# Patient Record
Sex: Female | Born: 1990 | Race: White | Hispanic: No | Marital: Married | Smoking: Never smoker
Health system: Southern US, Community
[De-identification: ages and names within clinical notes are randomized; demographics above are authoritative.]

## PROBLEM LIST (undated history)

## (undated) ENCOUNTER — Inpatient Hospital Stay (HOSPITAL_COMMUNITY): Payer: Self-pay

## (undated) DIAGNOSIS — F411 Generalized anxiety disorder: Secondary | ICD-10-CM

## (undated) DIAGNOSIS — O039 Complete or unspecified spontaneous abortion without complication: Secondary | ICD-10-CM

## (undated) DIAGNOSIS — N390 Urinary tract infection, site not specified: Secondary | ICD-10-CM

## (undated) DIAGNOSIS — F329 Major depressive disorder, single episode, unspecified: Secondary | ICD-10-CM

## (undated) DIAGNOSIS — F32A Depression, unspecified: Secondary | ICD-10-CM

## (undated) HISTORY — PX: LAPAROSCOPIC GASTRIC SLEEVE RESECTION: SHX5895

## (undated) HISTORY — PX: HERNIA REPAIR: SHX51

---
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
## 2011-08-07 ENCOUNTER — Emergency Department (HOSPITAL_COMMUNITY)
Admission: EM | Admit: 2011-08-07 | Discharge: 2011-08-08 | Disposition: A | Discharge status: Home / Self Care | Payer: Medicaid Other | Attending: Emergency Medicine | Admitting: Emergency Medicine

## 2011-08-07 ENCOUNTER — Encounter (HOSPITAL_COMMUNITY): Payer: Self-pay | Admitting: *Deleted

## 2011-08-07 DIAGNOSIS — R5383 Other fatigue: Secondary | ICD-10-CM | POA: Insufficient documentation

## 2011-08-07 DIAGNOSIS — B379 Candidiasis, unspecified: Secondary | ICD-10-CM | POA: Insufficient documentation

## 2011-08-07 DIAGNOSIS — N390 Urinary tract infection, site not specified: Secondary | ICD-10-CM

## 2011-08-07 DIAGNOSIS — R10819 Abdominal tenderness, unspecified site: Secondary | ICD-10-CM | POA: Insufficient documentation

## 2011-08-07 DIAGNOSIS — N739 Female pelvic inflammatory disease, unspecified: Secondary | ICD-10-CM

## 2011-08-07 DIAGNOSIS — Z79899 Other long term (current) drug therapy: Secondary | ICD-10-CM | POA: Insufficient documentation

## 2011-08-07 DIAGNOSIS — R197 Diarrhea, unspecified: Secondary | ICD-10-CM | POA: Insufficient documentation

## 2011-08-07 DIAGNOSIS — N898 Other specified noninflammatory disorders of vagina: Secondary | ICD-10-CM | POA: Insufficient documentation

## 2011-08-07 DIAGNOSIS — R300 Dysuria: Secondary | ICD-10-CM | POA: Insufficient documentation

## 2011-08-07 DIAGNOSIS — R109 Unspecified abdominal pain: Secondary | ICD-10-CM | POA: Insufficient documentation

## 2011-08-07 DIAGNOSIS — R112 Nausea with vomiting, unspecified: Secondary | ICD-10-CM | POA: Insufficient documentation

## 2011-08-07 DIAGNOSIS — R5381 Other malaise: Secondary | ICD-10-CM | POA: Insufficient documentation

## 2011-08-07 DIAGNOSIS — N949 Unspecified condition associated with female genital organs and menstrual cycle: Secondary | ICD-10-CM | POA: Insufficient documentation

## 2011-08-07 LAB — COMPREHENSIVE METABOLIC PANEL
AST: 29 U/L (ref 0–37)
Albumin: 4 g/dL (ref 3.5–5.2)
Alkaline Phosphatase: 73 U/L (ref 39–117)
Chloride: 101 mEq/L (ref 96–112)
Potassium: 3.2 mEq/L — ABNORMAL LOW (ref 3.5–5.1)
Total Bilirubin: 0.3 mg/dL (ref 0.3–1.2)
Total Protein: 7.7 g/dL (ref 6.0–8.3)

## 2011-08-07 LAB — URINALYSIS, ROUTINE W REFLEX MICROSCOPIC
Glucose, UA: NEGATIVE mg/dL
Hgb urine dipstick: NEGATIVE
Ketones, ur: 15 mg/dL — AB
Nitrite: NEGATIVE
Protein, ur: NEGATIVE mg/dL

## 2011-08-07 LAB — CBC
Hemoglobin: 13.5 g/dL (ref 12.0–15.0)
MCHC: 33.1 g/dL (ref 30.0–36.0)
Platelets: 312 10*3/uL (ref 150–400)
RBC: 5.21 MIL/uL — ABNORMAL HIGH (ref 3.87–5.11)
WBC: 14.9 10*3/uL — ABNORMAL HIGH (ref 4.0–10.5)

## 2011-08-07 LAB — DIFFERENTIAL
Basophils Absolute: 0.1 10*3/uL (ref 0.0–0.1)
Basophils Relative: 0 % (ref 0–1)
Neutro Abs: 11.5 10*3/uL — ABNORMAL HIGH (ref 1.7–7.7)
Neutrophils Relative %: 77 % (ref 43–77)

## 2011-08-07 LAB — URINE MICROSCOPIC-ADD ON

## 2011-08-07 LAB — WET PREP, GENITAL: Trich, Wet Prep: NONE SEEN

## 2011-08-07 MED ORDER — AZITHROMYCIN 250 MG PO TABS
1000.0000 mg | ORAL_TABLET | Freq: Once | ORAL | Status: AC
  Administered 2011-08-08: 1000 mg via ORAL
  Filled 2011-08-07: qty 4

## 2011-08-07 MED ORDER — ONDANSETRON HCL 4 MG/2ML IJ SOLN
4.0000 mg | Freq: Once | INTRAMUSCULAR | Status: AC
  Administered 2011-08-07: 4 mg via INTRAVENOUS
  Filled 2011-08-07: qty 2

## 2011-08-07 MED ORDER — METRONIDAZOLE 500 MG PO TABS
2000.0000 mg | ORAL_TABLET | Freq: Once | ORAL | Status: AC
  Administered 2011-08-08: 2000 mg via ORAL
  Filled 2011-08-07: qty 4

## 2011-08-07 MED ORDER — FLUCONAZOLE 150 MG PO TABS
150.0000 mg | ORAL_TABLET | ORAL | Status: AC
  Administered 2011-08-08: 150 mg via ORAL
  Filled 2011-08-07: qty 1

## 2011-08-07 MED ORDER — DEXTROSE 5 % IV SOLN
1.0000 g | Freq: Once | INTRAVENOUS | Status: AC
  Administered 2011-08-07: 1 g via INTRAVENOUS
  Filled 2011-08-07: qty 10

## 2011-08-07 MED ORDER — SODIUM CHLORIDE 0.9 % IV BOLUS (SEPSIS)
1000.0000 mL | Freq: Once | INTRAVENOUS | Status: AC
  Administered 2011-08-07: 1000 mL via INTRAVENOUS

## 2011-08-07 NOTE — ED Provider Notes (Signed)
Medical screening examination/treatment/procedure(s) were conducted as a shared visit with non-physician practitioner(s) and myself.  I personally evaluated the patient during the encounter   Jumaane Weatherford, MD 08/07/11 2356 

## 2011-08-07 NOTE — ED Provider Notes (Signed)
I performed pelvic exam for Dr. Manus Gunning. Pt has normal external genetalia. Vaginal canal irritated, white cottage cheese like discharge. White cervical discharge.  CMT. Cervix friable. Uternine tenderness, right adnexal tenderness.   Lottie Mussel, PA 08/07/11 2349

## 2011-08-07 NOTE — ED Provider Notes (Signed)
History     CSN: [id number]  Arrival date & time 08/07/11  2204   First MD Initiated Contact with Patient 08/07/11 2225      Chief Complaint  Patient presents with  . Abdominal Pain    (Consider location/radiation/quality/duration/timing/severity/associated sxs/prior treatment) HPI Comments: Patient presents with 5 days of lower abdominal pain, nausea, vomiting diarrhea. She states she's been looking for the past 5 days her last vomiting episode yesterday. She has seen some blood in her diarrhea. She denies any vaginal bleeding, discharge, dysuria or hematuria. She denies any fever. Her last period was last week. She has no appetite today.  The history is provided by the patient.    History reviewed. No pertinent past medical history.  Past Surgical History  Procedure Date  . Hernia repair     No family history on file.  History  Substance Use Topics  . Smoking status: Not on file  . Smokeless tobacco: Not on file  . Alcohol Use: No    OB History    Grav Para Term Preterm Abortions TAB SAB Ect Mult Living                  Review of Systems  Constitutional: Positive for activity change, appetite change and fatigue. Negative for fever.  HENT: Negative for congestion and rhinorrhea.   Respiratory: Negative for cough and shortness of breath.   Cardiovascular: Negative for chest pain.  Gastrointestinal: Positive for nausea, vomiting, abdominal pain and diarrhea.  Genitourinary: Positive for dysuria and vaginal discharge. Negative for vaginal bleeding.  Musculoskeletal: Negative for back pain.  Skin: Negative for rash.  Neurological: Negative for headaches.    Allergies  Abilify and Sulfa antibiotics  Home Medications   Current Outpatient Rx  Name Route Sig Dispense Refill  . DULOXETINE HCL 60 MG PO CPEP Oral Take 60 mg by mouth every morning.    Marland Kitchen CIPROFLOXACIN HCL 500 MG PO TABS Oral Take 1 tablet (500 mg total) by mouth every 12 (twelve) hours. 6 tablet 0  .  PHENAZOPYRIDINE HCL 200 MG PO TABS Oral Take 1 tablet (200 mg total) by mouth 3 (three) times daily. 6 tablet 0    BP 105/64  Pulse 87  Resp 12  SpO2 100%  LMP 07/17/2011  Physical Exam  Constitutional: She is oriented to person, place, and time. She appears well-developed and well-nourished. No distress.  HENT:  Head: Normocephalic and atraumatic.  Mouth/Throat: Oropharynx is clear and moist. No oropharyngeal exudate.  Eyes: Conjunctivae and EOM are normal. Pupils are equal, round, and reactive to light.  Neck: Normal range of motion.  Cardiovascular: Normal rate, regular rhythm and normal heart sounds.   Pulmonary/Chest: Effort normal and breath sounds normal. No respiratory distress.  Abdominal: Soft. There is tenderness. There is no rebound and no guarding.       TTP RLQ with guarding, TTP suprapubic  Musculoskeletal: Normal range of motion.  Neurological: She is alert and oriented to person, place, and time. No cranial nerve deficit.  Skin: Skin is warm.    ED Course  Procedures (including critical care time)  Labs Reviewed  URINALYSIS, ROUTINE W REFLEX MICROSCOPIC - Abnormal; Notable for the following:    APPearance CLOUDY (*)    Bilirubin Urine SMALL (*)    Ketones, ur 15 (*)    Leukocytes, UA MODERATE (*)    All other components within normal limits  WET PREP, GENITAL - Abnormal; Notable for the following:    WBC, Wet  Prep HPF POC TOO NUMEROUS TO COUNT (*)    All other components within normal limits  CBC - Abnormal; Notable for the following:    WBC 14.9 (*)    RBC 5.21 (*)    MCH 25.9 (*)    RDW 15.8 (*)    All other components within normal limits  DIFFERENTIAL - Abnormal; Notable for the following:    Neutro Abs 11.5 (*)    All other components within normal limits  COMPREHENSIVE METABOLIC PANEL - Abnormal; Notable for the following:    Potassium 3.2 (*)    All other components within normal limits  URINE MICROSCOPIC-ADD ON - Abnormal; Notable for the  following:    Squamous Epithelial / LPF FEW (*)    Bacteria, UA MANY (*)    Casts HYALINE CASTS (*)    All other components within normal limits  LIPASE, BLOOD  PREGNANCY, URINE  POCT PREGNANCY, URINE  GC/CHLAMYDIA PROBE AMP, GENITAL   US Transvaginal Non-ob  08/08/2011  *RADIOLOGY REPORT*  Clinical Data: Right-sided mid pelvis pain  TRANSABDOMINAL AND TRANSVAGINAL ULTRASOUND OF PELVIS Technique:  Both transabdominal and transvaginal ultrasound examinations of the pelvis were performed. Transabdominal technique was performed for global imaging of the pelvis including uterus, ovaries, adnexal regions, and pelvic cul-de-sac.  Comparison: None.   It was necessary to proceed with endovaginal exam following the transabdominal exam to visualize the endometrium and adnexa.  Findings:  Uterus: Normal sonographic appearance measuring 8.0 x 3.8 x 5.1 cm.  Endometrium: Measures 1.4 cm in thickness, within normal limits.  Right ovary:  Measures 3.1 x 2.0 x 2.7 cm.  Physiologic cyst.  Left ovary: Measures 2.8 x 1.2 x 1.6 cm.  Normal sonographic appearance.  Other findings: Small amount of free fluid within the left adnexa. This is most often a physiologic finding.  Color Doppler flow documented to both ovaries.  IMPRESSION: Normal study. No evidence of pelvic mass or other significant abnormality.  Original Report Authenticated By: Waneta Martins, M.D.   US Pelvis Complete  08/08/2011  *RADIOLOGY REPORT*  Clinical Data: Right-sided mid pelvis pain  TRANSABDOMINAL AND TRANSVAGINAL ULTRASOUND OF PELVIS Technique:  Both transabdominal and transvaginal ultrasound examinations of the pelvis were performed. Transabdominal technique was performed for global imaging of the pelvis including uterus, ovaries, adnexal regions, and pelvic cul-de-sac.  Comparison: None.   It was necessary to proceed with endovaginal exam following the transabdominal exam to visualize the endometrium and adnexa.  Findings:  Uterus: Normal  sonographic appearance measuring 8.0 x 3.8 x 5.1 cm.  Endometrium: Measures 1.4 cm in thickness, within normal limits.  Right ovary:  Measures 3.1 x 2.0 x 2.7 cm.  Physiologic cyst.  Left ovary: Measures 2.8 x 1.2 x 1.6 cm.  Normal sonographic appearance.  Other findings: Small amount of free fluid within the left adnexa. This is most often a physiologic finding.  Color Doppler flow documented to both ovaries.  IMPRESSION: Normal study. No evidence of pelvic mass or other significant abnormality.  Original Report Authenticated By: Waneta Martins, M.D.   Ct Abdomen Pelvis W Contrast  08/08/2011  *RADIOLOGY REPORT*  Clinical Data: Mid/lower abdominal pain.  CT ABDOMEN AND PELVIS WITH CONTRAST  Technique:  Multidetector CT imaging of the abdomen and pelvis was performed following the standard protocol during bolus administration of intravenous contrast.  Contrast: 40mL OMNIPAQUE IOHEXOL 300 MG/ML IV SOLN, OMNIPAQUE IOHEXOL 300 MG/ML IV SOLN  Comparison: 08/08/2011 ultrasound  Findings: Limited images through the lung bases  demonstrate no significant appreciable abnormality. The heart size is within normal limits. No pleural or pericardial effusion.  Unremarkable liver, biliary system, pancreas, adrenal glands. There is a 3.5 cm cyst within the lower pole of the spleen, measures water attenuation.  Numerous additional smaller hypodensities within the splenic dome are too small to further characterize.  Symmetric renal enhancement.  No hydronephrosis or hydroureter.  No urinary tract calculi identified.  No bowel obstruction. Sigmoid colon is mildly thickened however decompressed, limiting evaluation. While normally I would favor this to represent an incidental finding, there are a couple reactive size mesorectal lymph nodes nearby. Otherwise, no CT evidence for colitis. No free intraperitoneal air.  Normal caliber vasculature.  Corpus luteal cyst noted on the right.  Trace free fluid within the pelvis.  Minimal fat stranding abuts the left adnexa (series 2 image 74).  CT appearance to the uterus and adnexa otherwise within normal limits.  Partially decompressed bladder.  No acute osseous abnormality.  IMPRESSION: Mild sigmoid colonic wall thickening is nonspecific given incomplete distension. Correlate clinically if concern for a mild colitis.  Minimal fat stranding abutting the left adnexa. No hydrosalpinx or loculated fluid collection.  Otherwise, no acute CT abnormality.  Original Report Authenticated By: Waneta Martins, M.D.     1. Pelvic inflammatory disease   2. Urinary tract infection   3. Candidiasis       MDM  Lower abdominal pain with nausea, vomiting diarrhea. No history of fever or chills. Patient does endorse anorexia. Abdomen is soft but tender in the right lower quadrant suprapubic area  Pelvic exam performed by [facility].  See her separate note.  Empiric treatment for PID, UTI, candidiasis.  CT abdomen to evaluate appendix and TVUS to evaluate R ovary.  Signed out to Dr. Nino Parsley at shift change.        Glynn Octave, MD 08/08/11 1146

## 2011-08-07 NOTE — ED Notes (Signed)
Pt is here for evaluation of abdominal pain and n/v and diarrhea for 5 days.  Pt would like to be checked for a yeast infection also, she reports swelling and redness in vaginal area.  Pt denies any pain or burning with urination.  Pt denies any fever or chills.

## 2011-08-08 ENCOUNTER — Emergency Department (HOSPITAL_COMMUNITY): Payer: Medicaid Other

## 2011-08-08 MED ORDER — PHENAZOPYRIDINE HCL 200 MG PO TABS: 200.0000 mg | ORAL_TABLET | Freq: Three times a day (TID) | ORAL | Status: AC

## 2011-08-08 MED ORDER — IOHEXOL 300 MG/ML  SOLN
40.0000 mL | Freq: Once | INTRAMUSCULAR | Status: AC | PRN
  Administered 2011-08-08: 40 mL via ORAL

## 2011-08-08 MED ORDER — IOHEXOL 300 MG/ML  SOLN
20.0000 mL | INTRAMUSCULAR | Status: AC
Start: 2011-08-08 — End: 2011-08-08

## 2011-08-08 MED ORDER — IOHEXOL 300 MG/ML  SOLN
100.0000 mL | Freq: Once | INTRAMUSCULAR | Status: AC | PRN
  Administered 2011-08-08: 100 mL via INTRAVENOUS

## 2011-08-08 MED ORDER — CIPROFLOXACIN HCL 500 MG PO TABS: 500.0000 mg | ORAL_TABLET | Freq: Two times a day (BID) | ORAL | Status: AC

## 2011-08-08 NOTE — ED Notes (Signed)
Pt is in ultrasound at this time

## 2011-08-08 NOTE — ED Provider Notes (Addendum)
Assumed care from Dr. Manus Gunning.  Need to check ct and [place] and then reassess pt. She has been txd for pid and uti.   Nicholes Stairs, MD 08/08/11 0036  Reevaluated pt. Comfortable.  No pain.  Explained findings and plan. She understands and agrees.  Does not need note for work or school.  Nicholes Stairs, MD 08/08/11 [phone]

## 2011-10-21 ENCOUNTER — Emergency Department (HOSPITAL_COMMUNITY): Payer: Medicaid Other

## 2011-10-21 ENCOUNTER — Encounter (HOSPITAL_COMMUNITY): Payer: Self-pay | Admitting: *Deleted

## 2011-10-21 ENCOUNTER — Emergency Department (HOSPITAL_COMMUNITY)
Admission: EM | Admit: 2011-10-21 | Discharge: 2011-10-21 | Disposition: A | Discharge status: Home / Self Care | Payer: Medicaid Other | Attending: Emergency Medicine | Admitting: Emergency Medicine

## 2011-10-21 DIAGNOSIS — R112 Nausea with vomiting, unspecified: Secondary | ICD-10-CM | POA: Insufficient documentation

## 2011-10-21 DIAGNOSIS — R109 Unspecified abdominal pain: Secondary | ICD-10-CM | POA: Insufficient documentation

## 2011-10-21 DIAGNOSIS — Z331 Pregnant state, incidental: Secondary | ICD-10-CM

## 2011-10-21 LAB — CBC
HCT: 38 % (ref 36.0–46.0)
Hemoglobin: 12.8 g/dL (ref 12.0–15.0)
MCH: 27.1 pg (ref 26.0–34.0)
MCHC: 33.7 g/dL (ref 30.0–36.0)
MCV: 80.3 fL (ref 78.0–100.0)
RBC: 4.73 MIL/uL (ref 3.87–5.11)

## 2011-10-21 LAB — BASIC METABOLIC PANEL
BUN: 6 mg/dL (ref 6–23)
CO2: 24 mEq/L (ref 19–32)
Calcium: 9.4 mg/dL (ref 8.4–10.5)
Creatinine, Ser: 0.63 mg/dL (ref 0.50–1.10)
GFR calc non Af Amer: >90 mL/min (ref >90)
Glucose, Bld: 73 mg/dL (ref 70–99)
Sodium: 137 mEq/L (ref 135–145)

## 2011-10-21 LAB — RH IG WORKUP (INCLUDES ABO/RH)
ABO/RH(D): O NEG
Antibody Screen: NEGATIVE
Unit division: 0

## 2011-10-21 LAB — URINALYSIS, ROUTINE W REFLEX MICROSCOPIC
Bilirubin Urine: NEGATIVE
Glucose, UA: NEGATIVE mg/dL
Ketones, ur: NEGATIVE mg/dL
Leukocytes, UA: NEGATIVE
Protein, ur: NEGATIVE mg/dL

## 2011-10-21 MED ORDER — ONDANSETRON 8 MG PO TBDP
8.0000 mg | ORAL_TABLET | Freq: Three times a day (TID) | ORAL | Status: AC | PRN
Start: 2011-10-21 — End: 2011-10-28

## 2011-10-21 NOTE — ED Notes (Signed)
To ED for eval of lower abd pain and vomiting for the past cple of weeks. C/o vaginal discharge.

## 2011-10-21 NOTE — ED Provider Notes (Addendum)
History     CSN: [id number]  Arrival date & time 10/21/11  1711   First MD Initiated Contact with Patient 10/21/11 1741      Chief Complaint  Patient presents with  . Abdominal Pain    (Consider location/radiation/quality/duration/timing/severity/associated sxs/prior treatment) The history is provided by the patient.   the patient reports intermittent lower abdominal pain for several weeks.  She's had nausea and several episodes of vomiting.  She called her OB/GYN for followup today and her OB/GYN said she would not evaluate her for abdominal pain.  Chest the patient to be evaluated in the emergency department if she was having symptoms.  She's not checked a pregnancy test at home.  She denies diarrhea.  This time she has no abdominal pain.  She reports her last normal menstrual period was 09/15/2011.  Since then she's had no abnormal vaginal bleeding.  Her pain is mild to moderate when it comes on at this time her pain is 0.  Denies dysuria or urinary frequency.  She reports a subtle change in her typical vaginal discharge  History reviewed. No pertinent past medical history.  Past Surgical History  Procedure Date  . Hernia repair     History reviewed. No pertinent family history.  History  Substance Use Topics  . Smoking status: Not on file  . Smokeless tobacco: Not on file  . Alcohol Use: No    OB History    Grav Para Term Preterm Abortions TAB SAB Ect Mult Living                  Review of Systems  Gastrointestinal: Positive for abdominal pain.  All other systems reviewed and are negative.    Allergies  Abilify and Sulfa antibiotics  Home Medications   Current Outpatient Rx  Name Route Sig Dispense Refill  . SERTRALINE HCL 25 MG PO TABS Oral Take 25 mg by mouth 2 (two) times daily.      BP 112/60  Pulse 96  Temp(Src) 98 F (36.7 C) (Oral)  Resp 16  SpO2 99%  Physical Exam  Nursing note and vitals reviewed. Constitutional: She is oriented to person,  place, and time. She appears well-developed and well-nourished. No distress.  HENT:  Head: Normocephalic and atraumatic.  Eyes: EOM are normal.  Neck: Normal range of motion.  Cardiovascular: Normal rate, regular rhythm and normal heart sounds.   Pulmonary/Chest: Effort normal and breath sounds normal.  Abdominal: Soft. She exhibits no distension. There is no tenderness.  Musculoskeletal: Normal range of motion.  Neurological: She is alert and oriented to person, place, and time.  Skin: Skin is warm and dry.  Psychiatric: She has a normal mood and affect. Judgment normal.    ED Course  Procedures (including critical care time)  Labs Reviewed  URINALYSIS, ROUTINE W REFLEX MICROSCOPIC - Abnormal; Notable for the following:    Specific Gravity, Urine 1.031 (*)    All other components within normal limits  POCT PREGNANCY, URINE - Abnormal; Notable for the following:    Preg Test, Ur POSITIVE (*)    All other components within normal limits  CBC - Abnormal; Notable for the following:    RDW 15.7 (*)    All other components within normal limits  HCG, QUANTITATIVE, PREGNANCY - Abnormal; Notable for the following:    hCG, Beta Chain, Quant, S 562 (*)    All other components within normal limits  BASIC METABOLIC PANEL  ABO/RH  RH IG WORKUP  US Ob Comp Less 14 Wks  10/21/2011  *RADIOLOGY REPORT*  Clinical Data: Pelvic pain, positive pregnancy test  OBSTETRIC <14 WK [place] AND TRANSVAGINAL OB US  Technique:  Both transabdominal and transvaginal ultrasound examinations were performed for complete evaluation of the gestation as well as the maternal uterus, adnexal regions, and pelvic cul-de-sac.  Transvaginal technique was performed to assess early pregnancy.  Comparison:  Pelvic ultrasound 08/08/2011, CT abdomen/pelvis 08/08/2011  Intrauterine gestational sac:  Not visualized Yolk sac: Not visualized Embryo: Not visualized Cardiac Activity: Not visualized  Maternal uterus/adnexae: The ovaries are  normal.  No free fluid.  IMPRESSION: No intrauterine gestational sac, yolk sac, fetal pole, or cardiac activity visualized. Differential considerations include intrauterine gestation too early to be sonographically visualized, spontaneous abortion, or ectopic pregnancy.  Consider follow-up ultrasound in 10 days and serial quantitative beta HCG follow-up.  Original Report Authenticated By: Harrel Lemon, M.D.   US Ob Transvaginal  10/21/2011  *RADIOLOGY REPORT*  Clinical Data: Pelvic pain, positive pregnancy test  OBSTETRIC <14 WK [place] AND TRANSVAGINAL OB US  Technique:  Both transabdominal and transvaginal ultrasound examinations were performed for complete evaluation of the gestation as well as the maternal uterus, adnexal regions, and pelvic cul-de-sac.  Transvaginal technique was performed to assess early pregnancy.  Comparison:  Pelvic ultrasound 08/08/2011, CT abdomen/pelvis 08/08/2011  Intrauterine gestational sac:  Not visualized Yolk sac: Not visualized Embryo: Not visualized Cardiac Activity: Not visualized  Maternal uterus/adnexae: The ovaries are normal.  No free fluid.  IMPRESSION: No intrauterine gestational sac, yolk sac, fetal pole, or cardiac activity visualized. Differential considerations include intrauterine gestation too early to be sonographically visualized, spontaneous abortion, or ectopic pregnancy.  Consider follow-up ultrasound in 10 days and serial quantitative beta HCG follow-up.  Original Report Authenticated By: Harrel Lemon, M.D.     1. Nausea and vomiting   2. Pregnancy as incidental finding   3. Abdominal pain       MDM  Patient is a positive urine pregnancy test.  Blood work will be obtained.  IV placed.  She'll obtain an ultrasound to evaluate location of pregnancy.  She has no vaginal bleeding.  Her vital signs are normal.  My suspicion for an ectopic is low.  7:54 PM The pt feels better. No vaginal bleeding. Quant is 562.   [place] indeterminate. Close gyn  follow up. Strict early ectopic precautions given. She understands to return to [facility] hospital for development of worsening abdominal pain or development of vaginal bleeding or lightheadedness or syncope.      Lyanne Co, MD 10/21/11 1821  Lyanne Co, MD 10/21/11 Corky Crafts

## 2011-10-21 NOTE — ED Notes (Signed)
Patient verbalized complete understanding of all the d/c home instructions 

## 2011-10-21 NOTE — Discharge Instructions (Signed)
Abdominal Pain During Pregnancy °Belly (abdominal) pain is common during pregnancy. Most of the time, it is not a serious problem. Other times, it can be a sign that something is wrong with the pregnancy. Always tell your doctor if you have belly pain. °HOME CARE °For mild pain: °· Do not have sex (intercourse) or put anything in your vagina until you feel better.  °· Rest until your pain stops. If your pain lasts longer than 1 hour, call your doctor.  °· Drink clear fluids if you feel sick to your stomach (nauseous).  °· Do not eat solid food until you feel better.  °· Only take medicine as told by your doctor.  °· Keep all doctor visits as told.  °GET HELP RIGHT AWAY IF:  °· You are bleeding, leaking fluid, or pieces of tissue come out of your vagina.  °· You have more pain or cramping.  °· You keep throwing up (vomiting).  °· You have pain when you pee (urinate) or have blood in your pee.  °· You have a fever.  °· You do not feel your baby moving as much.  °· You feel very weak or feel like passing out.  °· You have trouble breathing, with or without belly pain.  °· You have a very bad headache and belly pain.  °· You have fluid leaking from your vagina and belly pain.  °· You keep having watery poop (diarrhea).  °· Your belly pain does not go away after resting, or the pain gets worse.  °MAKE SURE YOU:  °· Understand these instructions.  °· Will watch your condition.  °· Will get help right away if you are not doing well or get worse.  °Document Released: 06/05/2009 Document Revised: 06/06/2011 Document Reviewed: 01/11/2011 °[employer]® Patient Information ©2012 [employer]. °

## 2011-10-21 NOTE — ED Notes (Signed)
Patient back from US.

## 2011-11-21 ENCOUNTER — Encounter (HOSPITAL_COMMUNITY): Payer: Self-pay | Admitting: *Deleted

## 2011-11-21 ENCOUNTER — Emergency Department (HOSPITAL_COMMUNITY)
Admission: EM | Admit: 2011-11-21 | Discharge: 2011-11-21 | Disposition: A | Discharge status: Home / Self Care | Payer: Medicaid Other | Attending: Emergency Medicine | Admitting: Emergency Medicine

## 2011-11-21 DIAGNOSIS — L0291 Cutaneous abscess, unspecified: Secondary | ICD-10-CM

## 2011-11-21 DIAGNOSIS — Z711 Person with feared health complaint in whom no diagnosis is made: Secondary | ICD-10-CM | POA: Insufficient documentation

## 2011-11-21 DIAGNOSIS — N764 Abscess of vulva: Secondary | ICD-10-CM | POA: Insufficient documentation

## 2011-11-21 DIAGNOSIS — N898 Other specified noninflammatory disorders of vagina: Secondary | ICD-10-CM | POA: Insufficient documentation

## 2011-11-21 DIAGNOSIS — N390 Urinary tract infection, site not specified: Secondary | ICD-10-CM | POA: Insufficient documentation

## 2011-11-21 LAB — WET PREP, GENITAL
Trich, Wet Prep: NONE SEEN
Yeast Wet Prep HPF POC: NONE SEEN

## 2011-11-21 LAB — URINE MICROSCOPIC-ADD ON

## 2011-11-21 LAB — URINALYSIS, ROUTINE W REFLEX MICROSCOPIC
Bilirubin Urine: NEGATIVE
Glucose, UA: NEGATIVE mg/dL
Hgb urine dipstick: NEGATIVE
Ketones, ur: NEGATIVE mg/dL
pH: 7 (ref 5.0–8.0)

## 2011-11-21 MED ORDER — AZITHROMYCIN 250 MG PO TABS
1000.0000 mg | ORAL_TABLET | Freq: Once | ORAL | Status: AC
  Administered 2011-11-21: 1000 mg via ORAL
  Filled 2011-11-21: qty 4

## 2011-11-21 MED ORDER — LIDOCAINE HCL (PF) 1 % IJ SOLN
INTRAMUSCULAR | Status: AC
  Administered 2011-11-21: 20:00:00
  Filled 2011-11-21: qty 5

## 2011-11-21 MED ORDER — CEFTRIAXONE SODIUM 250 MG IJ SOLR
250.0000 mg | Freq: Once | INTRAMUSCULAR | Status: AC
  Administered 2011-11-21: 250 mg via INTRAMUSCULAR
  Filled 2011-11-21: qty 250

## 2011-11-21 MED ORDER — FLUCONAZOLE 200 MG PO TABS: 200.0000 mg | ORAL_TABLET | Freq: Every day | ORAL | Status: AC

## 2011-11-21 MED ORDER — SILVER SULFADIAZINE 1 % EX CREA
TOPICAL_CREAM | Freq: Once | CUTANEOUS | Status: AC
  Administered 2011-11-21: 1 via TOPICAL
  Filled 2011-11-21: qty 85

## 2011-11-21 MED ORDER — CIPROFLOXACIN HCL 500 MG PO TABS: 500.0000 mg | ORAL_TABLET | Freq: Two times a day (BID) | ORAL | Status: AC

## 2011-11-21 NOTE — ED Provider Notes (Signed)
Medical screening examination/treatment/procedure(s) were performed by non-physician practitioner and as supervising physician I was immediately available for consultation/collaboration.  Doug Sou, MD 11/21/11 2102

## 2011-11-21 NOTE — Discharge Instructions (Signed)
Abscess An abscess (boil or furuncle) is an infected area that contains a collection of pus.  SYMPTOMS Signs and symptoms of an abscess include pain, tenderness, redness, or hardness. You may feel a moveable soft area under your skin. An abscess can occur anywhere in the body.  TREATMENT  A surgical cut (incision) may be made over your abscess to drain the pus. Gauze may be packed into the space or a drain may be looped through the abscess cavity (pocket). This provides a drain that will allow the cavity to heal from the inside outwards. The abscess may be painful for a few days, but should feel much better if it was drained.  Your abscess, if seen early, may not have localized and may not have been drained. If not, another appointment may be required if it does not get better on its own or with medications. HOME CARE INSTRUCTIONS   Only take over-the-counter or prescription medicines for pain, discomfort, or fever as directed by your caregiver.   Take your antibiotics as directed if they were prescribed. Finish them even if you start to feel better.   Keep the skin and clothes clean around your abscess.   If the abscess was drained, you will need to use gauze dressing to collect any draining pus. Dressings will typically need to be changed 3 or more times a day.   The infection may spread by skin contact with others. Avoid skin contact as much as possible.   Practice good hygiene. This includes regular hand washing, cover any draining skin lesions, and do not share personal care items.   If you participate in sports, do not share athletic equipment, towels, whirlpools, or personal care items. Shower after every practice or tournament.   If a draining area cannot be adequately covered:   Do not participate in sports.   Children should not participate in day care until the wound has healed or drainage stops.   If your caregiver has given you a follow-up appointment, it is very important  to keep that appointment. Not keeping the appointment could result in a much worse infection, chronic or permanent injury, pain, and disability. If there is any problem keeping the appointment, you must call back to this facility for assistance.  SEEK MEDICAL CARE IF:   You develop increased pain, swelling, redness, drainage, or bleeding in the wound site.   You develop signs of generalized infection including muscle aches, chills, fever, or a general ill feeling.   You have an oral temperature above 102 F (38.9 C).  MAKE SURE YOU:   Understand these instructions.   Will watch your condition.   Will get help right away if you are not doing well or get worse.  Document Released: 03/27/2005 Document Revised: 06/06/2011 Document Reviewed: 01/19/2008 [facility] Patient Information 2012 [place].Cellulitis Cellulitis is an infection of the skin and the tissue beneath it. The area is typically red and tender. It is caused by germs (bacteria) (usually staph or strep) that enter the body through cuts or sores. Cellulitis most commonly occurs in the arms or lower legs.  HOME CARE INSTRUCTIONS   If you are given a prescription for medications which kill germs (antibiotics), take as directed until finished.   If the infection is on the arm or leg, keep the limb elevated as able.   Use a warm cloth several times per day to relieve pain and encourage healing.   See your caregiver for recheck of the infected site as  directed if problems arise.   Only take over-the-counter or prescription medicines for pain, discomfort, or fever as directed by your caregiver.  SEEK MEDICAL CARE IF:   The area of redness (inflammation) is spreading, there are red streaks coming from the infected site, or if a part of the infection begins to turn dark in color.   The joint or bone underneath the infected skin becomes painful after the skin has healed.   The infection returns in the same or another area after  it seems to have gone away.   A boil or bump swells up. This may be an abscess.   New, unexplained problems such as pain or fever develop.  SEEK IMMEDIATE MEDICAL CARE IF:   You have a fever.   You or your child feels drowsy or lethargic.   There is vomiting, diarrhea, or lasting discomfort or feeling ill (malaise) with muscle aches and pains.  MAKE SURE YOU:   Understand these instructions.   Will watch your condition.   Will get help right away if you are not doing well or get worse.  Document Released: 03/27/2005 Document Revised: 06/06/2011 Document Reviewed: 02/03/2008 [facility] Patient Information 2012 [place], [place].Urinary Tract Infection Infections of the urinary tract can start in several places. A bladder infection (cystitis), a kidney infection (pyelonephritis), and a prostate infection (prostatitis) are different types of urinary tract infections (UTIs). They usually get better if treated with medicines (antibiotics) that kill germs. Take all the medicine until it is gone. You or your child may feel better in a few days, but TAKE ALL MEDICINE or the infection may not respond and may become more difficult to treat. HOME CARE INSTRUCTIONS   Drink enough water and fluids to keep the urine clear or pale yellow. Cranberry juice is especially recommended, in addition to large amounts of water.   Avoid caffeine, tea, and carbonated beverages. They tend to irritate the bladder.   Alcohol may irritate the prostate.   Only take over-the-counter or prescription medicines for pain, discomfort, or fever as directed by your caregiver.  To prevent further infections:  Empty the bladder often. Avoid holding urine for long periods of time.   After a bowel movement, women should cleanse from front to back. Use each tissue only once.   Empty the bladder before and after sexual intercourse.  FINDING OUT THE RESULTS OF YOUR TEST Not all test results are available during your visit. If  your or your child's test results are not back during the visit, make an appointment with your caregiver to find out the results. Do not assume everything is normal if you have not heard from your caregiver or the medical facility. It is important for you to follow up on all test results. SEEK MEDICAL CARE IF:   There is back pain.   Your baby is older than 3 months with a rectal temperature of 100.5 F (38.1 C) or higher for more than 1 day.   Your or your child's problems (symptoms) are no better in 3 days. Return sooner if you or your child is getting worse.  SEEK IMMEDIATE MEDICAL CARE IF:   There is severe back pain or lower abdominal pain.   You or your child develops chills.   You have a fever.   Your baby is older than 3 months with a rectal temperature of 102 F (38.9 C) or higher.   Your baby is 9 months old or younger with a rectal temperature of 100.4 F (  38 C) or higher.   There is nausea or vomiting.   There is continued burning or discomfort with urination.  MAKE SURE YOU:   Understand these instructions.   Will watch your condition.   Will get help right away if you are not doing well or get worse.  Document Released: 03/27/2005 Document Revised: 06/06/2011 Document Reviewed: 10/30/2006 [facility] Patient Information 2012 [place], [place].Urinary Tract Infection Infections of the urinary tract can start in several places. A bladder infection (cystitis), a kidney infection (pyelonephritis), and a prostate infection (prostatitis) are different types of urinary tract infections (UTIs). They usually get better if treated with medicines (antibiotics) that kill germs. Take all the medicine until it is gone. You or your child may feel better in a few days, but TAKE ALL MEDICINE or the infection may not respond and may become more difficult to treat. HOME CARE INSTRUCTIONS   Drink enough water and fluids to keep the urine clear or pale yellow. Cranberry juice is especially  recommended, in addition to large amounts of water.   Avoid caffeine, tea, and carbonated beverages. They tend to irritate the bladder.   Alcohol may irritate the prostate.   Only take over-the-counter or prescription medicines for pain, discomfort, or fever as directed by your caregiver.  To prevent further infections:  Empty the bladder often. Avoid holding urine for long periods of time.   After a bowel movement, women should cleanse from front to back. Use each tissue only once.   Empty the bladder before and after sexual intercourse.  FINDING OUT THE RESULTS OF YOUR TEST Not all test results are available during your visit. If your or your child's test results are not back during the visit, make an appointment with your caregiver to find out the results. Do not assume everything is normal if you have not heard from your caregiver or the medical facility. It is important for you to follow up on all test results. SEEK MEDICAL CARE IF:   There is back pain.   Your baby is older than 3 months with a rectal temperature of 100.5 F (38.1 C) or higher for more than 1 day.   Your or your child's problems (symptoms) are no better in 3 days. Return sooner if you or your child is getting worse.  SEEK IMMEDIATE MEDICAL CARE IF:   There is severe back pain or lower abdominal pain.   You or your child develops chills.   You have a fever.   Your baby is older than 3 months with a rectal temperature of 102 F (38.9 C) or higher.   Your baby is 69 months old or younger with a rectal temperature of 100.4 F (38 C) or higher.   There is nausea or vomiting.   There is continued burning or discomfort with urination.  MAKE SURE YOU:   Understand these instructions.   Will watch your condition.   Will get help right away if you are not doing well or get worse.  Document Released: 03/27/2005 Document Revised: 06/06/2011 Document Reviewed: 10/30/2006 [facility] Patient Information 2012  [place], [place].

## 2011-11-21 NOTE — ED Provider Notes (Signed)
History     CSN: [id number]  Arrival date & time 11/21/11  1345   First MD Initiated Contact with Patient 11/21/11 1632      Chief Complaint  Patient presents with  . Vaginal Discharge    (Consider location/radiation/quality/duration/timing/severity/associated sxs/prior treatment) HPI  Patient presents to the ED with complaints of vaginal discharge for two weeks and scratches to her labia from scratching so much due to the vaginal discharge. She is now having puss draining from the scratches. The patient states that she has not had sex in 3 weeks after having had a miscarriage. She denies having any pain, abdominal pain, urinary symptoms, weakness, fever, nausea, vomiting for diarrhea. The patient denies having any chronic medical problems - such as diabetes.   History reviewed. No pertinent past medical history.  Past Surgical History  Procedure Date  . Hernia repair     No family history on file.  History  Substance Use Topics  . Smoking status: Never Smoker   . Smokeless tobacco: Not on file  . Alcohol Use: No    OB History    Grav Para Term Preterm Abortions TAB SAB Ect Mult Living                  Review of Systems   HEENT: denies blurry vision or change in hearing PULMONARY: Denies difficulty breathing and SOB CARDIAC: denies chest pain or heart palpitations MUSCULOSKELETAL:  denies being unable to ambulate ABDOMEN AL: denies abdominal pain GU: denies loss of bowel or urinary control NEURO: denies numbness and tingling in extremities   Allergies  Aripiprazole and Sulfa antibiotics  Home Medications   Current Outpatient Rx  Name Route Sig Dispense Refill  . SERTRALINE HCL 25 MG PO TABS Oral Take 25 mg by mouth 2 (two) times daily.    Marland Kitchen CIPROFLOXACIN HCL 500 MG PO TABS Oral Take 1 tablet (500 mg total) by mouth 2 (two) times daily. 28 tablet 0    BP 128/84  Pulse 105  Temp(Src) 98.4 F (36.9 C) (Oral)  Resp 18  SpO2 99%  LMP  11/07/2011  Physical Exam  Nursing note and vitals reviewed. Constitutional: She appears well-developed and well-nourished. No distress.  HENT:  Head: Normocephalic and atraumatic.  Eyes: Pupils are equal, round, and reactive to light.  Neck: Normal range of motion. Neck supple.  Cardiovascular: Normal rate and regular rhythm.   Pulmonary/Chest: Effort normal.  Abdominal: Soft. She exhibits no distension. There is no tenderness. There is no rebound and no guarding.  Genitourinary:    There is no tenderness or injury on the right labia. There is tenderness (to abscess) and injury (scratch) on the left labia. No erythema, tenderness or bleeding around the vagina. No foreign body around the vagina. No signs of injury around the vagina. Vaginal discharge found.  Neurological: She is alert.  Skin: Skin is warm and dry.    ED Course  Procedures (including critical care time)  Labs Reviewed  URINALYSIS, ROUTINE W REFLEX MICROSCOPIC - Abnormal; Notable for the following:    APPearance CLOUDY (*)    Leukocytes, UA MODERATE (*)    All other components within normal limits  URINE MICROSCOPIC-ADD ON - Abnormal; Notable for the following:    Squamous Epithelial / LPF FEW (*)    Bacteria, UA FEW (*)    All other components within normal limits  WET PREP, GENITAL - Abnormal; Notable for the following:    WBC, Wet Prep HPF POC TOO NUMEROUS  TO COUNT (*)    All other components within normal limits  POCT PREGNANCY, URINE  GC/CHLAMYDIA PROBE AMP, GENITAL   No results found.   1. UTI (lower urinary tract infection)   2. Abscess   3. Concern about STD in female without diagnosis       MDM  Patients wet prep showed large amount of WBC with out clue cells, yeast, or trich.  Her urinalysis shows Leukocytes.   Patient treated with Azithromycin and Rocephin in ED to cover to Gonorrhea and chlamydia. I have informed her that if her cultures are positive she will get a call in approx 3  days.  Patient also has draining abscess and UTI . Will prescribe Cipro to treat and Diflucan to prevent yeast infection since she is prone to them without antibiotics.  Pt has been advised of the symptoms that warrant their return to the ED. Patient has voiced understanding and has agreed to follow-up with the PCP or specialist.        Dorthula Matas, PA 11/21/11 1911

## 2011-11-21 NOTE — ED Notes (Signed)
Pt is here with yeast infection 2 weeks and now with sores from scratching and states they are green now

## 2011-12-20 ENCOUNTER — Emergency Department (HOSPITAL_COMMUNITY)
Admission: EM | Admit: 2011-12-20 | Discharge: 2011-12-20 | Disposition: A | Discharge status: Home / Self Care | Payer: Medicaid Other | Attending: Emergency Medicine | Admitting: Emergency Medicine

## 2011-12-20 ENCOUNTER — Encounter (HOSPITAL_COMMUNITY): Payer: Self-pay | Admitting: *Deleted

## 2011-12-20 DIAGNOSIS — H109 Unspecified conjunctivitis: Secondary | ICD-10-CM

## 2011-12-20 MED ORDER — ERYTHROMYCIN 5 MG/GM OP OINT
TOPICAL_OINTMENT | Freq: Once | OPHTHALMIC | Status: AC
  Administered 2011-12-20: 14:00:00 via OPHTHALMIC
  Filled 2011-12-20: qty 1

## 2011-12-20 NOTE — ED Provider Notes (Signed)
History    This chart was scribed for Cheri Guppy, MD by Sofie Rower. The patient was seen in room TR02C/TR02C and the patient's care was started at 1:50 PM    CSN: [id number]  Arrival date & time 12/20/11  1245   First MD Initiated Contact with Patient 12/20/11 1349      Chief Complaint  Patient presents with  . Eye Pain    (Consider location/radiation/quality/duration/timing/severity/associated sxs/prior treatment) Patient is a 21 y.o. female presenting with eye pain. The history is limited by the absence of a caregiver.  Eye Pain This is a new problem. The current episode started 3 to 5 hours ago. The problem occurs constantly. The problem has not changed since onset.Pertinent negatives include no headaches. Nothing aggravates the symptoms.   Pt denies wearing contacts or glasses.  She complains of left eye pain, and redness.  No foreign body sensation.  Vision is normal.  No history of trauma Past Surgical History  Procedure Date  . Hernia repair      History  Substance Use Topics  . Smoking status: Never Smoker   . Smokeless tobacco: Not on file  . Alcohol Use: No    OB History    Grav Para Term Preterm Abortions TAB SAB Ect Mult Living                  Review of Systems  Constitutional: Negative for fever.  Eyes: Positive for pain and redness. Negative for discharge and visual disturbance.  Gastrointestinal: Negative for nausea and vomiting.  Neurological: Negative for headaches.    Allergies  Aripiprazole and Sulfa antibiotics  Home Medications   Current Outpatient Rx  Name Route Sig Dispense Refill  . SERTRALINE HCL 25 MG PO TABS Oral Take 25 mg by mouth 2 (two) times daily.      BP 111/71  Pulse 85  Temp 97.4 F (36.3 C) (Oral)  Resp 12  SpO2 100%  LMP 11/07/2011  Physical Exam  Nursing note and vitals reviewed. Constitutional: She is oriented to person, place, and time. She appears well-developed and well-nourished.  HENT:  Head:  Normocephalic and atraumatic.  Eyes: EOM are normal. Pupils are equal, round, and reactive to light. Right eye exhibits no discharge. Left eye exhibits no discharge.       Conjunctival injection on the left side.   Neck: Normal range of motion.  Pulmonary/Chest: Effort normal.  Musculoskeletal: Normal range of motion.  Neurological: She is alert and oriented to person, place, and time.  Skin: Skin is warm and dry. No rash noted. No erythema.  Psychiatric: She has a normal mood and affect. Her behavior is normal.    ED Course  Procedures (including critical care time)  Labs Reviewed - No data to display No results found.   No diagnosis found.    MDM  Conjunctivitis No contact use.  No decreased visual acuity      I personally performed the services described in this documentation, which was scribed in my presence. The recorded information has been reviewed and considered.    Cheri Guppy, MD 12/20/11 [phone]

## 2011-12-20 NOTE — ED Notes (Signed)
Lt. Eye pain, itchy, sclera redness, swelling of upper eye lid. Vision blurry in lt. Eye.

## 2011-12-20 NOTE — Discharge Instructions (Signed)
Conjunctivitis Conjunctivitis is commonly called "pink eye." Conjunctivitis can be caused by bacterial or viral infection, allergies, or injuries. There is usually redness of the lining of the eye, itching, discomfort, and sometimes discharge. There may be deposits of matter along the eyelids. A viral infection usually causes a watery discharge, while a bacterial infection causes a yellowish, thick discharge. Pink eye is very contagious and spreads by direct contact. You may be given antibiotic eyedrops as part of your treatment. Before using your eye medicine, remove all drainage from the eye by washing gently with warm water and cotton balls. Continue to use the medication until you have awakened 2 mornings in a row without discharge from the eye. Do not rub your eye. This increases the irritation and helps spread infection. Use separate towels from other household members. Wash your hands with soap and water before and after touching your eyes. Use cold compresses to reduce pain and sunglasses to relieve irritation from light. Do not wear contact lenses or wear eye makeup until the infection is gone. SEEK MEDICAL CARE IF:   Your symptoms are not better after 3 days of treatment.   You have increased pain or trouble seeing.   The outer eyelids become very red or swollen.  Document Released: 07/25/2004 Document Revised: 06/06/2011 Document Reviewed: 06/17/2005 [facility] Patient Information 2012 [place], [place].  Use of erythromycin ointment in your left eye 3 times a day for the next 3 days.  Followup with an ophthalmologist if your symptoms persist after using the antibiotic.  Return for worse or uncontrolled symptoms.  He may take ibuprofen 600 mg every 6 hours for pain

## 2012-07-01 DIAGNOSIS — T1491XA Suicide attempt, initial encounter: Secondary | ICD-10-CM

## 2012-07-01 HISTORY — DX: Suicide attempt, initial encounter: T14.91XA

## 2012-10-20 ENCOUNTER — Encounter (HOSPITAL_COMMUNITY): Payer: Self-pay | Admitting: *Deleted

## 2012-10-20 ENCOUNTER — Emergency Department (HOSPITAL_COMMUNITY)
Admission: EM | Admit: 2012-10-20 | Discharge: 2012-10-21 | Disposition: A | Discharge status: Home / Self Care | Payer: Medicaid Other | Attending: Emergency Medicine | Admitting: Emergency Medicine

## 2012-10-20 DIAGNOSIS — F3289 Other specified depressive episodes: Secondary | ICD-10-CM | POA: Insufficient documentation

## 2012-10-20 DIAGNOSIS — N898 Other specified noninflammatory disorders of vagina: Secondary | ICD-10-CM | POA: Insufficient documentation

## 2012-10-20 DIAGNOSIS — R109 Unspecified abdominal pain: Secondary | ICD-10-CM | POA: Insufficient documentation

## 2012-10-20 DIAGNOSIS — F329 Major depressive disorder, single episode, unspecified: Secondary | ICD-10-CM | POA: Insufficient documentation

## 2012-10-20 DIAGNOSIS — R45851 Suicidal ideations: Secondary | ICD-10-CM | POA: Insufficient documentation

## 2012-10-20 DIAGNOSIS — F32A Depression, unspecified: Secondary | ICD-10-CM

## 2012-10-20 HISTORY — DX: Complete or unspecified spontaneous abortion without complication: O03.9

## 2012-10-20 HISTORY — DX: Depression, unspecified: F32.A

## 2012-10-20 HISTORY — DX: Major depressive disorder, single episode, unspecified: F32.9

## 2012-10-20 LAB — URINALYSIS, ROUTINE W REFLEX MICROSCOPIC
Bilirubin Urine: NEGATIVE
Glucose, UA: NEGATIVE mg/dL
Hgb urine dipstick: NEGATIVE
Specific Gravity, Urine: 1.014 (ref 1.005–1.030)
Urobilinogen, UA: 0.2 mg/dL (ref 0.0–1.0)
pH: 5.5 (ref 5.0–8.0)

## 2012-10-20 LAB — CBC WITH DIFFERENTIAL/PLATELET
Basophils Relative: 0 % (ref 0–1)
Eosinophils Absolute: 0.3 10*3/uL (ref 0.0–0.7)
HCT: 39.5 % (ref 36.0–46.0)
Hemoglobin: 13.7 g/dL (ref 12.0–15.0)
Lymphs Abs: 3.8 10*3/uL (ref 0.7–4.0)
MCH: 29.7 pg (ref 26.0–34.0)
MCHC: 34.7 g/dL (ref 30.0–36.0)
Monocytes Absolute: 0.9 10*3/uL (ref 0.1–1.0)
Monocytes Relative: 7 % (ref 3–12)
Neutro Abs: 6.6 10*3/uL (ref 1.7–7.7)
RBC: 4.61 MIL/uL (ref 3.87–5.11)

## 2012-10-20 LAB — RAPID URINE DRUG SCREEN, HOSP PERFORMED
Cocaine: NOT DETECTED
Opiates: NOT DETECTED
Tetrahydrocannabinol: NOT DETECTED

## 2012-10-20 MED ORDER — ALUM & MAG HYDROXIDE-SIMETH 200-200-20 MG/5ML PO SUSP
15.0000 mL | Freq: Once | ORAL | Status: AC
Start: 2012-10-20 — End: 2012-10-21
  Administered 2012-10-21: 15 mL via ORAL
  Filled 2012-10-20: qty 30

## 2012-10-20 NOTE — ED Provider Notes (Signed)
History     CSN: [id number]  Arrival date & time 10/20/12  2304   First MD Initiated Contact with Patient 10/20/12 2324      No chief complaint on file.   (Consider location/radiation/quality/duration/timing/severity/associated sxs/prior treatment) HPI Comments: This is a 22 year old female, who states she with her boyfriend, over her mother, who she does not live with and has an estranged relationship with states she took 30 over the counter ibuprofen in the spur of the moment, thinking.  She was go to sleep, and not wake up in the morning at this time.  She requests this decision.  She states she recently had a miscarriage, and [facility] for a [facility]&C in the morning.  Her onto she lives with is at the bedside and is not expressing any concern for the patient's safety.  If discharged home.  She has had some outpatient counseling in the past.  Never been hospitalized has never tried hurting herself or  The history is provided by the patient.    Past Medical History  Diagnosis Date  . Miscarriage   . Depression     Past Surgical History  Procedure Laterality Date  . Hernia repair      No family history on file.  History  Substance Use Topics  . Smoking status: Never Smoker   . Smokeless tobacco: Not on file  . Alcohol Use: No    OB History   Grav Para Term Preterm Abortions TAB SAB Ect Mult Living                  Review of Systems  Constitutional: Negative for fever and chills.  Gastrointestinal: Positive for abdominal pain. Negative for nausea, vomiting and diarrhea.  Genitourinary: Positive for vaginal bleeding and menstrual problem. Negative for dysuria.  Skin: Negative for wound.  Psychiatric/Behavioral: Positive for suicidal ideas.  All other systems reviewed and are negative.    Allergies  Aripiprazole and Sulfa antibiotics  Home Medications   Current Outpatient Rx  Name  Route  Sig  Dispense  Refill  . ibuprofen  (ADVIL,MOTRIN) 200 MG tablet   Oral   Take 200 mg by mouth every 6 (six) hours as needed for pain.           BP 118/77  Pulse 75  Temp(Src) 98.2 F (36.8 C) (Oral)  Resp 18  SpO2 100%  LMP 07/15/2012  Physical Exam  Constitutional: She is oriented to person, place, and time. She appears well-developed and well-nourished.  HENT:  Head: Normocephalic.  Eyes: Pupils are equal, round, and reactive to light.  Neck: Normal range of motion.  Pulmonary/Chest: Effort normal.  Abdominal: Soft. Bowel sounds are normal.  Musculoskeletal: Normal range of motion.  Neurological: She is alert and oriented to person, place, and time.  Skin: Skin is warm. No pallor.    ED Course  Procedures (including critical care time)  Labs Reviewed  CBC WITH DIFFERENTIAL - Abnormal; Notable for the following:    WBC 11.7 (*)    All other components within normal limits  COMPREHENSIVE METABOLIC PANEL - Abnormal; Notable for the following:    Potassium 3.3 (*)    Glucose, Bld 113 (*)    Total Bilirubin 0.2 (*)    All other components within normal limits  SALICYLATE LEVEL - Abnormal; Notable for the following:    Salicylate Lvl <2.0 (*)    All other components within normal limits  URINE RAPID DRUG SCREEN (  HOSP PERFORMED)  URINALYSIS, ROUTINE W REFLEX MICROSCOPIC  ACETAMINOPHEN LEVEL  ETHANOL   No results found.   1. Depression       MDM   Patient has been medically cleared to get him to psych to complete her evaluation Patient has telephonic consultation diagnosed with depression deemed safe for discharge home with community mental health followup       Arman Filter, NP 10/21/12 0033  Arman Filter, NP 10/21/12 4782  Arman Filter, NP 10/21/12 0306  Arman Filter, NP 10/21/12 9562

## 2012-10-20 NOTE — ED Notes (Signed)
Bed:WA24<BR> Expected date:<BR> Expected time:<BR> Means of arrival:<BR> Comments:<BR> EMS

## 2012-10-20 NOTE — ED Notes (Signed)
Per EMS report: Pt attempted suicide with 30 200mg  ibuprofen.  Pt got into a verbal altercation with boyfriend and took the medication.  EMS VS: BP 131/68, HR: 82, 98%RA

## 2012-10-21 LAB — COMPREHENSIVE METABOLIC PANEL
Albumin: 3.7 g/dL (ref 3.5–5.2)
BUN: 6 mg/dL (ref 6–23)
Chloride: 106 mEq/L (ref 96–112)
Creatinine, Ser: 0.6 mg/dL (ref 0.50–1.10)
GFR calc Af Amer: >90 mL/min (ref >90)
Glucose, Bld: 113 mg/dL — ABNORMAL HIGH (ref 70–99)
Total Bilirubin: 0.2 mg/dL — ABNORMAL LOW (ref 0.3–1.2)

## 2012-10-21 LAB — SALICYLATE LEVEL: Salicylate Lvl: <2 mg/dL — ABNORMAL LOW (ref 2.8–20.0)

## 2012-10-21 LAB — ACETAMINOPHEN LEVEL: Acetaminophen (Tylenol), Serum: <15 ug/mL (ref 10–30)

## 2012-10-21 LAB — ETHANOL: Alcohol, Ethyl (B): <11 mg/dL (ref 0–11)

## 2012-10-21 MED ORDER — NICOTINE 21 MG/24HR TD PT24: 21.0000 mg | MEDICATED_PATCH | Freq: Every day | TRANSDERMAL | Status: DC

## 2012-10-21 MED ORDER — ONDANSETRON HCL 4 MG PO TABS
4.0000 mg | ORAL_TABLET | Freq: Three times a day (TID) | ORAL | Status: DC | PRN
Start: 2012-10-21 — End: 2012-10-21

## 2012-10-21 MED ORDER — POTASSIUM CHLORIDE CRYS ER 20 MEQ PO TBCR
40.0000 meq | EXTENDED_RELEASE_TABLET | Freq: Once | ORAL | Status: AC
  Administered 2012-10-21: 40 meq via ORAL
  Filled 2012-10-21: qty 2

## 2012-10-21 NOTE — ED Provider Notes (Signed)
Medical screening examination/treatment/procedure(s) were performed by non-physician practitioner and as supervising physician I was immediately available for consultation/collaboration.  Narcisa Ganesh M Bianco Cange, MD 10/21/12 0407 

## 2012-10-21 NOTE — ED Notes (Signed)
Patient arrived to unit; no s/s of distress noted. Upon entering room, pt asked this RN "So when can I leave and when do I get my clothes?". RN explained to patient that she would be assessed by a psychiatrist and then he will determine whether or not she will stay or go. Pt irritable.

## 2015-03-12 ENCOUNTER — Emergency Department (HOSPITAL_COMMUNITY)
Admission: EM | Admit: 2015-03-12 | Discharge: 2015-03-13 | Disposition: A | Discharge status: Home / Self Care | Payer: Self-pay | Attending: Emergency Medicine | Admitting: Emergency Medicine

## 2015-03-12 ENCOUNTER — Encounter (HOSPITAL_COMMUNITY): Payer: Self-pay | Admitting: Nurse Practitioner

## 2015-03-12 DIAGNOSIS — E669 Obesity, unspecified: Secondary | ICD-10-CM | POA: Insufficient documentation

## 2015-03-12 DIAGNOSIS — Z8659 Personal history of other mental and behavioral disorders: Secondary | ICD-10-CM | POA: Insufficient documentation

## 2015-03-12 DIAGNOSIS — R1012 Left upper quadrant pain: Secondary | ICD-10-CM | POA: Insufficient documentation

## 2015-03-12 DIAGNOSIS — R110 Nausea: Secondary | ICD-10-CM

## 2015-03-12 DIAGNOSIS — R112 Nausea with vomiting, unspecified: Secondary | ICD-10-CM | POA: Insufficient documentation

## 2015-03-12 LAB — LIPASE, BLOOD: Lipase: 24 U/L (ref 22–51)

## 2015-03-12 LAB — CBC
HCT: 44.9 % (ref 36.0–46.0)
Hemoglobin: 15.5 g/dL — ABNORMAL HIGH (ref 12.0–15.0)
MCH: 30.6 pg (ref 26.0–34.0)
MCHC: 34.5 g/dL (ref 30.0–36.0)
MCV: 88.7 fL (ref 78.0–100.0)
PLATELETS: 317 10*3/uL (ref 150–400)
RBC: 5.06 MIL/uL (ref 3.87–5.11)
RDW: 12.8 % (ref 11.5–15.5)
WBC: 14.2 10*3/uL — ABNORMAL HIGH (ref 4.0–10.5)

## 2015-03-12 LAB — COMPREHENSIVE METABOLIC PANEL
ALK PHOS: 71 U/L (ref 38–126)
ALT: 19 U/L (ref 14–54)
AST: 23 U/L (ref 15–41)
Albumin: 3.9 g/dL (ref 3.5–5.0)
Anion gap: 8 (ref 5–15)
BUN: 7 mg/dL (ref 6–20)
CALCIUM: 9.3 mg/dL (ref 8.9–10.3)
CHLORIDE: 104 mmol/L (ref 101–111)
CO2: 25 mmol/L (ref 22–32)
CREATININE: 0.81 mg/dL (ref 0.44–1.00)
GFR calc non Af Amer: >60 mL/min (ref >60)
Glucose, Bld: 80 mg/dL (ref 65–99)
Potassium: 3.8 mmol/L (ref 3.5–5.1)
SODIUM: 137 mmol/L (ref 135–145)
Total Bilirubin: 0.6 mg/dL (ref 0.3–1.2)
Total Protein: 7.5 g/dL (ref 6.5–8.1)

## 2015-03-12 LAB — URINALYSIS, ROUTINE W REFLEX MICROSCOPIC
BILIRUBIN URINE: NEGATIVE
GLUCOSE, UA: NEGATIVE mg/dL
HGB URINE DIPSTICK: NEGATIVE
KETONES UR: NEGATIVE mg/dL
LEUKOCYTES UA: NEGATIVE
Nitrite: NEGATIVE
PH: 6 (ref 5.0–8.0)
PROTEIN: NEGATIVE mg/dL
Specific Gravity, Urine: 1.024 (ref 1.005–1.030)
Urobilinogen, UA: 1 mg/dL (ref 0.0–1.0)

## 2015-03-12 LAB — I-STAT BETA HCG BLOOD, ED (MC, WL, AP ONLY): I-stat hCG, quantitative: <5 m[IU]/mL (ref <5)

## 2015-03-12 MED ORDER — ONDANSETRON 4 MG PO TBDP
4.0000 mg | ORAL_TABLET | Freq: Once | ORAL | Status: AC
  Administered 2015-03-12: 4 mg via ORAL
  Filled 2015-03-12: qty 1

## 2015-03-12 MED ORDER — GI COCKTAIL ~~LOC~~
30.0000 mL | Freq: Once | ORAL | Status: AC
  Administered 2015-03-12: 30 mL via ORAL
  Filled 2015-03-12: qty 30

## 2015-03-12 NOTE — ED Notes (Signed)
She c/o LLQ abd pain, increased with food intake, increasingly worse over past 2 weeks. Today she began to have vomiting. Denies bowel/bladder changes, denies vaginal bleeding or discharge.

## 2015-03-12 NOTE — ED Provider Notes (Signed)
CSN: [id number]     Arrival date & time 03/12/15  1726 History   First MD Initiated Contact with Patient 03/12/15 2055     Chief Complaint  Patient presents with  . Abdominal Pain    Kristen Padilla is a 24 y.o. female with a history of depression and obesity who presents to the ED complaining of left upper quadrant abdominal pain that is worse with eating ongoing for the past 3 weeks. The patient reports that she has pain instantly after eating recently. She reports her pain as a 9 out of 10 and describes it as sharp in her left upper quadrant currently. She reports having nausea earlier but none now. She reports that earlier today she vomited 3 times and this is the first time she's vomited with her pain. She denies any acid reflux symptoms. She reports having pain that fluctuates and is much worse after eating. She reports having a normal bowel movement today. She denies hematemesis. Her only surgical history includes a hernia repair as an infant. The patient denies fevers, chills, hematemesis, hematochezia, diarrhea, lower abdominal pain, vaginal bleeding, vaginal discharge, urinary symptoms, chest pain, shortness of breath, palpitations, or rashes.   (Consider location/radiation/quality/duration/timing/severity/associated sxs/prior Treatment) HPI  Past Medical History  Diagnosis Date  . Miscarriage   . Depression    Past Surgical History  Procedure Laterality Date  . Hernia repair     History reviewed. No pertinent family history. Social History  Substance Use Topics  . Smoking status: Never Smoker   . Smokeless tobacco: None  . Alcohol Use: No   OB History    No data available     Review of Systems  Constitutional: Negative for fever and chills.  HENT: Negative for congestion and sore throat.   Eyes: Negative for visual disturbance.  Respiratory: Negative for cough and shortness of breath.   Cardiovascular: Negative for chest pain and palpitations.   Gastrointestinal: Positive for nausea, vomiting and abdominal pain. Negative for diarrhea and blood in stool.  Genitourinary: Negative for dysuria, urgency, frequency, hematuria, vaginal bleeding and vaginal discharge.  Musculoskeletal: Negative for back pain and neck pain.  Skin: Negative for rash.  Neurological: Negative for light-headedness and headaches.      Allergies  Aripiprazole and Sulfa antibiotics  Home Medications   Prior to Admission medications   Medication Sig Start Date End Date Taking? Authorizing Provider  acetaminophen (TYLENOL) 500 MG tablet Take 1,000 mg by mouth every 6 (six) hours as needed for moderate pain.   Yes Historical Provider, MD  ibuprofen (ADVIL,MOTRIN) 200 MG tablet Take 200 mg by mouth every 6 (six) hours as needed for pain.    Historical Provider, MD  omeprazole (PRILOSEC) 20 MG capsule Take 1 capsule (20 mg total) by mouth daily. 03/13/15   Everlene Farrier, PA-C  ondansetron (ZOFRAN ODT) 4 MG disintegrating tablet Take 1 tablet (4 mg total) by mouth every 8 (eight) hours as needed for nausea or vomiting. 03/13/15   Everlene Farrier, PA-C   BP 118/68 mmHg  Pulse 78  Temp(Src) 98.1 F (36.7 C) (Oral)  Resp 16  Ht  (1.575 m)  Wt 242 lb 1.6 oz (109.816 kg)  BMI 44.27 kg/m2  SpO2 96%  LMP 03/04/2015 Physical Exam  Constitutional: She is oriented to person, place, and time. She appears well-developed and well-nourished. No distress.  Nontoxic appearing. Obese female.  HENT:  Head: Normocephalic and atraumatic.  Mouth/Throat: Oropharynx is clear and moist.  Eyes: Conjunctivae are normal.  Pupils are equal, round, and reactive to light. Right eye exhibits no discharge. Left eye exhibits no discharge.  Neck: Neck supple. No JVD present.  Cardiovascular: Normal rate, regular rhythm, normal heart sounds and intact distal pulses.  Exam reveals no gallop and no friction rub.   No murmur heard. Pulmonary/Chest: Effort normal and breath sounds normal.  No respiratory distress. She has no wheezes. She has no rales.  Abdominal: Soft. Bowel sounds are normal. She exhibits no distension and no mass. There is tenderness. There is no rebound and no guarding.  Abdomen is soft. Bowel sounds are present. Patient has mild left upper quadrant abdominal tenderness to palpation. No McBurney's point tenderness. No rebound tenderness. Negative psoas and obturator sign. No CVA or flank tenderness.  Musculoskeletal: She exhibits no edema or tenderness.  Lymphadenopathy:    She has no cervical adenopathy.  Neurological: She is alert and oriented to person, place, and time. Coordination normal.  Skin: Skin is warm and dry. No rash noted. She is not diaphoretic. No erythema. No pallor.  Psychiatric: She has a normal mood and affect. Her behavior is normal.  Nursing note and vitals reviewed.   ED Course  Procedures (including critical care time) Labs Review Labs Reviewed  CBC - Abnormal; Notable for the following:    WBC 14.2 (*)    Hemoglobin 15.5 (*)    All other components within normal limits  LIPASE, BLOOD  COMPREHENSIVE METABOLIC PANEL  URINALYSIS, ROUTINE W REFLEX MICROSCOPIC (NOT AT [facility])  I-STAT BETA HCG BLOOD, ED ([facility], [facility], [facility] ONLY)    Imaging Review Ct Abdomen Pelvis W Contrast  03/13/2015   CLINICAL DATA:  Left lower quadrant abdominal pain. Leukocytosis. Nausea and vomiting. Progressive over 2 weeks.  EXAM: CT ABDOMEN AND PELVIS WITH CONTRAST  TECHNIQUE: Multidetector CT imaging of the abdomen and pelvis was performed using the standard protocol following bolus administration of intravenous contrast.  CONTRAST:  OMNIPAQUE IOHEXOL 300 MG/ML  SOLN  COMPARISON:  Report from CT 03/16/2014, images not available  FINDINGS: Lower chest:  The included lung bases are clear.  Liver: Normal in size without focal lesion.  Hepatobiliary: Gallbladder physiologically distended, no biliary dilatation.  Pancreas: Normal.  Spleen: Normal in size. Small  cysts, largest in the lower pole measuring 2.6 cm of peripheral calcification, unchanged from prior report.  Adrenal glands: No nodule.  Kidneys: Symmetric renal enhancement.  No hydronephrosis.  Stomach/Bowel: Stomach physiologically distended. There are no dilated or thickened small bowel loops. Moderate volume of stool throughout the colon without colonic wall thickening. The appendix is normal.  Vascular/Lymphatic: No retroperitoneal adenopathy. Abdominal aorta is normal in caliber.  Reproductive: Uterus is normal in size. The ovaries are symmetric in size. No adnexal mass.  Bladder: Physiologically distended.  Other: No free air, free fluid, or intra-abdominal fluid collection. Small fat containing umbilical hernia.  Musculoskeletal: There are no acute or suspicious osseous abnormalities.  IMPRESSION: 1. No acute abnormality in the abdomen/pelvis. 2. Splenic cysts, unchanged.   Electronically Signed   By: Rubye Oaks M.D.   On: 03/13/2015 01:45   I have personally reviewed and evaluated these images and lab results as part of my medical decision-making.   EKG Interpretation None     Filed Vitals:   03/13/15 0115 03/13/15 0125 03/13/15 0130 03/13/15 0145  BP: 111/72  106/57 118/68  Pulse:  78 76 78  Temp:      TempSrc:      Resp:  Height:      Weight:      SpO2:  96% 96% 96%    MDM   Meds given in ED:  Medications  gi cocktail (Maalox,Lidocaine,Donnatal) (30 mLs Oral Given 03/12/15 2206)  ondansetron (ZOFRAN-ODT) disintegrating tablet 4 mg (4 mg Oral Given 03/12/15 2209)  iohexol (OMNIPAQUE) 300 MG/ML solution 100 mL (100 mLs Intravenous Contrast Given 03/13/15 0010)    New Prescriptions   OMEPRAZOLE (PRILOSEC) 20 MG CAPSULE    Take 1 capsule (20 mg total) by mouth daily.   ONDANSETRON (ZOFRAN ODT) 4 MG DISINTEGRATING TABLET    Take 1 tablet (4 mg total) by mouth every 8 (eight) hours as needed for nausea or vomiting.    Final diagnoses:  Left upper quadrant pain  Nausea    This is a 24 y.o. female with a history of depression and obesity who presents to the ED complaining of left upper quadrant abdominal pain that is worse with eating ongoing for the past 3 weeks. The patient reports that she has pain instantly after eating recently. She reports her pain as a 9 out of 10 and describes it as sharp in her left upper quadrant currently. She reports having nausea earlier but none now. She reports that earlier today she vomited 3 times and this is the first time she's vomited with her pain.    On exam patient is afebrile nontoxic appearing. Patient's abdomen is soft and she has mild left upper quadrant abdominal tenderness to palpation. No peritoneal signs. She is a negative i-STAT hCG. Urinalysis is unremarkable. Lipase is within normal limits. CMP is within normal limits. CBC is remarkable for a leukocytosis with a white count of 14.2. Due to patient's leukocytosis and abdominal pain will obtain CT abdomen and pelvis. The patient's pain greatly improved after Zofran and GI cocktail. She is sorry. Liquids in the emergency department without vomiting. CT abdomen and pelvis indicates no acute abnormality in the abdomen and pelvis. At third reevaluation the patient reports her pain is much improved. She reports feeling ready to be discharged. I suspect peptic ulcer. Will discharge with prescriptions for Prilosec and Zofran. I provided education on diet with a peptic ulcer. I advised the patient to follow-up with their primary care provider this week. I advised the patient to return to the emergency department with new or worsening symptoms or new concerns. The patient verbalized understanding and agreement with plan.    This patient was discussed with Dr. Madilyn Hook who agrees with assessment and plan.    Everlene Farrier, PA-C 03/13/15 1610  Tilden Fossa, MD 03/13/15 1315

## 2015-03-13 ENCOUNTER — Emergency Department (HOSPITAL_COMMUNITY): Payer: Medicaid Other

## 2015-03-13 ENCOUNTER — Encounter (HOSPITAL_COMMUNITY): Payer: Self-pay | Admitting: Radiology

## 2015-03-13 MED ORDER — OMEPRAZOLE 20 MG PO CPDR: 20.0000 mg | DELAYED_RELEASE_CAPSULE | Freq: Every day | ORAL | Status: DC

## 2015-03-13 MED ORDER — IOHEXOL 300 MG/ML  SOLN
100.0000 mL | Freq: Once | INTRAMUSCULAR | Status: AC | PRN
  Administered 2015-03-13: 100 mL via INTRAVENOUS

## 2015-03-13 MED ORDER — ONDANSETRON 4 MG PO TBDP: 4.0000 mg | ORAL_TABLET | Freq: Three times a day (TID) | ORAL | Status: DC | PRN

## 2015-03-13 NOTE — Discharge Instructions (Signed)
Peptic Ulcer A peptic ulcer is a sore in the lining of your esophagus (esophageal ulcer), stomach (gastric ulcer), or in the first part of your small intestine (duodenal ulcer). The ulcer causes erosion into the deeper tissue. CAUSES  Normally, the lining of the stomach and the small intestine protects itself from the acid that digests food. The protective lining can be damaged by:  An infection caused by a bacterium called Helicobacter pylori (H. pylori).  Regular use of nonsteroidal anti-inflammatory drugs (NSAIDs), such as ibuprofen or aspirin.  Smoking tobacco. Other risk factors include being older than 50, drinking alcohol excessively, and having a family history of ulcer disease.  SYMPTOMS   Burning pain or gnawing in the area between the chest and the belly button.  Heartburn.  Nausea and vomiting.  Bloating. The pain can be worse on an empty stomach and at night. If the ulcer results in bleeding, it can cause:  Black, tarry stools.  Vomiting of bright red blood.  Vomiting of coffee-ground-looking materials. DIAGNOSIS  A diagnosis is usually made based upon your history and an exam. Other tests and procedures may be performed to find the cause of the ulcer. Finding a cause will help determine the best treatment. Tests and procedures may include:  Blood tests, stool tests, or breath tests to check for the bacterium H. pylori.  An upper gastrointestinal (GI) series of the esophagus, stomach, and small intestine.  An endoscopy to examine the esophagus, stomach, and small intestine.  A biopsy. TREATMENT  Treatment may include:  Eliminating the cause of the ulcer, such as smoking, NSAIDs, or alcohol.  Medicines to reduce the amount of acid in your digestive tract.  Antibiotic medicines if the ulcer is caused by the H. pylori bacterium.  An upper endoscopy to treat a bleeding ulcer.  Surgery if the bleeding is severe or if the ulcer created a hole somewhere in the  digestive system. HOME CARE INSTRUCTIONS   Avoid tobacco, alcohol, and caffeine. Smoking can increase the acid in the stomach, and continued smoking will impair the healing of ulcers.  Avoid foods and drinks that seem to cause discomfort or aggravate your ulcer.  Only take medicines as directed by your caregiver. Do not substitute over-the-counter medicines for prescription medicines without talking to your caregiver.  Keep any follow-up appointments and tests as directed. SEEK MEDICAL CARE IF:   Your do not improve within 7 days of starting treatment.  You have ongoing indigestion or heartburn. SEEK IMMEDIATE MEDICAL CARE IF:   You have sudden, sharp, or persistent abdominal pain.  You have bloody or dark black, tarry stools.  You vomit blood or vomit that looks like coffee grounds.  You become light-headed, weak, or feel faint.  You become sweaty or clammy. MAKE SURE YOU:   Understand these instructions.  Will watch your condition.  Will get help right away if you are not doing well or get worse. Document Released: 06/14/2000 Document Revised: 11/01/2013 Document Reviewed: 01/15/2012 [facility] Patient Information 2015 [place], [place]. This information is not intended to replace advice given to you by your health care provider. Make sure you discuss any questions you have with your health care provider. Food Choices for Peptic Ulcer Disease When you have peptic ulcer disease, the foods you eat and your eating habits are very important. Choosing the right foods can help ease the discomfort of peptic ulcer disease. WHAT GENERAL GUIDELINES DO I NEED TO FOLLOW?  Choose fruits, vegetables, whole grains, and low-fat meat, fish,  and poultry.   Keep a food diary to identify foods that cause symptoms.  Avoid foods that cause irritation or pain. These may be different for different people.  Eat frequent small meals instead of three large meals each day. The pain may be worse when  your stomach is empty.  Avoid eating close to bedtime. WHAT FOODS ARE NOT RECOMMENDED? The following are some foods and drinks that may worsen your symptoms:  Black, white, and red pepper.  Hot sauce.  Chili peppers.  Chili powder.  Chocolate and cocoa.   Alcohol.  Tea, coffee, and cola (regular and decaffeinated). The items listed above may not be a complete list of foods and beverages to avoid. Contact your dietitian for more information. Document Released: 09/09/2011 Document Revised: 06/22/2013 Document Reviewed: 04/21/2013 [facility] Patient Information 2015 [place], [place]. This information is not intended to replace advice given to you by your health care provider. Make sure you discuss any questions you have with your health care provider.  Abdominal Pain Many things can cause abdominal pain. Usually, abdominal pain is not caused by a disease and will improve without treatment. It can often be observed and treated at home. Your health care provider will do a physical exam and possibly order blood tests and X-rays to help determine the seriousness of your pain. However, in many cases, more time must pass before a clear cause of the pain can be found. Before that point, your health care provider may not know if you need more testing or further treatment. HOME CARE INSTRUCTIONS  Monitor your abdominal pain for any changes. The following actions may help to alleviate any discomfort you are experiencing:  Only take over-the-counter or prescription medicines as directed by your health care provider.  Do not take laxatives unless directed to do so by your health care provider.  Try a clear liquid diet (broth, tea, or water) as directed by your health care provider. Slowly move to a bland diet as tolerated. SEEK MEDICAL CARE IF:  You have unexplained abdominal pain.  You have abdominal pain associated with nausea or diarrhea.  You have pain when you urinate or have a bowel  movement.  You experience abdominal pain that wakes you in the night.  You have abdominal pain that is worsened or improved by eating food.  You have abdominal pain that is worsened with eating fatty foods.  You have a fever. SEEK IMMEDIATE MEDICAL CARE IF:   Your pain does not go away within 2 hours.  You keep throwing up (vomiting).  Your pain is felt only in portions of the abdomen, such as the right side or the left lower portion of the abdomen.  You pass bloody or black tarry stools. MAKE SURE YOU:  Understand these instructions.   Will watch your condition.   Will get help right away if you are not doing well or get worse.  Document Released: 03/27/2005 Document Revised: 06/22/2013 Document Reviewed: 02/24/2013 [facility] Patient Information 2015 [place], [place]. This information is not intended to replace advice given to you by your health care provider. Make sure you discuss any questions you have with your health care provider.

## 2015-03-13 NOTE — ED Notes (Signed)
Pt stable, ambulatory, states understanding of discharge instructions 

## 2016-01-03 ENCOUNTER — Emergency Department (HOSPITAL_COMMUNITY)
Admission: EM | Admit: 2016-01-03 | Discharge: 2016-01-03 | Disposition: A | Discharge status: Home / Self Care | Payer: Self-pay | Attending: Emergency Medicine | Admitting: Emergency Medicine

## 2016-01-03 ENCOUNTER — Emergency Department (HOSPITAL_COMMUNITY): Payer: Self-pay

## 2016-01-03 ENCOUNTER — Encounter (HOSPITAL_COMMUNITY): Payer: Self-pay | Admitting: Emergency Medicine

## 2016-01-03 DIAGNOSIS — B379 Candidiasis, unspecified: Secondary | ICD-10-CM | POA: Insufficient documentation

## 2016-01-03 DIAGNOSIS — R109 Unspecified abdominal pain: Secondary | ICD-10-CM

## 2016-01-03 DIAGNOSIS — R197 Diarrhea, unspecified: Secondary | ICD-10-CM | POA: Insufficient documentation

## 2016-01-03 DIAGNOSIS — B372 Candidiasis of skin and nail: Secondary | ICD-10-CM

## 2016-01-03 LAB — COMPREHENSIVE METABOLIC PANEL
ALK PHOS: 64 U/L (ref 38–126)
ALT: 22 U/L (ref 14–54)
ANION GAP: 8 (ref 5–15)
AST: 20 U/L (ref 15–41)
Albumin: 3.7 g/dL (ref 3.5–5.0)
BUN: 11 mg/dL (ref 6–20)
CALCIUM: 9.3 mg/dL (ref 8.9–10.3)
CO2: 23 mmol/L (ref 22–32)
Chloride: 108 mmol/L (ref 101–111)
Creatinine, Ser: 0.92 mg/dL (ref 0.44–1.00)
GFR calc non Af Amer: >60 mL/min (ref >60)
Glucose, Bld: 107 mg/dL — ABNORMAL HIGH (ref 65–99)
Potassium: 3.8 mmol/L (ref 3.5–5.1)
SODIUM: 139 mmol/L (ref 135–145)
TOTAL PROTEIN: 7.1 g/dL (ref 6.5–8.1)
Total Bilirubin: 0.5 mg/dL (ref 0.3–1.2)

## 2016-01-03 LAB — CBC
HCT: 42.6 % (ref 36.0–46.0)
HEMOGLOBIN: 14.3 g/dL (ref 12.0–15.0)
MCH: 30 pg (ref 26.0–34.0)
MCHC: 33.6 g/dL (ref 30.0–36.0)
MCV: 89.5 fL (ref 78.0–100.0)
Platelets: 349 10*3/uL (ref 150–400)
RBC: 4.76 MIL/uL (ref 3.87–5.11)
RDW: 12.7 % (ref 11.5–15.5)
WBC: 13.1 10*3/uL — ABNORMAL HIGH (ref 4.0–10.5)

## 2016-01-03 LAB — URINALYSIS, ROUTINE W REFLEX MICROSCOPIC
Bilirubin Urine: NEGATIVE
Glucose, UA: NEGATIVE mg/dL
Ketones, ur: NEGATIVE mg/dL
Leukocytes, UA: NEGATIVE
NITRITE: NEGATIVE
Protein, ur: NEGATIVE mg/dL
SPECIFIC GRAVITY, URINE: 1.034 — AB (ref 1.005–1.030)
pH: 5 (ref 5.0–8.0)

## 2016-01-03 LAB — LIPASE, BLOOD: Lipase: 22 U/L (ref 11–51)

## 2016-01-03 LAB — URINE MICROSCOPIC-ADD ON

## 2016-01-03 LAB — DIFFERENTIAL
Basophils Absolute: 0 10*3/uL (ref 0.0–0.1)
Basophils Relative: 0 %
EOS ABS: 0.2 10*3/uL (ref 0.0–0.7)
EOS PCT: 2 %
Lymphocytes Relative: 29 %
Lymphs Abs: 4 10*3/uL (ref 0.7–4.0)
Monocytes Absolute: 0.8 10*3/uL (ref 0.1–1.0)
Monocytes Relative: 6 %
NEUTROS PCT: 63 %
Neutro Abs: 8.6 10*3/uL — ABNORMAL HIGH (ref 1.7–7.7)

## 2016-01-03 LAB — WET PREP, GENITAL
Clue Cells Wet Prep HPF POC: NONE SEEN
SPERM: NONE SEEN
Trich, Wet Prep: NONE SEEN
Yeast Wet Prep HPF POC: NONE SEEN

## 2016-01-03 LAB — GC/CHLAMYDIA PROBE AMP (~~LOC~~) NOT AT ARMC
CHLAMYDIA, DNA PROBE: NEGATIVE
NEISSERIA GONORRHEA: NEGATIVE

## 2016-01-03 LAB — POC URINE PREG, ED: PREG TEST UR: NEGATIVE

## 2016-01-03 MED ORDER — LOPERAMIDE HCL 2 MG PO CAPS: 2.0000 mg | ORAL_CAPSULE | Freq: Four times a day (QID) | ORAL | Status: DC | PRN

## 2016-01-03 MED ORDER — SODIUM CHLORIDE 0.9 % IV BOLUS (SEPSIS)
1000.0000 mL | Freq: Once | INTRAVENOUS | Status: AC
  Administered 2016-01-03: 1000 mL via INTRAVENOUS

## 2016-01-03 MED ORDER — ONDANSETRON HCL 4 MG/2ML IJ SOLN
4.0000 mg | Freq: Once | INTRAMUSCULAR | Status: AC
Start: 2016-01-03 — End: 2016-01-03
  Administered 2016-01-03: 4 mg via INTRAVENOUS
  Filled 2016-01-03: qty 2

## 2016-01-03 MED ORDER — IOPAMIDOL (ISOVUE-300) INJECTION 61%
INTRAVENOUS | Status: AC
  Administered 2016-01-03: 04:00:00
  Filled 2016-01-03: qty 100

## 2016-01-03 MED ORDER — DICYCLOMINE HCL 20 MG PO TABS: 20.0000 mg | ORAL_TABLET | Freq: Three times a day (TID) | ORAL | Status: DC

## 2016-01-03 MED ORDER — NYSTATIN 100000 UNIT/GM EX CREA: TOPICAL_CREAM | CUTANEOUS | Status: DC

## 2016-01-03 MED ORDER — ONDANSETRON 4 MG PO TBDP: 4.0000 mg | ORAL_TABLET | Freq: Three times a day (TID) | ORAL | Status: DC | PRN

## 2016-01-03 MED ORDER — IOPAMIDOL (ISOVUE-300) INJECTION 61%
100.0000 mL | Freq: Once | INTRAVENOUS | Status: AC | PRN
  Administered 2016-01-03: 100 mL via INTRAVENOUS

## 2016-01-03 NOTE — Discharge Instructions (Signed)
Abdominal Pain, Adult Many things can cause abdominal pain. Usually, abdominal pain is not caused by a disease and will improve without treatment. It can often be observed and treated at home. Your health care provider will do a physical exam and possibly order blood tests and X-rays to help determine the seriousness of your pain. However, in many cases, more time must pass before a clear cause of the pain can be found. Before that point, your health care provider may not know if you need more testing or further treatment. HOME CARE INSTRUCTIONS Monitor your abdominal pain for any changes. The following actions may help to alleviate any discomfort you are experiencing:  Only take over-the-counter or prescription medicines as directed by your health care provider.  Do not take laxatives unless directed to do so by your health care provider.  Try a clear liquid diet (broth, tea, or water) as directed by your health care provider. Slowly move to a bland diet as tolerated. SEEK MEDICAL CARE IF:  You have unexplained abdominal pain.  You have abdominal pain associated with nausea or diarrhea.  You have pain when you urinate or have a bowel movement.  You experience abdominal pain that wakes you in the night.  You have abdominal pain that is worsened or improved by eating food.  You have abdominal pain that is worsened with eating fatty foods.  You have a fever. SEEK IMMEDIATE MEDICAL CARE IF:  Your pain does not go away within 2 hours.  You keep throwing up (vomiting).  Your pain is felt only in portions of the abdomen, such as the right side or the left lower portion of the abdomen.  You pass bloody or black tarry stools. MAKE SURE YOU:  Understand these instructions.  Will watch your condition.  Will get help right away if you are not doing well or get worse.   This information is not intended to replace advice given to you by your health care provider. Make sure you discuss  any questions you have with your health care provider.   Document Released: 03/27/2005 Document Revised: 03/08/2015 Document Reviewed: 02/24/2013 Elsevier Interactive Patient Education 2016 [place].  Diarrhea Diarrhea is frequent loose and watery bowel movements. It can cause you to feel weak and dehydrated. Dehydration can cause you to become tired and thirsty, have a dry mouth, and have decreased urination that often is dark yellow. Diarrhea is a sign of another problem, most often an infection that will not last long. In most cases, diarrhea typically lasts 2-3 days. However, it can last longer if it is a sign of something more serious. It is important to treat your diarrhea as directed by your caregiver to lessen or prevent future episodes of diarrhea. CAUSES  Some common causes include:  Gastrointestinal infections caused by viruses, bacteria, or parasites.  Food poisoning or food allergies.  Certain medicines, such as antibiotics, chemotherapy, and laxatives.  Artificial sweeteners and fructose.  Digestive disorders. HOME CARE INSTRUCTIONS  Ensure adequate fluid intake (hydration): Have 1 cup (8 oz) of fluid for each diarrhea episode. Avoid fluids that contain simple sugars or sports drinks, fruit juices, whole milk products, and sodas. Your urine should be clear or pale yellow if you are drinking enough fluids. Hydrate with an oral rehydration solution that you can purchase at pharmacies, retail stores, and online. You can prepare an oral rehydration solution at home by mixing the following ingredients together:   - tsp table salt.   tsp baking soda.  tsp salt substitute containing potassium chloride.  1  tablespoons sugar.  1 L (34 oz) of water.  Certain foods and beverages may increase the speed at which food moves through the gastrointestinal (GI) tract. These foods and beverages should be avoided and include:  Caffeinated and alcoholic beverages.  High-fiber  foods, such as raw fruits and vegetables, nuts, seeds, and whole grain breads and cereals.  Foods and beverages sweetened with sugar alcohols, such as xylitol, sorbitol, and mannitol.  Some foods may be well tolerated and may help thicken stool including:  Starchy foods, such as rice, toast, pasta, low-sugar cereal, oatmeal, grits, baked potatoes, crackers, and bagels.  Bananas.  Applesauce.  Add probiotic-rich foods to help increase healthy bacteria in the GI tract, such as yogurt and fermented milk products.  Wash your hands well after each diarrhea episode.  Only take over-the-counter or prescription medicines as directed by your caregiver.  Take a warm bath to relieve any burning or pain from frequent diarrhea episodes. SEEK IMMEDIATE MEDICAL CARE IF:   You are unable to keep fluids down.  You have persistent vomiting.  You have blood in your stool, or your stools are black and tarry.  You do not urinate in 6-8 hours, or there is only a small amount of very dark urine.  You have abdominal pain that increases or localizes.  You have weakness, dizziness, confusion, or light-headedness.  You have a severe headache.  Your diarrhea gets worse or does not get better.  You have a fever or persistent symptoms for more than 2-3 days.  You have a fever and your symptoms suddenly get worse. MAKE SURE YOU:   Understand these instructions.  Will watch your condition.  Will get help right away if you are not doing well or get worse.   This information is not intended to replace advice given to you by your health care provider. Make sure you discuss any questions you have with your health care provider.   Document Released: 06/07/2002 Document Revised: 07/08/2014 Document Reviewed: 02/23/2012 Elsevier Interactive Patient Education 2016 [employer]  Food Choices to Help Relieve Diarrhea, Adult When you have diarrhea, the foods you eat and your eating habits are very  important. Choosing the right foods and drinks can help relieve diarrhea. Also, because diarrhea can last up to 7 days, you need to replace lost fluids and electrolytes (such as sodium, potassium, and chloride) in order to help prevent dehydration.  WHAT GENERAL GUIDELINES DO I NEED TO FOLLOW?  Slowly drink 1 cup (8 oz) of fluid for each episode of diarrhea. If you are getting enough fluid, your urine will be clear or pale yellow.  Eat starchy foods. Some good choices include white rice, white toast, pasta, low-fiber cereal, baked potatoes (without the skin), saltine crackers, and bagels.  Avoid large servings of any cooked vegetables.  Limit fruit to two servings per day. A serving is  cup or 1 small piece.  Choose foods with less than 2 g of fiber per serving.  Limit fats to less than 8 tsp (38 g) per day.  Avoid fried foods.  Eat foods that have probiotics in them. Probiotics can be found in certain dairy products.  Avoid foods and beverages that may increase the speed at which food moves through the stomach and intestines (gastrointestinal tract). Things to avoid include:  High-fiber foods, such as dried fruit, raw fruits and vegetables, nuts, seeds, and whole grain foods.  Spicy foods and high-fat foods.  Foods and beverages sweetened with high-fructose corn syrup, honey, or sugar alcohols such as xylitol, sorbitol, and mannitol. WHAT FOODS ARE RECOMMENDED? Grains White rice. White, [place], or pita breads (fresh or toasted), including plain rolls, buns, or bagels. White pasta. Saltine, soda, or graham crackers. Pretzels. Low-fiber cereal. Cooked cereals made with water (such as cornmeal, farina, or cream cereals). Plain muffins. Matzo. Melba toast. Zwieback.  Vegetables Potatoes (without the skin). Strained tomato and vegetable juices. Most well-cooked and canned vegetables without seeds. Tender lettuce. Fruits Cooked or canned applesauce, apricots, cherries, fruit cocktail,  grapefruit, peaches, pears, or plums. Fresh bananas, apples without skin, cherries, grapes, cantaloupe, grapefruit, peaches, oranges, or plums.  Meat and Other Protein Products Baked or boiled chicken. Eggs. Tofu. Fish. Seafood. Smooth peanut butter. Ground or well-cooked tender beef, ham, veal, lamb, pork, or poultry.  Dairy Plain yogurt, kefir, and unsweetened liquid yogurt. Lactose-free milk, buttermilk, or soy milk. Plain hard cheese. Beverages Sport drinks. Clear broths. Diluted fruit juices (except prune). Regular, caffeine-free sodas such as ginger ale. Water. Decaffeinated teas. Oral rehydration solutions. Sugar-free beverages not sweetened with sugar alcohols. Other Bouillon, broth, or soups made from recommended foods.  The items listed above may not be a complete list of recommended foods or beverages. Contact your dietitian for more options. WHAT FOODS ARE NOT RECOMMENDED? Grains Whole grain, whole wheat, bran, or rye breads, rolls, pastas, crackers, and cereals. Wild or brown rice. Cereals that contain more than 2 g of fiber per serving. Corn tortillas or taco shells. Cooked or dry oatmeal. Granola. Popcorn. Vegetables Raw vegetables. Cabbage, broccoli, Brussels sprouts, artichokes, baked beans, beet greens, corn, kale, legumes, peas, sweet potatoes, and yams. Potato skins. Cooked spinach and cabbage. Fruits Dried fruit, including raisins and dates. Raw fruits. Stewed or dried prunes. Fresh apples with skin, apricots, mangoes, pears, raspberries, and strawberries.  Meat and Other Protein Products Chunky peanut butter. Nuts and seeds. Beans and lentils. Berniece Salines.  Dairy High-fat cheeses. Milk, chocolate milk, and beverages made with milk, such as milk shakes. Cream. Ice cream. Sweets and Desserts Sweet rolls, doughnuts, and sweet breads. Pancakes and waffles. Fats and Oils Butter. Cream sauces. Margarine. Salad oils. Plain salad dressings. Olives. Avocados.  Beverages Caffeinated  beverages (such as coffee, tea, soda, or energy drinks). Alcoholic beverages. Fruit juices with pulp. Prune juice. Soft drinks sweetened with high-fructose corn syrup or sugar alcohols. Other Coconut. Hot sauce. Chili powder. Mayonnaise. Gravy. Cream-based or milk-based soups.  The items listed above may not be a complete list of foods and beverages to avoid. Contact your dietitian for more information. WHAT SHOULD I DO IF I BECOME DEHYDRATED? Diarrhea can sometimes lead to dehydration. Signs of dehydration include dark urine and dry mouth and skin. If you think you are dehydrated, you should rehydrate with an oral rehydration solution. These solutions can be purchased at pharmacies, retail stores, or online.  Drink -1 cup (120-240 mL) of oral rehydration solution each time you have an episode of diarrhea. If drinking this amount makes your diarrhea worse, try drinking smaller amounts more often. For example, drink 1-3 tsp (5-15 mL) every 5-10 minutes.  A general rule for staying hydrated is to drink 1-2 L of fluid per day. Talk to your health care provider about the specific amount you should be drinking each day. Drink enough fluids to keep your urine clear or pale yellow.   This information is not intended to replace advice given to you by your health care provider. Make sure you discuss  any questions you have with your health care provider.   Document Released: 09/07/2003 Document Revised: 07/08/2014 Document Reviewed: 05/10/2013 Elsevier Interactive Patient Education 2016 Elsevier Inc.  Cutaneous Candidiasis Cutaneous candidiasis is a condition in which there is an overgrowth of yeast (candida) on the skin. Yeast normally live on the skin, but in small enough numbers not to cause any symptoms. In certain cases, increased growth of the yeast may cause an actual yeast infection. This kind of infection usually occurs in areas of the skin that are constantly warm and moist, such as the armpits or  the groin. Yeast is the most common cause of diaper rash in babies and in people who cannot control their bowel movements (incontinence). CAUSES  The fungus that most often causes cutaneous candidiasis is Candida albicans. Conditions that can increase the risk of getting a yeast infection of the skin include:  Obesity.  Pregnancy.  Diabetes.  Taking antibiotic medicine.  Taking birth control pills.  Taking steroid medicines.  Thyroid disease.  An iron or zinc deficiency.  Problems with the immune system. SYMPTOMS   Red, swollen area of the skin.  Bumps on the skin.  Itchiness. DIAGNOSIS  The diagnosis of cutaneous candidiasis is usually based on its appearance. Light scrapings of the skin may also be taken and viewed under a microscope to identify the presence of yeast. TREATMENT  Antifungal creams may be applied to the infected skin. In severe cases, oral medicines may be needed.  HOME CARE INSTRUCTIONS   Keep your skin clean and dry.  Maintain a healthy weight.  If you have diabetes, keep your blood sugar under control. SEEK IMMEDIATE MEDICAL CARE IF:  Your rash continues to spread despite treatment.  You have a fever, chills, or abdominal pain.   This information is not intended to replace advice given to you by your health care provider. Make sure you discuss any questions you have with your health care provider.   Document Released: 03/05/2011 Document Revised: 09/09/2011 Document Reviewed: 12/19/2014 Elsevier Interactive Patient Education [employer] Elsevier Inc.   To find a primary care or specialty doctor please call [phone] or [phone] to access "[place] Find a Doctor Service."  You may also go on the [facility] website at [email or web address]  There are also multiple [facility] practices throughout the [place] that are frequently accepting new patients. You may find a clinic that is close to your home and  contact them.  [facility] -  [address] [place] [phone]  [facility] in [place] (also locations in [place] and [place]) -  [address] [place] [id number] [phone]  [facility] -  [address] [place] [address] [phone]

## 2016-01-03 NOTE — ED Provider Notes (Signed)
By signing my name below, I, Evon Slackerrance Branch, attest that this documentation has been prepared under the direction and in the presence of Enbridge EnergyKristen N Travian Kerner, DO. Electronically Signed: Evon Slackerrance Branch, ED Scribe. 01/03/2016. 2:24 AM.  TIME SEEN: 2:23 AM   CHIEF COMPLAINT: abdominal pain  HPI: HPI Comments: Kristen Padilla is a 25 y.o. female who presents to the Emergency Department complaining of worsening sharp lower left sided abdominal pain onset today. Pt reports that she has associated nausea and diarrhea. Pt reports that she is having vaginal bleeding due to her menstrual cycle. Pt report that her menstrual cycle has lasted for about 10 days now but states she has a Hx of irregular menstrual cycles.  She she states that her pain is worse when laying down. No other aggravating or alleviating factors. She has never had similar symptoms. Pt states that she has tried tylenol PTA with no relief. Denies fever, chills, vomiting, dysuria or hematuria, vaginal discharge.  [place] in [place].  No other recent travel. No sick contacts. She has had Chlamydia in the past when she was pregnant and it was treated. 6 active with one partner. She is a [id number].  Denies previous history of abdominal surgery.   ROS: See HPI Constitutional: no fever  Eyes: no drainage  ENT: no runny nose   Cardiovascular:  no chest pain  Resp: no SOB  GI: no vomiting GU: no dysuria Integumentary: no rash  Allergy: no hives  Musculoskeletal: no leg swelling  Neurological: no slurred speech ROS otherwise negative  PAST MEDICAL HISTORY/PAST SURGICAL HISTORY:  Past Medical History  Diagnosis Date  . Miscarriage   . Depression     MEDICATIONS:  Prior to Admission medications   Medication Sig Start Date End Date Taking? Authorizing Provider  acetaminophen (TYLENOL) 500 MG tablet Take 1,000 mg by mouth every 6 (six) hours as needed for moderate pain.    Historical Provider, MD  ibuprofen  (ADVIL,MOTRIN) 200 MG tablet Take 200 mg by mouth every 6 (six) hours as needed for pain.    Historical Provider, MD  omeprazole (PRILOSEC) 20 MG capsule Take 1 capsule (20 mg total) by mouth daily. 03/13/15   Everlene FarrierWilliam Dansie, PA-C  ondansetron (ZOFRAN ODT) 4 MG disintegrating tablet Take 1 tablet (4 mg total) by mouth every 8 (eight) hours as needed for nausea or vomiting. 03/13/15   Everlene FarrierWilliam Dansie, PA-C    ALLERGIES:  Allergies  Allergen Reactions  . Aripiprazole Rash  . Sulfa Antibiotics Rash    SOCIAL HISTORY:  Social History  Substance Use Topics  . Smoking status: Never Smoker   . Smokeless tobacco: Not on file  . Alcohol Use: No    FAMILY HISTORY: No family history on file.  EXAM: BP 114/87 mmHg  Pulse 85  Temp(Src) 98 F (36.7 C) (Oral)  Resp 16  Ht 5\' 2"  (1.575 m)  Wt 243 lb (110.224 kg)  BMI 44.43 kg/m2  SpO2 98%  LMP 12/24/2015 (Approximate) CONSTITUTIONAL: Alert and oriented and responds appropriately to questions. Well-appearing; well-nourished, Obese, afebrile, nontoxic HEAD: Normocephalic EYES: Conjunctivae clear, PERRL ENT: normal nose; no rhinorrhea; moist mucous membranes NECK: Supple, no meningismus, no LAD  CARD: RRR; S1 and S2 appreciated; no murmurs, no clicks, no rubs, no gallops RESP: Normal chest excursion without splinting or tachypnea; breath sounds clear and equal bilaterally; no wheezes, no rhonchi, no rales, no hypoxia or respiratory distress, speaking full sentences ABD/GI: Normal bowel sounds; non-distended; soft, TTP in LLQ, no  rebound, no guarding, no peritoneal signs GU:  Normal external genitalia. No lesions, rashes noted. Patient has Small amount of dark red vaginal bleeding on exam coming from the cervical os. No vaginal discharge.  No adnexal tenderness, mass or fullness, no cervical motion tenderness. Cervix is not appear friable.  Cervix is closed.  Chaperone present for exam.  Patient does have a beefy red rash noted to the inguinal  folds with satellite lesions consistent with yeast infection BACK:  The back appears normal and is non-tender to palpation, there is no CVA tenderness EXT: Normal ROM in all joints; non-tender to palpation; no edema; normal capillary refill; no cyanosis, no calf tenderness or swelling    SKIN: Normal color for age and race; warm; no rash NEURO: Moves all extremities equally, sensation to light touch intact diffusely, cranial nerves II through XII intact PSYCH: The patient's mood and manner are appropriate. Grooming and personal hygiene are appropriate.  MEDICAL DECISION MAKING: Patient here with left lower abdominal pain. Differential diagnosis includes colitis, diverticulitis, TOA, torsion. Harriett Sineancy test is negative. She declines pain medications at this time. We'll give IV fluids, Zofran. Labs pending. We'll perform pelvic exam with cultures and obtain a CT of her abdomen and pelvis.  ED PROGRESS: 2:40 AM  Pelvic exam is unremarkable. No adnexal tenderness or fullness. No cervical motion tenderness. No significant discharge. I doubt that her symptoms are pelvic in origin.  We'll proceed with CT scan.   5:00 AM  Pt reports that she is still feeling well. Labs unremarkable other than mild leukocytosis which does appear she has had before. Urine shows trace blood but she is on her menstrual cycle and no other sign of infection. Wet prep shows many white blood cells but she has had this many times in the past and has nothing to suggest cervicitis, TOA, PID on exam. Gonorrhea and chlamydia cultures are pending. CT of her abdomen and pelvis shows no acute intra-abdominal pathology. Discussed with her that this may be a viral illness. She has not had any vomiting or diarrhea in the emergency department. Discharged with friend, Imodium, Bentyl to take as needed. Discussed return precautions. We'll also give her nystatin for her yeast rash. She verbalized understanding and is comfortable with this plan.   At  this time, I do not feel there is any life-threatening condition present. I have reviewed and discussed all results (EKG, imaging, lab, urine as appropriate), exam findings with patient. I have reviewed nursing notes and appropriate previous records.  I feel the patient is safe to be discharged home without further emergent workup. Discussed usual and customary return precautions. Patient and family (if present) verbalize understanding and are comfortable with this plan.  Patient will follow-up with their primary care provider. If they do not have a primary care provider, information for follow-up has been provided to them. All questions have been answered.     I personally performed the services described in this documentation, which was scribed in my presence. The recorded information has been reviewed and is accurate.    Layla MawKristen N Fradel Baldonado, DO 01/03/16 0500

## 2016-01-03 NOTE — ED Notes (Signed)
Pt. reports left abdominal pain with nausea and diarrhea onset yesterday , denies fever or chills . No urinary discomfort or vaginal discharge .

## 2016-05-26 ENCOUNTER — Emergency Department (HOSPITAL_COMMUNITY)
Admission: EM | Admit: 2016-05-26 | Discharge: 2016-05-26 | Disposition: A | Discharge status: Home / Self Care | Payer: Medicaid Other | Attending: Emergency Medicine | Admitting: Emergency Medicine

## 2016-05-26 ENCOUNTER — Encounter (HOSPITAL_COMMUNITY): Payer: Self-pay | Admitting: Emergency Medicine

## 2016-05-26 DIAGNOSIS — Z79899 Other long term (current) drug therapy: Secondary | ICD-10-CM | POA: Insufficient documentation

## 2016-05-26 DIAGNOSIS — R21 Rash and other nonspecific skin eruption: Secondary | ICD-10-CM | POA: Insufficient documentation

## 2016-05-26 MED ORDER — NYSTATIN-TRIAMCINOLONE 100000-0.1 UNIT/GM-% EX CREA: TOPICAL_CREAM | CUTANEOUS | 0 refills | Status: DC

## 2016-05-26 NOTE — ED Triage Notes (Signed)
C/o rash to bilateral inner thighs since June.

## 2016-05-26 NOTE — ED Provider Notes (Signed)
[facility]-EMERG[facility]Y DEPT Provider Note   CSN: [id number] Arrival date & time: 05/26/16  2211   By signing my name below, I, Clovis PuAvnee Patel, attest that this documentation has been prepared under the direction and in the presence of  Kerrie BuffaloHope Neese, NP. Electronically Signed: Clovis PuAvnee Patel, ED Scribe. 05/26/16. 11:26 PM.   History   Chief Complaint Chief Complaint  Patient presents with  . Rash   The history is provided by the patient. No language interpreter was used.  Rash   This is a recurrent problem. The current episode started more than 1 week ago. The problem has not changed since onset.The problem is associated with an unknown factor. There has been no fever. The rash is present on the right upper leg and left upper leg. The pain is moderate. The pain has been constant since onset. Associated symptoms include blisters (intermittent), itching and weeping (intermittent ). Treatments tried: nystatin cream  The treatment provided no relief.   HPI Comments:  Kristen Padilla CurrentChristina LOUANN LEOS-TORRES is a 25 y.o. female who presents to the Emergency Department complaining of an constant, burning, itching rash to her bilateral thighs (right worse than left)  x several months (since 06/17). She notes associated intermittent blistering. Pt states the rash looks like it will resolve but then reappears. She has tried nystatin and OTC medications with no relief. Pt denies a hx of DM to her knowledge, fevers, chills, urinary symptoms any other associated symptoms and modifying factors at this time. Pt is not followed by a PCP or gynecologist.   Past Medical History:  Diagnosis Date  . Depression   . Miscarriage     There are no active problems to display for this patient.   Past Surgical History:  Procedure Laterality Date  . HERNIA REPAIR      OB History    No data available       Home Medications    Prior to Admission medications   Medication Sig Start Date End Date Taking? Authorizing Provider    dicyclomine (BENTYL) 20 MG tablet Take 1 tablet (20 mg total) by mouth 3 (three) times daily before meals. As needed for abdominal cramping 01/03/16   Layla MawKristen N Ward, DO  loperamide (IMODIUM) 2 MG capsule Take 1 capsule (2 mg total) by mouth 4 (four) times daily as needed for diarrhea or loose stools. 01/03/16   Kristen N Ward, DO  nystatin cream (MYCOSTATIN) Apply to affected area 2 times daily 01/03/16   Layla MawKristen N Ward, DO  nystatin-triamcinolone [facility](MYCOLOG II) cream Apply to affected area BID 05/26/16   Hope Orlene OchM Neese, NP  ondansetron (ZOFRAN ODT) 4 MG disintegrating tablet Take 1 tablet (4 mg total) by mouth every 8 (eight) hours as needed for nausea or vomiting. 01/03/16   Layla MawKristen N Ward, DO    Family History No family history on file.  Social History Social History  Substance Use Topics  . Smoking status: Never Smoker  . Smokeless tobacco: Never Used  . Alcohol use No     Allergies   Aripiprazole and Sulfa antibiotics   Review of Systems Review of Systems  Constitutional: Negative for chills and fever.  Genitourinary: Negative for difficulty urinating, dysuria, frequency and hematuria.  Skin: Positive for itching and rash.   Physical Exam Updated Vital Signs BP 112/61 (BP Location: Right Arm)   Pulse 76   Temp 97.4 F (36.3 C) (Oral)   Resp 18   LMP 05/18/2016   SpO2 100%   Physical Exam  Constitutional:  She is oriented to person, place, and time. She appears well-developed and well-nourished. No distress.  HENT:  Head: Normocephalic and atraumatic.  Eyes: Conjunctivae are normal.  Cardiovascular: Normal rate.   Pulmonary/Chest: Effort normal.  Abdominal: She exhibits no distension.  Neurological: She is alert and oriented to person, place, and time.  Skin: Skin is warm and dry. Rash noted.  Dry, pruritic rash to the inner aspect of the right thigh and a few areas to the inner aspect of the left thigh.   Psychiatric: She has a normal mood and affect.  Nursing note and  vitals reviewed.   ED Treatments / Results  DIAGNOSTIC STUDIES:  Oxygen Saturation is 100% on RA, normal by my interpretation.    COORDINATION OF CARE:  11:20 PM Discussed treatment plan with pt at bedside and pt agreed to plan.  Labs (all labs ordered are listed, but only abnormal results are displayed) Labs Reviewed - No data to display  Radiology No results found.  Procedures Procedures (including critical care time)  Medications Ordered in ED Medications - No data to display   Initial Impression / Assessment and Plan / ED Course  I have reviewed the triage vital signs and the nursing notes.  Clinical Course    Patient with nonspecific eruption. No signs of infection. Discharge with symptomatic treatment. Follow up with dermatology in 2-3 days if symptoms persist    Final Clinical Impressions(s) / ED Diagnoses   Final diagnoses:  Rash    New Prescriptions Discharge Medication List as of 05/26/2016 11:30 PM    START taking these medications   Details  nystatin-triamcinolone (MYCOLOG II) cream Apply to affected area BID, Print      *I personally performed the services described in this documentation, which was scribed in my presence. The recorded information has been reviewed and is accurate.     BuckhornHope M Neese, NP 05/27/16 0331    Laurence Spatesachel Morgan Little, MD 05/28/16 2035

## 2016-07-20 ENCOUNTER — Encounter (HOSPITAL_COMMUNITY): Payer: Self-pay | Admitting: Emergency Medicine

## 2016-07-20 ENCOUNTER — Emergency Department (HOSPITAL_COMMUNITY)
Admission: EM | Admit: 2016-07-20 | Discharge: 2016-07-21 | Disposition: A | Discharge status: Home / Self Care | Payer: Medicaid Other | Attending: Emergency Medicine | Admitting: Emergency Medicine

## 2016-07-20 DIAGNOSIS — J029 Acute pharyngitis, unspecified: Secondary | ICD-10-CM | POA: Insufficient documentation

## 2016-07-20 DIAGNOSIS — Z79899 Other long term (current) drug therapy: Secondary | ICD-10-CM | POA: Insufficient documentation

## 2016-07-20 DIAGNOSIS — R6889 Other general symptoms and signs: Secondary | ICD-10-CM

## 2016-07-20 LAB — CBC WITH DIFFERENTIAL/PLATELET
BASOS PCT: 0 %
Basophils Absolute: 0 10*3/uL (ref 0.0–0.1)
EOS ABS: 0 10*3/uL (ref 0.0–0.7)
EOS PCT: 0 %
HCT: 41.5 % (ref 36.0–46.0)
HEMOGLOBIN: 14.4 g/dL (ref 12.0–15.0)
LYMPHS ABS: 1.2 10*3/uL (ref 0.7–4.0)
Lymphocytes Relative: 12 %
MCH: 30.4 pg (ref 26.0–34.0)
MCHC: 34.7 g/dL (ref 30.0–36.0)
MCV: 87.7 fL (ref 78.0–100.0)
MONOS PCT: 7 %
Monocytes Absolute: 0.7 10*3/uL (ref 0.1–1.0)
Neutro Abs: 8.4 10*3/uL — ABNORMAL HIGH (ref 1.7–7.7)
Neutrophils Relative %: 81 %
PLATELETS: 300 10*3/uL (ref 150–400)
RBC: 4.73 MIL/uL (ref 3.87–5.11)
RDW: 12.9 % (ref 11.5–15.5)
WBC: 10.3 10*3/uL (ref 4.0–10.5)

## 2016-07-20 LAB — BASIC METABOLIC PANEL
Anion gap: 11 (ref 5–15)
BUN: 7 mg/dL (ref 6–20)
CALCIUM: 8.5 mg/dL — AB (ref 8.9–10.3)
CO2: 22 mmol/L (ref 22–32)
CREATININE: 0.81 mg/dL (ref 0.44–1.00)
Chloride: 105 mmol/L (ref 101–111)
Glucose, Bld: 133 mg/dL — ABNORMAL HIGH (ref 65–99)
Potassium: 3.4 mmol/L — ABNORMAL LOW (ref 3.5–5.1)
SODIUM: 138 mmol/L (ref 135–145)

## 2016-07-20 LAB — URINALYSIS, ROUTINE W REFLEX MICROSCOPIC
Bilirubin Urine: NEGATIVE
Glucose, UA: NEGATIVE mg/dL
KETONES UR: NEGATIVE mg/dL
Nitrite: NEGATIVE
Protein, ur: NEGATIVE mg/dL
SPECIFIC GRAVITY, URINE: 1.023 (ref 1.005–1.030)
pH: 5 (ref 5.0–8.0)

## 2016-07-20 LAB — I-STAT BETA HCG BLOOD, ED (MC, WL, AP ONLY): I-stat hCG, quantitative: <5 m[IU]/mL (ref <5)

## 2016-07-20 LAB — RAPID STREP SCREEN (MED CTR MEBANE ONLY): Streptococcus, Group A Screen (Direct): NEGATIVE

## 2016-07-20 MED ORDER — ACETAMINOPHEN 325 MG PO TABS
650.0000 mg | ORAL_TABLET | Freq: Once | ORAL | Status: AC
  Administered 2016-07-20: 650 mg via ORAL

## 2016-07-20 MED ORDER — ACETAMINOPHEN 325 MG PO TABS
ORAL_TABLET | ORAL | Status: DC
Start: 2016-07-20 — End: 2016-07-21
  Filled 2016-07-20: qty 2

## 2016-07-20 NOTE — ED Triage Notes (Signed)
Patient reports fever , dry cough , sore throat , nasal congestion and headache onset this week unrelieved by OTC medications .

## 2016-07-21 MED ORDER — NAPROXEN 250 MG PO TABS
500.0000 mg | ORAL_TABLET | Freq: Once | ORAL | Status: AC
  Administered 2016-07-21: 500 mg via ORAL
  Filled 2016-07-21: qty 2

## 2016-07-21 MED ORDER — HYDROCODONE-ACETAMINOPHEN 7.5-325 MG/15ML PO SOLN: 15.0000 mL | Freq: Four times a day (QID) | ORAL | 0 refills | Status: DC | PRN

## 2016-07-21 MED ORDER — FLUTICASONE PROPIONATE 50 MCG/ACT NA SUSP: 2.0000 | Freq: Every day | NASAL | 0 refills | Status: DC

## 2016-07-21 MED ORDER — IBUPROFEN 600 MG PO TABS: 600.0000 mg | ORAL_TABLET | Freq: Four times a day (QID) | ORAL | 0 refills | Status: DC | PRN

## 2016-07-21 MED ORDER — BENZONATATE 100 MG PO CAPS
200.0000 mg | ORAL_CAPSULE | Freq: Once | ORAL | Status: AC
  Administered 2016-07-21: 200 mg via ORAL
  Filled 2016-07-21: qty 2

## 2016-07-21 MED ORDER — BENZONATATE 100 MG PO CAPS: 100.0000 mg | ORAL_CAPSULE | Freq: Three times a day (TID) | ORAL | 0 refills | Status: DC | PRN

## 2016-07-21 MED ORDER — HYDROCODONE-ACETAMINOPHEN 7.5-325 MG/15ML PO SOLN
10.0000 mL | Freq: Once | ORAL | Status: AC
  Administered 2016-07-21: 10 mL via ORAL
  Filled 2016-07-21: qty 15

## 2016-07-21 NOTE — ED Provider Notes (Signed)
[facility] Provider Note   CSN: [id number] Arrival date & time: 07/20/16  2201     History   Chief Complaint Chief Complaint  Patient presents with  . Fever  . Sore Throat  . Cough  . Nasal Congestion    HPI Kristen Padilla is a 26 y.o. female.  26 year old female with a history of depression presents to the emergency department for evaluation of flulike symptoms. Patient states that symptoms began yesterday. She is complaining primarily of a sore throat as well as a dry cough. She has experienced nasal congestion as well as headache, body aches, and fever. Patient febrile to 102.11F on arrival. She has been taking DayQuil and NyQuil for symptoms without relief. Patient denies any sick contacts. No inability to swallow or drooling. No vomiting or diarrhea.   The history is provided by the patient. No language interpreter was used.  Fever   Associated symptoms include cough.  Sore Throat   Cough     Past Medical History:  Diagnosis Date  . Depression   . Miscarriage     There are no active problems to display for this patient.   Past Surgical History:  Procedure Laterality Date  . HERNIA REPAIR      OB History    No data available       Home Medications    Prior to Admission medications   Medication Sig Start Date End Date Taking? Authorizing Provider  benzonatate (TESSALON) 100 MG capsule Take 1-2 capsules (100-200 mg total) by mouth 3 (three) times daily as needed for cough. 07/21/16   Antony Madura, PA-C  dicyclomine (BENTYL) 20 MG tablet Take 1 tablet (20 mg total) by mouth 3 (three) times daily before meals. As needed for abdominal cramping 01/03/16   Kristen N Ward, DO  fluticasone (FLONASE) 50 MCG/ACT nasal spray Place 2 sprays into both nostrils daily. For nasal congestion 07/21/16   Antony Madura, PA-C  HYDROcodone-acetaminophen (HYCET) 7.5-325 mg/15 ml solution Take 15 mLs by mouth every 6 (six) hours as needed. For sore throat  07/21/16   Antony Madura, PA-C  ibuprofen (ADVIL,MOTRIN) 600 MG tablet Take 1 tablet (600 mg total) by mouth every 6 (six) hours as needed for headache, mild pain or moderate pain. 07/21/16   Antony Madura, PA-C  loperamide (IMODIUM) 2 MG capsule Take 1 capsule (2 mg total) by mouth 4 (four) times daily as needed for diarrhea or loose stools. 01/03/16   Kristen N Ward, DO  nystatin cream (MYCOSTATIN) Apply to affected area 2 times daily 01/03/16   Layla Maw Ward, DO  nystatin-triamcinolone [facility] II) cream Apply to affected area BID 05/26/16   Hope Orlene Och, NP  ondansetron (ZOFRAN ODT) 4 MG disintegrating tablet Take 1 tablet (4 mg total) by mouth every 8 (eight) hours as needed for nausea or vomiting. 01/03/16   Layla Maw Ward, DO    Family History No family history on file.  Social History Social History  Substance Use Topics  . Smoking status: Never Smoker  . Smokeless tobacco: Never Used  . Alcohol use No     Allergies   Aripiprazole and Sulfa antibiotics   Review of Systems Review of Systems  Constitutional: Positive for fever.  Respiratory: Positive for cough.   Ten systems reviewed and are negative for acute change, except as noted in the HPI.    Physical Exam Updated Vital Signs BP 120/71   Pulse 120   Temp 98.2 F (36.8 C) (Oral)  Resp 16   Ht 5\' 2"  (1.575 m)   Wt 112.9 kg   LMP 07/06/2016 (Approximate)   SpO2 99%   BMI 45.54 kg/m   Physical Exam  Constitutional: She is oriented to person, place, and time. She appears well-developed and well-nourished. No distress.  Nontoxic appearing and in no acute distress  HENT:  Head: Normocephalic and atraumatic.  Bilateral TMs clear. Mild posterior oropharyngeal erythema. No edema or exudates. Uvula midline. Patient tolerating secretions without difficulty.  Eyes: Conjunctivae and EOM are normal. No scleral icterus.  Neck: Normal range of motion.  No nuchal rigidity or meningismus  Cardiovascular: Regular rhythm and intact  distal pulses.   Mild tachycardia  Pulmonary/Chest: Effort normal. No respiratory distress. She has no wheezes. She has no rales.  Lungs CTAB. Respirations even and unlabored.  Musculoskeletal: Normal range of motion.  Neurological: She is alert and oriented to person, place, and time. She exhibits normal muscle tone. Coordination normal.  GCS 15. Patient moving all extremities. She ambulates with steady gait.  Skin: Skin is warm and dry. No rash noted. She is not diaphoretic. No erythema. No pallor.  Psychiatric: She has a normal mood and affect. Her behavior is normal.  Nursing note and vitals reviewed.    ED Treatments / Results  Labs (all labs ordered are listed, but only abnormal results are displayed) Labs Reviewed  CBC WITH DIFFERENTIAL/PLATELET - Abnormal; Notable for the following:       Result Value   Neutro Abs 8.4 (*)    All other components within normal limits  BASIC METABOLIC PANEL - Abnormal; Notable for the following:    Potassium 3.4 (*)    Glucose, Bld 133 (*)    Calcium 8.5 (*)    All other components within normal limits  URINALYSIS, ROUTINE W REFLEX MICROSCOPIC - Abnormal; Notable for the following:    APPearance HAZY (*)    Hgb urine dipstick SMALL (*)    Leukocytes, UA LARGE (*)    Bacteria, UA RARE (*)    Squamous Epithelial / LPF 6-30 (*)    All other components within normal limits  RAPID STREP SCREEN (NOT [facility])  CULTURE, GROUP A STREP ([facility])  I-STAT BETA HCG BLOOD, ED ([facility], [facility], [facility] ONLY)    EKG  EKG Interpretation None       Radiology No results found.  Procedures Procedures (including critical care time)  Medications Ordered in ED Medications  acetaminophen (TYLENOL) tablet 650 mg (650 mg Oral Given 07/20/16 2225)  HYDROcodone-acetaminophen (HYCET) 7.5-325 mg/15 ml solution 10 mL (10 mLs Oral Given 07/21/16 0051)  benzonatate (TESSALON) capsule 200 mg (200 mg Oral Given 07/21/16 0052)  naproxen (NAPROSYN) tablet 500 mg (500 mg Oral  Given 07/21/16 0052)     Initial Impression / Assessment and Plan / ED Course  I have reviewed the triage vital signs and the nursing notes.  Pertinent labs & imaging results that were available during my care of the patient were reviewed by me and considered in my medical decision making (see chart for details).     Patient with symptoms consistent with influenza. Patient nontoxic appearing. Fever responding to antipyretics. No leukocytosis. No hx of vomiting. Patient tolerating POs. Negative strep screen. Lungs are clear. No hypoxia. Discussed the cost versus benefit of Tamiflu treatment with the patient. Patient opts for supportive care at this time. Will d/c with Rx for Hycet, Flonase, Tessalon, and ibuprofen. Primary care follow-up advised and return precautions given. Patient discharged in  stable condition with no unaddressed concerns.   Final Clinical Impressions(s) / ED Diagnoses   Final diagnoses:  Flu-like symptoms    New Prescriptions New Prescriptions   BENZONATATE (TESSALON) 100 MG CAPSULE    Take 1-2 capsules (100-200 mg total) by mouth 3 (three) times daily as needed for cough.   FLUTICASONE (FLONASE) 50 MCG/ACT NASAL SPRAY    Place 2 sprays into both nostrils daily. For nasal congestion   HYDROCODONE-ACETAMINOPHEN (HYCET) 7.5-325 MG/15 ML SOLUTION    Take 15 mLs by mouth every 6 (six) hours as needed. For sore throat   IBUPROFEN (ADVIL,MOTRIN) 600 MG TABLET    Take 1 tablet (600 mg total) by mouth every 6 (six) hours as needed for headache, mild pain or moderate pain.     Antony Madura, PA-C 07/21/16 0105    Tomasita Crumble, MD 07/21/16 [phone]

## 2016-07-23 LAB — CULTURE, GROUP A STREP (THRC)

## 2016-11-03 ENCOUNTER — Emergency Department (HOSPITAL_COMMUNITY)
Admission: EM | Admit: 2016-11-03 | Discharge: 2016-11-03 | Disposition: A | Discharge status: Home / Self Care | Payer: Medicaid Other | Attending: Emergency Medicine | Admitting: Emergency Medicine

## 2016-11-03 ENCOUNTER — Encounter (HOSPITAL_COMMUNITY): Payer: Self-pay

## 2016-11-03 DIAGNOSIS — Z79899 Other long term (current) drug therapy: Secondary | ICD-10-CM | POA: Insufficient documentation

## 2016-11-03 DIAGNOSIS — N939 Abnormal uterine and vaginal bleeding, unspecified: Secondary | ICD-10-CM | POA: Insufficient documentation

## 2016-11-03 LAB — URINALYSIS, ROUTINE W REFLEX MICROSCOPIC
BACTERIA UA: NONE SEEN
Bilirubin Urine: NEGATIVE
Glucose, UA: NEGATIVE mg/dL
Ketones, ur: NEGATIVE mg/dL
LEUKOCYTES UA: NEGATIVE
Nitrite: NEGATIVE
Protein, ur: NEGATIVE mg/dL
Specific Gravity, Urine: 1.026 (ref 1.005–1.030)
pH: 5 (ref 5.0–8.0)

## 2016-11-03 LAB — CBC WITH DIFFERENTIAL/PLATELET
BASOS ABS: 0 10*3/uL (ref 0.0–0.1)
Basophils Relative: 0 %
EOS PCT: 2 %
Eosinophils Absolute: 0.3 10*3/uL (ref 0.0–0.7)
HEMATOCRIT: 40.6 % (ref 36.0–46.0)
Hemoglobin: 13.8 g/dL (ref 12.0–15.0)
LYMPHS PCT: 29 %
Lymphs Abs: 3.8 10*3/uL (ref 0.7–4.0)
MCH: 29.9 pg (ref 26.0–34.0)
MCHC: 34 g/dL (ref 30.0–36.0)
MCV: 88.1 fL (ref 78.0–100.0)
MONO ABS: 0.6 10*3/uL (ref 0.1–1.0)
MONOS PCT: 5 %
NEUTROS ABS: 8.5 10*3/uL — AB (ref 1.7–7.7)
Neutrophils Relative %: 64 %
PLATELETS: 337 10*3/uL (ref 150–400)
RBC: 4.61 MIL/uL (ref 3.87–5.11)
RDW: 12.9 % (ref 11.5–15.5)
WBC: 13.2 10*3/uL — ABNORMAL HIGH (ref 4.0–10.5)

## 2016-11-03 LAB — BASIC METABOLIC PANEL
Anion gap: 8 (ref 5–15)
BUN: 9 mg/dL (ref 6–20)
CALCIUM: 8.9 mg/dL (ref 8.9–10.3)
CO2: 23 mmol/L (ref 22–32)
CREATININE: 0.83 mg/dL (ref 0.44–1.00)
Chloride: 105 mmol/L (ref 101–111)
GFR calc Af Amer: >60 mL/min (ref >60)
GLUCOSE: 113 mg/dL — AB (ref 65–99)
Potassium: 3.6 mmol/L (ref 3.5–5.1)
Sodium: 136 mmol/L (ref 135–145)

## 2016-11-03 LAB — PREGNANCY, URINE: PREG TEST UR: NEGATIVE

## 2016-11-03 NOTE — Discharge Instructions (Signed)
If you were given medicines take as directed.  If you are on coumadin or contraceptives realize their levels and effectiveness is altered by many different medicines.  If you have any reaction (rash, tongues swelling, other) to the medicines stop taking and see a physician.    If your blood pressure was elevated in the ER make sure you follow up for management with a primary doctor or return for chest pain, shortness of breath or stroke symptoms.  Please follow up as directed and return to the ER or see a physician for new or worsening symptoms.  Thank you. Vitals:   11/03/16 2034 11/03/16 2035  BP: 134/85   Pulse: 95   Resp: 16   Temp: 98 F (36.7 C)   TempSrc: Oral   SpO2: 98%   Weight:  240 lb (108.9 kg)  Height:  5\' 2"  (1.575 m)

## 2016-11-03 NOTE — ED Triage Notes (Signed)
Pt complaining of vaginal bleeding x 1 month. Pt complaining of new onset lower abdominal pain. Pt states 1 pad/hr. Pt states dime-sized blood clots.

## 2016-11-03 NOTE — ED Notes (Signed)
OB/GNY : Patient reports persistent vaginal bleeding for > 1 month with intermittent low abdominal cramping , pt. stated occasional blood clots , denies injury , no fever or chills .

## 2016-11-03 NOTE — ED Provider Notes (Signed)
[facility] Provider Note   CSN: [id number] Arrival date & time: 11/03/16  2030     History   Chief Complaint Chief Complaint  Patient presents with  . Vaginal Bleeding    HPI Kristen Padilla is a 26 y.o. female.  Patient presents with intermittent vaginal bleeding for the past month. Heavier than her normal periods. No syncope or lightheadedness. No blood thinners. No abnormal vaginal discharge or new sexual partners. No abdominal pain. Patient has no current doctor. Patient is not currently pregnant.      Past Medical History:  Diagnosis Date  . Depression   . Miscarriage     There are no active problems to display for this patient.   Past Surgical History:  Procedure Laterality Date  . HERNIA REPAIR      OB History    No data available       Home Medications    Prior to Admission medications   Medication Sig Start Date End Date Taking? Authorizing Provider  benzonatate (TESSALON) 100 MG capsule Take 1-2 capsules (100-200 mg total) by mouth 3 (three) times daily as needed for cough. 07/21/16   Antony MaduraHumes, Kelly, PA-C  dicyclomine (BENTYL) 20 MG tablet Take 1 tablet (20 mg total) by mouth 3 (three) times daily before meals. As needed for abdominal cramping 01/03/16   Ward, Baxter HireKristen N, DO  fluticasone (FLONASE) 50 MCG/ACT nasal spray Place 2 sprays into both nostrils daily. For nasal congestion 07/21/16   Antony MaduraHumes, Kelly, PA-C  HYDROcodone-acetaminophen (HYCET) 7.5-325 mg/15 ml solution Take 15 mLs by mouth every 6 (six) hours as needed. For sore throat 07/21/16   Antony MaduraHumes, Kelly, PA-C  ibuprofen (ADVIL,MOTRIN) 600 MG tablet Take 1 tablet (600 mg total) by mouth every 6 (six) hours as needed for headache, mild pain or moderate pain. 07/21/16   Antony MaduraHumes, Kelly, PA-C  loperamide (IMODIUM) 2 MG capsule Take 1 capsule (2 mg total) by mouth 4 (four) times daily as needed for diarrhea or loose stools. 01/03/16   Ward, Layla MawKristen N, DO  nystatin cream (MYCOSTATIN) Apply to  affected area 2 times daily 01/03/16   Ward, Layla MawKristen N, DO  nystatin-triamcinolone (MYCOLOG II) cream Apply to affected area BID 05/26/16   Janne NapoleonNeese, Hope M, NP  ondansetron (ZOFRAN ODT) 4 MG disintegrating tablet Take 1 tablet (4 mg total) by mouth every 8 (eight) hours as needed for nausea or vomiting. 01/03/16   Ward, Layla MawKristen N, DO    Family History History reviewed. No pertinent family history.  Social History Social History  Substance Use Topics  . Smoking status: Never Smoker  . Smokeless tobacco: Never Used  . Alcohol use No     Allergies   Aripiprazole and Sulfa antibiotics   Review of Systems Review of Systems  Constitutional: Negative for chills and fever.  Respiratory: Negative for shortness of breath.   Gastrointestinal: Negative for abdominal pain and vomiting.  Genitourinary: Positive for vaginal bleeding. Negative for dysuria and flank pain.  Musculoskeletal: Negative for back pain, neck pain and neck stiffness.  Skin: Negative for rash.  Neurological: Negative for light-headedness and headaches.     Physical Exam Updated Vital Signs BP 122/74 (BP Location: Right Arm)   Pulse 82   Temp 98 F (36.7 C) (Oral)   Resp 16   Ht 5\' 2"  (1.575 m)   Wt 240 lb (108.9 kg)   LMP 11/03/2016 (Exact Date)   SpO2 99%   BMI 43.90 kg/m   Physical Exam  Constitutional: She is  oriented to person, place, and time. She appears well-developed and well-nourished.  HENT:  Head: Normocephalic and atraumatic.  Eyes: Conjunctivae are normal. Right eye exhibits no discharge. Left eye exhibits no discharge.  Neck: Normal range of motion. Neck supple. No tracheal deviation present.  Cardiovascular: Normal rate and regular rhythm.   Pulmonary/Chest: Effort normal and breath sounds normal.  Abdominal: Soft. She exhibits no distension. There is no tenderness. There is no guarding.  Musculoskeletal: She exhibits no edema.  Neurological: She is alert and oriented to person, place, and  time.  Skin: Skin is warm. No rash noted.  Psychiatric: She has a normal mood and affect.  Nursing note and vitals reviewed.    ED Treatments / Results  Labs (all labs ordered are listed, but only abnormal results are displayed) Labs Reviewed  CBC WITH DIFFERENTIAL/PLATELET - Abnormal; Notable for the following:       Result Value   WBC 13.2 (*)    Neutro Abs 8.5 (*)    All other components within normal limits  BASIC METABOLIC PANEL - Abnormal; Notable for the following:    Glucose, Bld 113 (*)    All other components within normal limits  URINALYSIS, ROUTINE W REFLEX MICROSCOPIC - Abnormal; Notable for the following:    APPearance HAZY (*)    Hgb urine dipstick LARGE (*)    Squamous Epithelial / LPF 0-5 (*)    All other components within normal limits  PREGNANCY, URINE    EKG  EKG Interpretation None       Radiology No results found.  Procedures Procedures (including critical care time)  Medications Ordered in ED Medications - No data to display   Initial Impression / Assessment and Plan / ED Course  I have reviewed the triage vital signs and the nursing notes.  Pertinent labs & imaging results that were available during my care of the patient were reviewed by me and considered in my medical decision making (see chart for details).     Patient presents with vaginal bleeding. Hemoglobin normal, no abdominal pain, no concern for STDs. Patient comfortable following up with gynecology for formal pelvic exam and treatment options. Results and differential diagnosis were discussed with the patient/parent/guardian. Xrays were independently reviewed by myself.  Close follow up outpatient was discussed, comfortable with the plan.   Medications - No data to display  Vitals:   11/03/16 2034 11/03/16 2035 11/03/16 2152  BP: 134/85  122/74  Pulse: 95  82  Resp: 16  16  Temp: 98 F (36.7 C)    TempSrc: Oral    SpO2: 98%  99%  Weight:  240 lb (108.9 kg)     Height:  5\' 2"  (1.575 m)     Final diagnoses:  Vaginal bleeding     Final Clinical Impressions(s) / ED Diagnoses   Final diagnoses:  Vaginal bleeding    New Prescriptions New Prescriptions   No medications on file     Blane Ohara, MD 11/03/16 2204

## 2016-11-03 NOTE — ED Notes (Signed)
Pelvic cart set up at bedside  

## 2016-11-20 ENCOUNTER — Ambulatory Visit: Payer: Medicaid Other | Admitting: Obstetrics

## 2017-05-12 ENCOUNTER — Inpatient Hospital Stay (HOSPITAL_COMMUNITY)
Admission: AD | Admit: 2017-05-12 | Discharge: 2017-05-12 | Disposition: A | Discharge status: Home / Self Care | Payer: Self-pay | Source: Ambulatory Visit | Attending: Family Medicine | Admitting: Family Medicine

## 2017-05-12 ENCOUNTER — Encounter (HOSPITAL_COMMUNITY): Payer: Self-pay

## 2017-05-12 DIAGNOSIS — N939 Abnormal uterine and vaginal bleeding, unspecified: Secondary | ICD-10-CM | POA: Insufficient documentation

## 2017-05-12 DIAGNOSIS — N926 Irregular menstruation, unspecified: Secondary | ICD-10-CM

## 2017-05-12 DIAGNOSIS — Z3202 Encounter for pregnancy test, result negative: Secondary | ICD-10-CM | POA: Insufficient documentation

## 2017-05-12 LAB — URINALYSIS, ROUTINE W REFLEX MICROSCOPIC
Bilirubin Urine: NEGATIVE
GLUCOSE, UA: NEGATIVE mg/dL
Ketones, ur: NEGATIVE mg/dL
Leukocytes, UA: NEGATIVE
NITRITE: NEGATIVE
PROTEIN: NEGATIVE mg/dL
SPECIFIC GRAVITY, URINE: 1.016 (ref 1.005–1.030)
pH: 5 (ref 5.0–8.0)

## 2017-05-12 LAB — POCT PREGNANCY, URINE: Preg Test, Ur: NEGATIVE

## 2017-05-12 NOTE — MAU Note (Signed)
Pt states she had 4 +upt at home. Pt c/o lower abdominal pain and on left side and vaginal bleeding like a period that started today. LMP: unsure but sometime in middle of September.

## 2017-05-12 NOTE — MAU Provider Note (Signed)
History     CSN: [id number]  Arrival date and time: 05/12/17 1927   None     Chief Complaint  Patient presents with  . Possible Pregnancy  . Pelvic Pain   HPI 26 yo G1P1 here for evaluation of vaginal bleeding. Patient reports LMP in September. She reports several positive home pregnancy tests. She describes onset of vaginal bleeding today and some mild cramping pain. She has a history of irregular periods for the past 2 years. She reports that when she does have a period, it typically last 2 weeks. She denies any other complaints. She has not been evaluated for oligomenorrhea  OB History    No data available      Past Medical History:  Diagnosis Date  . Depression   . Miscarriage     Past Surgical History:  Procedure Laterality Date  . HERNIA REPAIR      No family history on file.  Social History   Tobacco Use  . Smoking status: Never Smoker  . Smokeless tobacco: Never Used  Substance Use Topics  . Alcohol use: No  . Drug use: No    Allergies:  Allergies  Allergen Reactions  . Aripiprazole Rash  . Sulfa Antibiotics Rash    Medications Prior to Admission  Medication Sig Dispense Refill Last Dose  . benzonatate (TESSALON) 100 MG capsule Take 1-2 capsules (100-200 mg total) by mouth 3 (three) times daily as needed for cough. 21 capsule 0   . dicyclomine (BENTYL) 20 MG tablet Take 1 tablet (20 mg total) by mouth 3 (three) times daily before meals. As needed for abdominal cramping 20 tablet 0   . fluticasone (FLONASE) 50 MCG/ACT nasal spray Place 2 sprays into both nostrils daily. For nasal congestion 16 g 0   . HYDROcodone-acetaminophen (HYCET) 7.5-325 mg/15 ml solution Take 15 mLs by mouth every 6 (six) hours as needed. For sore throat 120 mL 0   . ibuprofen (ADVIL,MOTRIN) 600 MG tablet Take 1 tablet (600 mg total) by mouth every 6 (six) hours as needed for headache, mild pain or moderate pain. 30 tablet 0   . loperamide (IMODIUM) 2 MG capsule Take 1 capsule  (2 mg total) by mouth 4 (four) times daily as needed for diarrhea or loose stools. 12 capsule 0   . nystatin cream (MYCOSTATIN) Apply to affected area 2 times daily 30 g 0   . nystatin-triamcinolone (MYCOLOG II) cream Apply to affected area BID 30 g 0   . ondansetron (ZOFRAN ODT) 4 MG disintegrating tablet Take 1 tablet (4 mg total) by mouth every 8 (eight) hours as needed for nausea or vomiting. 10 tablet 0     Review of Systems  See pertinent in HPI Physical Exam   Blood pressure 136/66, pulse 79, temperature 97.6 F (36.4 C), temperature source Oral, resp. rate 19, height 5\' 2"  (1.575 m), weight 253 lb (114.8 kg), SpO2 100 %.  Physical Exam GENERAL: Well-developed, well-nourished female in no acute distress.  HEENT: Normocephalic, atraumatic. Sclerae anicteric.  NECK: Supple. Normal thyroid.  LUNGS: Clear to auscultation bilaterally.  HEART: Regular rate and rhythm. ABDOMEN: Soft, nontender, nondistended.  PELVIC: Normal external female genitalia. Vagina is pink and rugated.  Normal discharge. Normal appearing cervix. Uterus is normal in size.  No adnexal mass or tenderness. EXTREMITIES: No cyanosis, clubbing, or edema, 2+ distal pulses.  MAU Course  Procedures  MDM Results for orders placed or performed during the hospital encounter of 05/12/17 (from the past 24 hour(s))  Pregnancy, urine POC     Status: None   Collection Time: 05/12/17  7:56 PM  Result Value Ref Range   Preg Test, Ur NEGATIVE NEGATIVE    Assessment and Plan  26 yo G1P1 with onset of menses - Lab results reviewed with patient - Discussed the need for medical evaluation of oligomenorrhea - Advised to start taking prenatal vitamins as her plan is to conceive - Precautions reviewed - RTC prn  Antoine Vandermeulen 05/12/2017, 8:01 PM

## 2018-01-14 ENCOUNTER — Encounter (HOSPITAL_COMMUNITY): Payer: Self-pay | Admitting: Emergency Medicine

## 2018-01-14 ENCOUNTER — Ambulatory Visit (HOSPITAL_COMMUNITY)
Admission: EM | Admit: 2018-01-14 | Discharge: 2018-01-14 | Disposition: A | Discharge status: Home / Self Care | Payer: Self-pay | Attending: Internal Medicine | Admitting: Internal Medicine

## 2018-01-14 DIAGNOSIS — H60501 Unspecified acute noninfective otitis externa, right ear: Secondary | ICD-10-CM

## 2018-01-14 DIAGNOSIS — H9201 Otalgia, right ear: Secondary | ICD-10-CM

## 2018-01-14 DIAGNOSIS — H66001 Acute suppurative otitis media without spontaneous rupture of ear drum, right ear: Secondary | ICD-10-CM

## 2018-01-14 MED ORDER — CIPROFLOXACIN-HYDROCORTISONE 0.2-1 % OT SUSP: 3.0000 [drp] | Freq: Two times a day (BID) | OTIC | 0 refills | Status: AC

## 2018-01-14 MED ORDER — AMOXICILLIN-POT CLAVULANATE 875-125 MG PO TABS: 1.0000 | ORAL_TABLET | Freq: Two times a day (BID) | ORAL | 0 refills | Status: AC

## 2018-01-14 MED ORDER — NAPROXEN 375 MG PO TABS: 375.0000 mg | ORAL_TABLET | Freq: Two times a day (BID) | ORAL | 0 refills | Status: DC

## 2018-01-14 NOTE — Discharge Instructions (Addendum)
Rest and drink plenty of fluids Prescribed augmentin 875 for 7-10 days Prescribed ciprofloxacin ear drops Prescribed naproxen as needed for pain Take medications as directed and to completion PCP assistance initiated Follow up with PCP if symptoms persists Return here or go to the ER if you have any new or worsening symptoms

## 2018-01-14 NOTE — ED Triage Notes (Signed)
Pt c/o R ear pain x1 week.  

## 2018-01-14 NOTE — ED Provider Notes (Signed)
[facility]   [id number] 01/14/18 Arrival Time: 1839  SUBJECTIVE: History from: patient.  Kristen Padilla is a 27 y.o. female who presents with of right ear pain for the past week.  Denies a precipitating event, such as swimming or wearing ear plugs.  Patient states the pain is constant and sharp in character.  Patient has tried OTC ear drops without relief.  Symptoms are made worse with lying down.  Denies similar symptoms in the past.    Denies fever, chills, fatigue, sinus pain, rhinorrhea, ear discharge, sore throat, SOB, wheezing, chest pain, nausea, changes in bowel or bladder habits.    ROS: As per HPI.  Past Medical History:  Diagnosis Date  . Depression   . Miscarriage    Past Surgical History:  Procedure Laterality Date  . HERNIA REPAIR     Allergies  Allergen Reactions  . Aripiprazole Rash  . Sulfa Antibiotics Rash   No current facility-administered medications on file prior to encounter.    Current Outpatient Medications on File Prior to Encounter  Medication Sig Dispense Refill  . loperamide (IMODIUM) 2 MG capsule Take 1 capsule (2 mg total) by mouth 4 (four) times daily as needed for diarrhea or loose stools. (Patient not taking: Reported on 01/14/2018) 12 capsule 0  . nystatin cream (MYCOSTATIN) Apply to affected area 2 times daily (Patient not taking: Reported on 01/14/2018) 30 g 0  . nystatin-triamcinolone (MYCOLOG II) cream Apply to affected area BID (Patient not taking: Reported on 01/14/2018) 30 g 0  . [DISCONTINUED] omeprazole (PRILOSEC) 20 MG capsule Take 1 capsule (20 mg total) by mouth daily. (Patient not taking: Reported on 01/03/2016) 30 capsule 0   Social History   Socioeconomic History  . Marital status: Married    Spouse name: Not on file  . Number of children: Not on file  . Years of education: Not on file  . Highest education level: Not on file  Occupational History  . Not on file  Social Needs  . Financial resource  strain: Not on file  . Food insecurity:    Worry: Not on file    Inability: Not on file  . Transportation needs:    Medical: Not on file    Non-medical: Not on file  Tobacco Use  . Smoking status: Never Smoker  . Smokeless tobacco: Never Used  Substance and Sexual Activity  . Alcohol use: No  . Drug use: No  . Sexual activity: Yes    Birth control/protection: None  Lifestyle  . Physical activity:    Days per week: Not on file    Minutes per session: Not on file  . Stress: Not on file  Relationships  . Social connections:    Talks on phone: Not on file    Gets together: Not on file    Attends religious service: Not on file    Active member of club or organization: Not on file    Attends meetings of clubs or organizations: Not on file    Relationship status: Not on file  . Intimate partner violence:    Fear of current or ex partner: Not on file    Emotionally abused: Not on file    Physically abused: Not on file    Forced sexual activity: Not on file  Other Topics Concern  . Not on file  Social History Narrative  . Not on file   No family history on file.  OBJECTIVE:  Vitals:   01/14/18 1901  BP: 124/80  Pulse: 100  Resp: 18  Temp: 98.3 F (36.8 C)  SpO2: 100%     General appearance: alert; nontoxic appearance HEENT: Ears: LT EAC clear, TM pearly gray with visible cone of light, without erythema; RT EAC erythematous, RT TM injected, tender with auricle and tragal manipulation, Eyes: PERRL, EOMI grossly; Sinuses nontender to palpation; Nose: clear rhinorrhea; Throat: oropharynx clear, tonsils non-erythematous uvula midline Neck: RT sided pre and post-auricular LAD Lungs: CTAB without adventitious breath sounds Heart: regular rate and rhythm.  Radial pulses 2+ symmetrical bilaterally Skin: warm and dry Psychological: alert and cooperative; normal mood and affect   ASSESSMENT & PLAN:  1. Acute otitis externa of right ear, unspecified type   2. Non-recurrent  acute suppurative otitis media of right ear without spontaneous rupture of tympanic membrane   3. Right ear pain     Meds ordered this encounter  Medications  . amoxicillin-clavulanate (AUGMENTIN) 875-125 MG tablet    Sig: Take 1 tablet by mouth every 12 (twelve) hours for 10 days.    Dispense:  20 tablet    Refill:  0    Order Specific Question:   Supervising Provider    Answer:   Isa Rankin [phone]  . ciprofloxacin-hydrocortisone (CIPRO HC) OTIC suspension    Sig: Place 3 drops into the right ear 2 (two) times daily for 7 days.    Dispense:  10 mL    Refill:  0    Order Specific Question:   Supervising Provider    Answer:   Isa Rankin [[id number]]  . naproxen (NAPROSYN) 375 MG tablet    Sig: Take 1 tablet (375 mg total) by mouth 2 (two) times daily with a meal.    Dispense:  20 tablet    Refill:  0    Order Specific Question:   Supervising Provider    Answer:   Isa Rankin [[id number]]   Rest and drink plenty of fluids Prescribed augmentin 875 for 7-10 days Prescribed ciprofloxacin ear drops Prescribed naproxen as needed for pain Take medications as directed and to completion PCP assistance initiated Follow up with PCP if symptoms persists Return here or go to the ER if you have any new or worsening symptoms   Reviewed expectations re: course of current medical issues. Questions answered. Outlined signs and symptoms indicating need for more acute intervention. Patient verbalized understanding. After Visit Summary given.         Rennis Harding, PA-C 01/14/18 2012

## 2018-01-23 ENCOUNTER — Ambulatory Visit (HOSPITAL_COMMUNITY)
Admission: EM | Admit: 2018-01-23 | Discharge: 2018-01-23 | Disposition: A | Discharge status: Home / Self Care | Payer: Self-pay | Attending: Internal Medicine | Admitting: Internal Medicine

## 2018-01-23 ENCOUNTER — Encounter (HOSPITAL_COMMUNITY): Payer: Self-pay | Admitting: Emergency Medicine

## 2018-01-23 ENCOUNTER — Other Ambulatory Visit: Payer: Self-pay

## 2018-01-23 DIAGNOSIS — H60501 Unspecified acute noninfective otitis externa, right ear: Secondary | ICD-10-CM

## 2018-01-23 MED ORDER — NEOMYCIN-POLYMYXIN-HC 3.5-10000-1 OT SUSP: 3.0000 [drp] | Freq: Four times a day (QID) | OTIC | 0 refills | Status: AC

## 2018-01-23 NOTE — ED Provider Notes (Signed)
[facility]    CSN: [id number] Arrival date & time: 01/23/18  1504     History   Chief Complaint Chief Complaint  Patient presents with  . Otalgia    HPI ARIZONA SORN is a 27 y.o. female no contributing past medical history presenting today for evaluation of right ear pain.  Patient states that she was seen here approximately 10 days ago and treated for otitis externa and otitis externa.  She was unable to pick up the eardrops as they were too expensive, but she has been taking oral Augmentin.  She states that her symptoms were improving, but beginning Monday and Tuesday this week her symptoms began to worsen again.  She is noted to feel a lot of pressure and pain on her outer ear.  She has 1 day of antibiotics left.  Denies fever, denies associated URI symptoms of congestion, cough and sore throat.  She has been taking Naprosyn without relief.  HPI  Past Medical History:  Diagnosis Date  . Depression   . Miscarriage     There are no active problems to display for this patient.   Past Surgical History:  Procedure Laterality Date  . HERNIA REPAIR      OB History   None      Home Medications    Prior to Admission medications   Medication Sig Start Date End Date Taking? Authorizing Provider  amoxicillin-clavulanate (AUGMENTIN) 875-125 MG tablet Take 1 tablet by mouth every 12 (twelve) hours for 10 days. 01/14/18 01/24/18 Yes Wurst, [place], PA-C  naproxen (NAPROSYN) 375 MG tablet Take 1 tablet (375 mg total) by mouth 2 (two) times daily with a meal. 01/14/18  Yes Wurst, [place], PA-C  loperamide (IMODIUM) 2 MG capsule Take 1 capsule (2 mg total) by mouth 4 (four) times daily as needed for diarrhea or loose stools. Patient not taking: Reported on 01/14/2018 01/03/16   Ward, Layla Maw, DO  neomycin-polymyxin-hydrocortisone (CORTISPORIN) 3.5-10000-1 OTIC suspension Place 3 drops into the right ear 4 (four) times daily for 7 days. 01/23/18 01/30/18   Wieters, Hallie C, PA-C  nystatin cream (MYCOSTATIN) Apply to affected area 2 times daily Patient not taking: Reported on 01/14/2018 01/03/16   Ward, Layla Maw, DO  nystatin-triamcinolone (MYCOLOG II) cream Apply to affected area BID Patient not taking: Reported on 01/14/2018 05/26/16   Janne Napoleon, NP    Family History Family History  Problem Relation Age of Onset  . Diabetes Mother   . Hypertension Mother   . Hypertension Father     Social History Social History   Tobacco Use  . Smoking status: Never Smoker  . Smokeless tobacco: Never Used  Substance Use Topics  . Alcohol use: No  . Drug use: No     Allergies   Aripiprazole and Sulfa antibiotics   Review of Systems Review of Systems  Constitutional: Negative for activity change, appetite change, chills, fatigue and fever.  HENT: Positive for ear pain. Negative for congestion, rhinorrhea, sinus pressure, sore throat and trouble swallowing.   Eyes: Negative for discharge and redness.  Respiratory: Negative for cough, chest tightness and shortness of breath.   Cardiovascular: Negative for chest pain.  Gastrointestinal: Negative for abdominal pain, diarrhea, nausea and vomiting.  Musculoskeletal: Negative for myalgias.  Skin: Negative for rash.  Neurological: Negative for dizziness, light-headedness and headaches.     Physical Exam Triage Vital Signs ED Triage Vitals  Enc Vitals Group     BP 01/23/18 1514 124/86  Pulse Rate 01/23/18 1514 91     Resp 01/23/18 1514 18     Temp 01/23/18 1514 98.2 F (36.8 C)     Temp Source 01/23/18 1514 Oral     SpO2 01/23/18 1514 100 %     Weight 01/23/18 1516 240 lb (108.9 kg)     Height 01/23/18 1516 5\' 2"  (1.575 m)     Head Circumference --      Peak Flow --      Pain Score 01/23/18 1516 10     Pain Loc --      Pain Edu? --      Excl. in GC? --    No data found.  Updated Vital Signs BP 124/86 (BP Location: Left Arm)   Pulse 91   Temp 98.2 F (36.8 C) (Oral)    Resp 18   Ht 5\' 2"  (1.575 m)   Wt 240 lb (108.9 kg)   LMP 12/31/2017 (Exact Date)   SpO2 100%   BMI 43.90 kg/m   Visual Acuity Right Eye Distance:   Left Eye Distance:   Bilateral Distance:    Right Eye Near:   Left Eye Near:    Bilateral Near:     Physical Exam  Constitutional: She appears well-developed and well-nourished. No distress.  HENT:  Head: Normocephalic and atraumatic.  Right ear: Tenderness to palpation of external auricle, tragus on right side, mild erythema and swelling to EAC, TM nonerythematous Left ear: Nontender to palpation of external auricle, tragus, mastoid, EAC clear, without erythema or swelling, TM nonerythematous with good cone light  Oral mucosa pink and moist, no tonsillar enlargement or exudate. Posterior pharynx patent and nonerythematous, no uvula deviation or swelling. Normal phonation.  Eyes: Conjunctivae are normal.  Neck: Neck supple.  Cardiovascular: Normal rate and regular rhythm.  No murmur heard. Pulmonary/Chest: Effort normal and breath sounds normal. No respiratory distress.  Breathing comfortably at rest, CTABL, no wheezing, rales or other adventitious sounds auscultated  Abdominal: Soft. There is no tenderness.  Musculoskeletal: She exhibits no edema.  Neurological: She is alert.  Skin: Skin is warm and dry.  Psychiatric: She has a normal mood and affect.  Nursing note and vitals reviewed.    UC Treatments / Results  Labs (all labs ordered are listed, but only abnormal results are displayed) Labs Reviewed - No data to display  EKG None  Radiology No results found.  Procedures Procedures (including critical care time)  Medications Ordered in UC Medications - No data to display  Initial Impression / Assessment and Plan / UC Course  I have reviewed the triage vital signs and the nursing notes.  Pertinent labs & imaging results that were available during my care of the patient were reviewed by me and considered in my  medical decision making (see chart for details).     Patient with otitis externa, unable to previously fill Ciprodex eardrops due to cost, will provide Cortisporin as well as provide good Rx card.  Tylenol and ibuprofen for pain.  Return if symptoms continue to worsen or do not improve.Discussed strict return precautions. Patient verbalized understanding and is agreeable with plan.  Final Clinical Impressions(s) / UC Diagnoses   Final diagnoses:  Acute otitis externa of right ear, unspecified type     Discharge Instructions     Please finish course of Augmentin Please use cortisporin ear drops for outer ear infection Use anti-inflammatories for pain/swelling. You may take up to 800 mg Ibuprofen every 8 hours with  food. You may supplement Ibuprofen with Tylenol [phone] mg every 8 hours.   Please follow-up if pain persisting, not improving, developing swelling, decreased hearing    ED Prescriptions    Medication Sig Dispense Auth. Provider   neomycin-polymyxin-hydrocortisone (CORTISPORIN) 3.5-10000-1 OTIC suspension Place 3 drops into the right ear 4 (four) times daily for 7 days. 10 mL Wieters, Hallie C, PA-C     Controlled Substance Prescriptions [place] Controlled Substance Registry consulted? Not Applicable   Lew Dawes, [place] 01/23/18 1552

## 2018-01-23 NOTE — ED Triage Notes (Signed)
Patient states she was here for an ear ache, is almost finished with the antibiotic and her right ear is worse than it was before.

## 2018-01-23 NOTE — Discharge Instructions (Signed)
Please finish course of Augmentin Please use cortisporin ear drops for outer ear infection Use anti-inflammatories for pain/swelling. You may take up to 800 mg Ibuprofen every 8 hours with food. You may supplement Ibuprofen with Tylenol [phone] mg every 8 hours.   Please follow-up if pain persisting, not improving, developing swelling, decreased hearing

## 2018-06-02 ENCOUNTER — Ambulatory Visit (INDEPENDENT_AMBULATORY_CARE_PROVIDER_SITE_OTHER): Payer: Self-pay

## 2018-06-02 ENCOUNTER — Encounter (HOSPITAL_COMMUNITY): Payer: Self-pay | Admitting: Emergency Medicine

## 2018-06-02 ENCOUNTER — Ambulatory Visit (HOSPITAL_COMMUNITY)
Admission: EM | Admit: 2018-06-02 | Discharge: 2018-06-02 | Disposition: A | Discharge status: Home / Self Care | Payer: Self-pay | Attending: Family Medicine | Admitting: Family Medicine

## 2018-06-02 DIAGNOSIS — R059 Cough, unspecified: Secondary | ICD-10-CM

## 2018-06-02 DIAGNOSIS — R0602 Shortness of breath: Secondary | ICD-10-CM

## 2018-06-02 DIAGNOSIS — R69 Illness, unspecified: Secondary | ICD-10-CM

## 2018-06-02 DIAGNOSIS — R05 Cough: Secondary | ICD-10-CM

## 2018-06-02 DIAGNOSIS — Z833 Family history of diabetes mellitus: Secondary | ICD-10-CM | POA: Insufficient documentation

## 2018-06-02 DIAGNOSIS — Z8249 Family history of ischemic heart disease and other diseases of the circulatory system: Secondary | ICD-10-CM | POA: Insufficient documentation

## 2018-06-02 DIAGNOSIS — Z882 Allergy status to sulfonamides status: Secondary | ICD-10-CM | POA: Insufficient documentation

## 2018-06-02 DIAGNOSIS — Z888 Allergy status to other drugs, medicaments and biological substances status: Secondary | ICD-10-CM | POA: Insufficient documentation

## 2018-06-02 DIAGNOSIS — J111 Influenza due to unidentified influenza virus with other respiratory manifestations: Secondary | ICD-10-CM | POA: Insufficient documentation

## 2018-06-02 DIAGNOSIS — R0981 Nasal congestion: Secondary | ICD-10-CM

## 2018-06-02 LAB — POCT RAPID STREP A: STREPTOCOCCUS, GROUP A SCREEN (DIRECT): NEGATIVE

## 2018-06-02 MED ORDER — HYDROCODONE-HOMATROPINE 5-1.5 MG/5ML PO SYRP
5.0000 mL | ORAL_SOLUTION | Freq: Four times a day (QID) | ORAL | 0 refills | Status: DC | PRN
Start: 2018-06-02 — End: 2020-05-21

## 2018-06-02 NOTE — ED Triage Notes (Signed)
Pt c/o fever, congestion, headache x6 days, pt c/o fever of 102.6, taking tylenol around the clock.

## 2018-06-02 NOTE — ED Provider Notes (Signed)
[facility]   [id number] 06/02/18 Arrival Time: 1408  ASSESSMENT & PLAN:  1. Influenza-like illness   2. SOB (shortness of breath)   3. Cough    I have personally viewed the imaging studies ordered this visit. No evidence of pneumonia. Discussed and reassured.  Meds ordered this encounter  Medications  . HYDROcodone-homatropine (HYCODAN) 5-1.5 MG/5ML syrup    Sig: Take 5 mLs by mouth every 6 (six) hours as needed for cough.    Dispense:  60 mL    Refill:  0   See AVS for D/C instructions. Work note given. Cough medication sedation precautions. Discussed typical duration of symptoms. OTC symptom care as needed. Ensure adequate fluid intake and rest. May f/u with PCP or here as needed.  Reviewed expectations re: course of current medical issues. Questions answered. Outlined signs and symptoms indicating need for more acute intervention. Patient verbalized understanding. After Visit Summary given.   SUBJECTIVE: History from: patient.  Kristen Padilla is a 27 y.o. female who presents with complaint of nasal congestion, post-nasal drainage, and a persistent, mostly non-productive, cough. Onset abrupt, approx 5 days ago. Overall with fatigue and with body aches. SOB: "sometimes at night and with coughing". Wheezing: none. Fever: yes, up to 103 degrees F last evening; Tylenol and ibuprofen help. Overall decreased PO intake. Mild to moderate nausea without emesis. No abdominal or back pain. No urinary symptoms.Sick contacts: none known. No specific or significant aggravating or alleviating factors reported.  Received flu shot this year: no.  Social History   Tobacco Use  Smoking Status Never Smoker  Smokeless Tobacco Never Used    ROS: As per HPI.   OBJECTIVE:  Vitals:   06/02/18 1451  BP: 125/74  Pulse: (!) 118  Resp: 18  Temp: 99.7 F (37.6 C)  SpO2: 99%     General appearance: alert; appears fatigued HEENT: nasal congestion; clear  runny nose; throat irritation secondary to post-nasal drainage Neck: supple without LAD CV: tachycardia; regular Lungs: unlabored respirations, symmetrical air entry without wheezing; cough: moderate Skin: warm and dry Psychological: alert and cooperative; normal mood and affect  Imaging: Dg Chest 2 View  Result Date: 06/02/2018 CLINICAL DATA:  Nonproductive cough, shortness of breath, and fever for the past 5 days. Symptoms are worse when supine. Nonsmoker. EXAM: CHEST - 2 VIEW COMPARISON:  Report of a chest x-ray dated May 9th 2010 FINDINGS: The lungs are adequately inflated and clear. The heart and pulmonary vascularity are normal. The mediastinum is normal in width. There is no pleural effusion. The bony thorax exhibits no acute abnormality. IMPRESSION: There is no active cardiopulmonary disease. Electronically Signed   By: David  [place] M.D.   On: 06/02/2018 16:01     Allergies  Allergen Reactions  . Aripiprazole Rash  . Sulfa Antibiotics Rash    Past Medical History:  Diagnosis Date  . Depression   . Miscarriage    Family History  Problem Relation Age of Onset  . Diabetes Mother   . Hypertension Mother   . Hypertension Father    Social History   Socioeconomic History  . Marital status: Married    Spouse name: Not on file  . Number of children: Not on file  . Years of education: Not on file  . Highest education level: Not on file  Occupational History  . Not on file  Social Needs  . Financial resource strain: Not on file  . Food insecurity:    Worry: Not on file  Inability: Not on file  . Transportation needs:    Medical: Not on file    Non-medical: Not on file  Tobacco Use  . Smoking status: Never Smoker  . Smokeless tobacco: Never Used  Substance and Sexual Activity  . Alcohol use: No  . Drug use: No  . Sexual activity: Yes    Birth control/protection: None  Lifestyle  . Physical activity:    Days per week: Not on file    Minutes per session: Not  on file  . Stress: Not on file  Relationships  . Social connections:    Talks on phone: Not on file    Gets together: Not on file    Attends religious service: Not on file    Active member of club or organization: Not on file    Attends meetings of clubs or organizations: Not on file    Relationship status: Not on file  . Intimate partner violence:    Fear of current or ex partner: Not on file    Emotionally abused: Not on file    Physically abused: Not on file    Forced sexual activity: Not on file  Other Topics Concern  . Not on file  Social History Narrative  . Not on file           Mardella LaymanHagler, Safal Halderman, MD 06/02/18 [phone]

## 2018-06-02 NOTE — Discharge Instructions (Signed)
Be aware, your cough medication may cause drowsiness. Please do not drive, operate heavy machinery or make important decisions while on this medication, it can cloud your judgement. ° °Follow up with your primary care doctor or here if you are not seeing improvement of your symptoms over the next several days, sooner if you feel you are worsening. ° °Caring for yourself: °Get plenty of rest. °Drink plenty of fluids, enough so that your urine is light yellow or clear like water. If you have kidney, heart, or liver disease and have to limit fluids, talk with your doctor before you increase the amount of fluids you drink. °Take an over-the-counter pain medicine if needed, such as acetaminophen (Tylenol), ibuprofen (Advil, Motrin), or naproxen (Aleve), to relieve fever, headache, and muscle aches. Read and follow all instructions on the label. No one younger than 20 should take aspirin. It has been linked to Reye syndrome, a serious illness. °Before you use over the counter cough and cold medicines, check the label. These medicines may not be safe for children younger than age 6 or for people with certain health problems. °If the skin around your nose and lips becomes sore, put some petroleum jelly on the area. ° °Avoid spreading a viral respiratory illness: °Wash your hands regularly, and keep your hands away from your face.  °Stay home from school, work, and other public places until you are feeling better and your fever has been gone for at least 24 hours. The fever needs to have gone away on its own without the help of medicine. °

## 2018-06-05 LAB — CULTURE, GROUP A STREP (THRC)

## 2019-01-03 ENCOUNTER — Ambulatory Visit (INDEPENDENT_AMBULATORY_CARE_PROVIDER_SITE_OTHER): Payer: Self-pay

## 2019-01-03 ENCOUNTER — Ambulatory Visit (HOSPITAL_COMMUNITY)
Admission: EM | Admit: 2019-01-03 | Discharge: 2019-01-03 | Disposition: A | Discharge status: Home / Self Care | Payer: Self-pay | Attending: Family Medicine | Admitting: Family Medicine

## 2019-01-03 ENCOUNTER — Encounter (HOSPITAL_COMMUNITY): Payer: Self-pay

## 2019-01-03 ENCOUNTER — Other Ambulatory Visit: Payer: Self-pay

## 2019-01-03 DIAGNOSIS — N926 Irregular menstruation, unspecified: Secondary | ICD-10-CM

## 2019-01-03 DIAGNOSIS — Z3202 Encounter for pregnancy test, result negative: Secondary | ICD-10-CM

## 2019-01-03 DIAGNOSIS — S9032XA Contusion of left foot, initial encounter: Secondary | ICD-10-CM

## 2019-01-03 LAB — POCT PREGNANCY, URINE: Preg Test, Ur: NEGATIVE

## 2019-01-03 NOTE — ED Triage Notes (Signed)
Pt C/O right foot injury, pt states she fall in a parking lot last night.

## 2019-01-03 NOTE — Discharge Instructions (Addendum)
Your urine pregnancy test was negative today.  Your foot x-ray was normal.   You can take ibuprofen as needed for discomfort.  Rest and elevate your foot.  You can apply ice packs 2-3 times per day.  Wear the Ace wrap for comfort.  Return here if you develop worsening pain, numbness, tingling, or weakness.

## 2019-01-03 NOTE — ED Provider Notes (Signed)
[place]    CSN: [id number] Arrival date & time: 01/03/19  1031     History   Chief Complaint Chief Complaint  Patient presents with  . Foot Injury    HPI Kristen Padilla is a 28 y.o. female.   She presents with left foot pain and swelling after falling last evening in a parking lot.  She states she tripped and fell; she denies head injury or loss of consciousness.  LMP: April 2020, patient states she has irregular periods and has had negative at home pregnancy test; test ordered here prior to x-ray.    The history is provided by the patient.    Past Medical History:  Diagnosis Date  . Depression   . Miscarriage     There are no active problems to display for this patient.   Past Surgical History:  Procedure Laterality Date  . HERNIA REPAIR      OB History   No obstetric history on file.      Home Medications    Prior to Admission medications   Medication Sig Start Date End Date Taking? Authorizing Provider  HYDROcodone-homatropine (HYCODAN) 5-1.5 MG/5ML syrup Take 5 mLs by mouth every 6 (six) hours as needed for cough. 06/02/18   Vanessa Kick, MD  loperamide (IMODIUM) 2 MG capsule Take 1 capsule (2 mg total) by mouth 4 (four) times daily as needed for diarrhea or loose stools. Patient not taking: Reported on 01/14/2018 01/03/16   Ward, Delice Bison, DO  naproxen (NAPROSYN) 375 MG tablet Take 1 tablet (375 mg total) by mouth 2 (two) times daily with a meal. Patient not taking: Reported on 06/02/2018 01/14/18   Stacey Drain, [place], PA-C  nystatin cream (MYCOSTATIN) Apply to affected area 2 times daily Patient not taking: Reported on 01/14/2018 01/03/16   Ward, Delice Bison, DO  nystatin-triamcinolone (MYCOLOG II) cream Apply to affected area BID Patient not taking: Reported on 01/14/2018 05/26/16   Ashley Murrain, NP    Family History Family History  Problem Relation Age of Onset  . Diabetes Mother   . Hypertension Mother   . Hypertension Father     Social History Social History   Tobacco Use  . Smoking status: Never Smoker  . Smokeless tobacco: Never Used  Substance Use Topics  . Alcohol use: No  . Drug use: No     Allergies   Aripiprazole and Sulfa antibiotics   Review of Systems Review of Systems  Constitutional: Negative for chills and fever.  HENT: Negative for ear pain and sore throat.   Eyes: Negative for pain and visual disturbance.  Respiratory: Negative for cough and shortness of breath.   Cardiovascular: Negative for chest pain and palpitations.  Gastrointestinal: Negative for abdominal pain and vomiting.  Genitourinary: Negative for dysuria and hematuria.  Musculoskeletal: Positive for arthralgias. Negative for back pain.  Skin: Negative for color change and rash.  Neurological: Negative for dizziness, seizures, syncope, speech difficulty, weakness, numbness and headaches.  All other systems reviewed and are negative.    Physical Exam Triage Vital Signs ED Triage Vitals [01/03/19 1051]  Enc Vitals Group     BP 113/75     Pulse Rate 88     Resp 18     Temp 98.6 F (37 C)     Temp Source Oral     SpO2 97 %     Weight      Height      Head Circumference  Peak Flow      Pain Score 8     Pain Loc      Pain Edu?      Excl. in GC?    No data found.  Updated Vital Signs BP 113/75 (BP Location: Left Arm)   Pulse 88   Temp 98.6 F (37 C) (Oral)   Resp 18   LMP 09/30/2018 (Approximate) Comment: pregnancy test 01/03/2019 negative  SpO2 97%   Visual Acuity Right Eye Distance:   Left Eye Distance:   Bilateral Distance:    Right Eye Near:   Left Eye Near:    Bilateral Near:     Physical Exam Vitals signs and nursing note reviewed.  Constitutional:      General: She is not in acute distress.    Appearance: She is well-developed.  HENT:     Head: Normocephalic and atraumatic.  Eyes:     Conjunctiva/sclera: Conjunctivae normal.  Neck:     Musculoskeletal: Neck supple.   Cardiovascular:     Rate and Rhythm: Normal rate and regular rhythm.     Heart sounds: No murmur.  Pulmonary:     Effort: Pulmonary effort is normal. No respiratory distress.     Breath sounds: Normal breath sounds.  Abdominal:     Palpations: Abdomen is soft.     Tenderness: There is no abdominal tenderness.  Musculoskeletal:     Comments: Left foot and lateral ankle: Tender to palpation, minimal edema, FROM, sensation intact, 2+ pulses.  Skin:    General: Skin is warm and dry.  Neurological:     General: No focal deficit present.     Mental Status: She is alert and oriented to person, place, and time.     Sensory: No sensory deficit.     Motor: No weakness.     Coordination: Coordination normal.     Gait: Gait normal.     Deep Tendon Reflexes: Reflexes normal.      UC Treatments / Results  Labs (all labs ordered are listed, but only abnormal results are displayed) Labs Reviewed  POC URINE PREG, ED  POCT PREGNANCY, URINE    EKG   Radiology Dg Foot Complete Left  Result Date: 01/03/2019 CLINICAL DATA:  Larey SeatFell yesterday.  Twisting injury.  Left foot pain. EXAM: LEFT FOOT - COMPLETE 3+ VIEW COMPARISON:  None. FINDINGS: The joint spaces are maintained.  No acute fracture is identified. IMPRESSION: No acute bony findings. Electronically Signed   By: Rudie MeyerP.  Gallerani M.D.   On: 01/03/2019 11:28    Procedures Procedures (including critical care time)  Medications Ordered in UC Medications - No data to display  Initial Impression / Assessment and Plan / UC Course  I have reviewed the triage vital signs and the nursing notes.  Pertinent labs & imaging results that were available during my care of the patient were reviewed by me and considered in my medical decision making (see chart for details).   Contusion of left foot.  Urine pregnancy negative today.  X-ray negative. Treating today with Ace wrap, RICE, ibuprofen as needed.  Discussed with patient that she would need to  return here if she develops worsening pain, swelling, numbness, or weakness.   Final Clinical Impressions(s) / UC Diagnoses   Final diagnoses:  Contusion of left foot, initial encounter     Discharge Instructions     Your urine pregnancy test was negative today.  Your foot x-ray was normal.   You can take ibuprofen as needed  for discomfort.  Rest and elevate your foot.  You can apply ice packs 2-3 times per day.  Wear the Ace wrap for comfort.  Return here if you develop worsening pain, numbness, tingling, or weakness.    ED Prescriptions    None     Controlled Substance Prescriptions [place] Controlled Substance Registry consulted? Not Applicable   Mickie Bailate, Orlena Garmon H, NP 01/03/19 1144

## 2019-04-14 ENCOUNTER — Telehealth: Payer: Self-pay | Admitting: Family

## 2019-04-14 DIAGNOSIS — Z20828 Contact with and (suspected) exposure to other viral communicable diseases: Secondary | ICD-10-CM

## 2019-04-14 DIAGNOSIS — Z20822 Contact with and (suspected) exposure to covid-19: Secondary | ICD-10-CM

## 2019-04-14 MED ORDER — BENZONATATE 100 MG PO CAPS: 100.0000 mg | ORAL_CAPSULE | Freq: Three times a day (TID) | ORAL | 0 refills | Status: DC | PRN

## 2019-04-14 NOTE — Progress Notes (Signed)
E-Visit for Corona Virus Screening   Your current symptoms could be consistent with the coronavirus.  Many health care providers can now test patients at their office but not all are.  [place] has multiple testing sites. For information on our COVID testing locations and hours go to [email or web address]  Please quarantine yourself while awaiting your test results.  We are enrolling you in our MyChart Home Montioring for COVID19 . Daily you will receive a questionnaire within the MyChart website. Our COVID 19 response team willl be monitoriing your responses daily.  You can go to one of the  testing sites listed below, while they are opened (see hours). You do not need an order and will stay in your car during the test. You do need to self isolate until your results return and if positive 14 days from when your symptoms started and until you are 3 days symptom free.   Testing Locations (Monday - Friday, 8 a.m. - 3:30 p.m.) . [place]: [facility], [address], [place], [place]  . [place]: [facility], [address], [place], [place] (entrance off [address])  . [place]: (Closed each Monday): Testing site relocated to the short stay covered drive at [facility]. (Use the [address] entrance to [facility] next to [facility].)   COVID-19 is a respiratory illness with symptoms that are similar to the flu. Symptoms are typically mild to moderate, but there have been cases of severe illness and death due to the virus. The following symptoms may appear 2-14 days after exposure: . Fever . Cough . Shortness of breath or difficulty breathing . Chills . Repeated shaking with chills . Muscle pain . Headache . Sore throat . New loss of taste or smell . Fatigue . Congestion or runny nose . Nausea or vomiting . Diarrhea  It is vitally important that if you feel that you  have an infection such as this virus or any other virus that you stay home and away from places where you may spread it to others.  You should self-quarantine for 14 days if you have symptoms that could potentially be coronavirus or have been in close contact a with a person diagnosed with COVID-19 within the last 2 weeks. You should avoid contact with people age 65 and older.   You should wear a mask or cloth face covering over your nose and mouth if you must be around other people or animals, including pets (even at home). Try to stay at least 6 feet away from other people. This will protect the people around you.  You can use medication such as A prescription cough medication called Tessalon Perles 100 mg. You may take 1-2 capsules every 8 hours as needed for cough.  You may also take acetaminophen (Tylenol) as needed for fever.   Reduce your risk of any infection by using the same precautions used for avoiding the common cold or flu:  . Wash your hands often with soap and warm water for at least 20 seconds.  If soap and water are not readily available, use an alcohol-based hand sanitizer with at least 60% alcohol.  . If coughing or sneezing, cover your mouth and nose by coughing or sneezing into the elbow areas of your shirt or coat, into a tissue or into your sleeve (not your hands). . Avoid shaking hands with others and consider head nods or verbal greetings only. . Avoid touching   your eyes, nose, or mouth with unwashed hands.  . Avoid close contact with people who are sick. . Avoid places or events with large numbers of people in one location, like concerts or sporting events. . Carefully consider travel plans you have or are making. . If you are planning any travel outside or inside the [place], visit the CDC's Travelers' Health webpage for the latest health notices. . If you have some symptoms but not all symptoms, continue to monitor at home and seek medical attention if your symptoms  worsen. . If you are having a medical emergency, call 911.  HOME CARE . Only take medications as instructed by your medical team. . Drink plenty of fluids and get plenty of rest. . A steam or ultrasonic humidifier can help if you have congestion.   GET HELP RIGHT AWAY IF YOU HAVE EMERGENCY WARNING SIGNS** FOR COVID-19. If you or someone is showing any of these signs seek emergency medical care immediately. Call 911 or proceed to your closest emergency facility if: . You develop worsening high fever. . Trouble breathing . Bluish lips or face . Persistent pain or pressure in the chest . New confusion . Inability to wake or stay awake . You cough up blood. . Your symptoms become more severe  **This list is not all possible symptoms. Contact your medical provider for any symptoms that are sever or concerning to you.   MAKE SURE YOU   Understand these instructions.  Will watch your condition.  Will get help right away if you are not doing well or get worse.  Your e-visit answers were reviewed by a board certified advanced clinical practitioner to complete your personal care plan.  Depending on the condition, your plan could have included both over the counter or prescription medications.  If there is a problem please reply once you have received a response from your provider.  Your safety is important to us.  If you have drug allergies check your prescription carefully.    You can use MyChart to ask questions about today's visit, request a non-urgent call back, or ask for a work or school excuse for 24 hours related to this e-Visit. If it has been greater than 24 hours you will need to follow up with your provider, or enter a new e-Visit to address those concerns. You will get an e-mail in the next two days asking about your experience.  I hope that your e-visit has been valuable and will speed your recovery. Thank you for using e-visits.   Approximately 5 minutes was spent  documenting and reviewing patient's chart.   

## 2020-03-01 HISTORY — PX: OTHER SURGICAL HISTORY: SHX169

## 2020-03-28 ENCOUNTER — Emergency Department (HOSPITAL_COMMUNITY): Admission: EM | Admit: 2020-03-28 | Discharge: 2020-03-28 | Discharge status: Against Medical Advice | Payer: Self-pay

## 2020-03-28 ENCOUNTER — Other Ambulatory Visit: Payer: Self-pay

## 2020-03-28 NOTE — ED Notes (Signed)
Pt is leaving. 

## 2020-03-29 DIAGNOSIS — S52502A Unspecified fracture of the lower end of left radius, initial encounter for closed fracture: Secondary | ICD-10-CM | POA: Insufficient documentation

## 2020-03-29 HISTORY — DX: Unspecified fracture of the lower end of left radius, initial encounter for closed fracture: S52.502A

## 2020-05-21 ENCOUNTER — Other Ambulatory Visit: Payer: Self-pay

## 2020-05-21 ENCOUNTER — Inpatient Hospital Stay (HOSPITAL_COMMUNITY)
Admission: EM | Admit: 2020-05-21 | Discharge: 2020-05-28 | DRG: 918 | Disposition: A | Discharge status: Home / Self Care | Payer: Self-pay | Attending: Internal Medicine | Admitting: Internal Medicine

## 2020-05-21 ENCOUNTER — Encounter (HOSPITAL_COMMUNITY): Payer: Self-pay

## 2020-05-21 DIAGNOSIS — F4310 Post-traumatic stress disorder, unspecified: Secondary | ICD-10-CM | POA: Diagnosis present

## 2020-05-21 DIAGNOSIS — Z20822 Contact with and (suspected) exposure to covid-19: Secondary | ICD-10-CM | POA: Diagnosis present

## 2020-05-21 DIAGNOSIS — F329 Major depressive disorder, single episode, unspecified: Secondary | ICD-10-CM | POA: Diagnosis present

## 2020-05-21 DIAGNOSIS — F339 Major depressive disorder, recurrent, unspecified: Secondary | ICD-10-CM

## 2020-05-21 DIAGNOSIS — T50901A Poisoning by unspecified drugs, medicaments and biological substances, accidental (unintentional), initial encounter: Secondary | ICD-10-CM | POA: Diagnosis present

## 2020-05-21 DIAGNOSIS — F32A Depression, unspecified: Secondary | ICD-10-CM | POA: Diagnosis present

## 2020-05-21 DIAGNOSIS — T485X1A Poisoning by other anti-common-cold drugs, accidental (unintentional), initial encounter: Secondary | ICD-10-CM | POA: Diagnosis present

## 2020-05-21 DIAGNOSIS — T391X2A Poisoning by 4-Aminophenol derivatives, intentional self-harm, initial encounter: Principal | ICD-10-CM | POA: Diagnosis present

## 2020-05-21 DIAGNOSIS — R197 Diarrhea, unspecified: Secondary | ICD-10-CM | POA: Diagnosis not present

## 2020-05-21 DIAGNOSIS — Z818 Family history of other mental and behavioral disorders: Secondary | ICD-10-CM

## 2020-05-21 DIAGNOSIS — Z6841 Body Mass Index (BMI) 40.0 and over, adult: Secondary | ICD-10-CM

## 2020-05-21 DIAGNOSIS — Z882 Allergy status to sulfonamides status: Secondary | ICD-10-CM

## 2020-05-21 DIAGNOSIS — Y92009 Unspecified place in unspecified non-institutional (private) residence as the place of occurrence of the external cause: Secondary | ICD-10-CM

## 2020-05-21 DIAGNOSIS — T1491XA Suicide attempt, initial encounter: Secondary | ICD-10-CM

## 2020-05-21 DIAGNOSIS — D72829 Elevated white blood cell count, unspecified: Secondary | ICD-10-CM | POA: Diagnosis present

## 2020-05-21 DIAGNOSIS — Z888 Allergy status to other drugs, medicaments and biological substances status: Secondary | ICD-10-CM

## 2020-05-21 HISTORY — DX: Poisoning by 4-aminophenol derivatives, intentional self-harm, initial encounter: T39.1X2A

## 2020-05-21 HISTORY — DX: Morbid (severe) obesity due to excess calories: E66.01

## 2020-05-21 HISTORY — DX: Elevated white blood cell count, unspecified: D72.829

## 2020-05-21 LAB — RAPID URINE DRUG SCREEN, HOSP PERFORMED
Amphetamines: NOT DETECTED
Barbiturates: NOT DETECTED
Benzodiazepines: NOT DETECTED
Cocaine: NOT DETECTED
Opiates: NOT DETECTED
Tetrahydrocannabinol: NOT DETECTED

## 2020-05-21 LAB — RESPIRATORY PANEL BY RT PCR (FLU A&B, COVID)
Influenza A by PCR: NEGATIVE
Influenza B by PCR: NEGATIVE
SARS Coronavirus 2 by RT PCR: NEGATIVE

## 2020-05-21 LAB — COMPREHENSIVE METABOLIC PANEL
ALT: 26 U/L (ref 0–44)
AST: 31 U/L (ref 15–41)
Albumin: 3.4 g/dL — ABNORMAL LOW (ref 3.5–5.0)
Alkaline Phosphatase: 59 U/L (ref 38–126)
Anion gap: 8 (ref 5–15)
BUN: 9 mg/dL (ref 6–20)
CO2: 25 mmol/L (ref 22–32)
Calcium: 9 mg/dL (ref 8.9–10.3)
Chloride: 106 mmol/L (ref 98–111)
Creatinine, Ser: 0.87 mg/dL (ref 0.44–1.00)
GFR, Estimated: >60 mL/min (ref >60)
Glucose, Bld: 120 mg/dL — ABNORMAL HIGH (ref 70–99)
Potassium: 4.6 mmol/L (ref 3.5–5.1)
Sodium: 139 mmol/L (ref 135–145)
Total Bilirubin: 0.4 mg/dL (ref 0.3–1.2)
Total Protein: 6.7 g/dL (ref 6.5–8.1)

## 2020-05-21 LAB — CBC WITH DIFFERENTIAL/PLATELET
Abs Immature Granulocytes: 0.07 10*3/uL (ref 0.00–0.07)
Basophils Absolute: 0.1 10*3/uL (ref 0.0–0.1)
Basophils Relative: 1 %
Eosinophils Absolute: 0.2 10*3/uL (ref 0.0–0.5)
Eosinophils Relative: 2 %
HCT: 42.5 % (ref 36.0–46.0)
Hemoglobin: 14.2 g/dL (ref 12.0–15.0)
Immature Granulocytes: 1 %
Lymphocytes Relative: 28 %
Lymphs Abs: 3.3 10*3/uL (ref 0.7–4.0)
MCH: 30 pg (ref 26.0–34.0)
MCHC: 33.4 g/dL (ref 30.0–36.0)
MCV: 89.9 fL (ref 80.0–100.0)
Monocytes Absolute: 0.7 10*3/uL (ref 0.1–1.0)
Monocytes Relative: 6 %
Neutro Abs: 7.5 10*3/uL (ref 1.7–7.7)
Neutrophils Relative %: 62 %
Platelets: 369 10*3/uL (ref 150–400)
RBC: 4.73 MIL/uL (ref 3.87–5.11)
RDW: 12.2 % (ref 11.5–15.5)
WBC: 11.9 10*3/uL — ABNORMAL HIGH (ref 4.0–10.5)
nRBC: 0 % (ref 0.0–0.2)

## 2020-05-21 LAB — I-STAT BETA HCG BLOOD, ED (MC, WL, AP ONLY): I-stat hCG, quantitative: <5 m[IU]/mL (ref <5)

## 2020-05-21 LAB — ACETAMINOPHEN LEVEL
Acetaminophen (Tylenol), Serum: 63 ug/mL — ABNORMAL HIGH (ref 10–30)
Acetaminophen (Tylenol), Serum: 150 ug/mL — ABNORMAL HIGH (ref 10–30)

## 2020-05-21 LAB — ETHANOL: Alcohol, Ethyl (B): <10 mg/dL (ref <10)

## 2020-05-21 LAB — PROTIME-INR
INR: 1.1 (ref 0.8–1.2)
Prothrombin Time: 13.6 seconds (ref 11.4–15.2)

## 2020-05-21 LAB — SALICYLATE LEVEL: Salicylate Lvl: <7 mg/dL — ABNORMAL LOW (ref 7.0–30.0)

## 2020-05-21 LAB — APTT: aPTT: 26 seconds (ref 24–36)

## 2020-05-21 LAB — HIV ANTIBODY (ROUTINE TESTING W REFLEX): HIV Screen 4th Generation wRfx: NONREACTIVE

## 2020-05-21 MED ORDER — SODIUM CHLORIDE 0.9% FLUSH
3.0000 mL | Freq: Two times a day (BID) | INTRAVENOUS | Status: DC
  Administered 2020-05-21: 2.5 mL via INTRAVENOUS
  Administered 2020-05-21 – 2020-05-25 (×5): 3 mL via INTRAVENOUS
  Administered 2020-05-25: 2.5 mL via INTRAVENOUS
  Administered 2020-05-26 – 2020-05-27 (×4): 3 mL via INTRAVENOUS

## 2020-05-21 MED ORDER — ONDANSETRON HCL 4 MG/2ML IJ SOLN: 4.0000 mg | Freq: Four times a day (QID) | INTRAMUSCULAR | Status: DC | PRN

## 2020-05-21 MED ORDER — ACETYLCYSTEINE 20 % IN SOLN: 70.0000 mg/kg | Freq: Once | RESPIRATORY_TRACT | Status: DC

## 2020-05-21 MED ORDER — METOPROLOL TARTRATE 5 MG/5ML IV SOLN: 5.0000 mg | Freq: Four times a day (QID) | INTRAVENOUS | Status: DC | PRN

## 2020-05-21 MED ORDER — ACETYLCYSTEINE 20 % IN SOLN
70.0000 mg/kg | RESPIRATORY_TRACT | Status: DC
  Filled 2020-05-21: qty 60

## 2020-05-21 MED ORDER — ACETYLCYSTEINE 20 % IN SOLN
70.0000 mg/kg | RESPIRATORY_TRACT | Status: DC
  Administered 2020-05-21 – 2020-05-22 (×5): 7940 mg via ORAL
  Filled 2020-05-21 (×9): qty 60

## 2020-05-21 MED ORDER — ENOXAPARIN SODIUM 40 MG/0.4ML ~~LOC~~ SOLN: 40.0000 mg | Freq: Every day | SUBCUTANEOUS | Status: DC

## 2020-05-21 MED ORDER — ACETYLCYSTEINE 20 % IN SOLN
140.0000 mg/kg | Freq: Once | RESPIRATORY_TRACT | Status: AC
  Administered 2020-05-21: 15880 mg via ORAL
  Filled 2020-05-21: qty 90

## 2020-05-21 MED ORDER — ONDANSETRON HCL 4 MG PO TABS: 4.0000 mg | ORAL_TABLET | Freq: Four times a day (QID) | ORAL | Status: DC | PRN

## 2020-05-21 MED ORDER — ENOXAPARIN SODIUM 60 MG/0.6ML ~~LOC~~ SOLN
55.0000 mg | Freq: Every day | SUBCUTANEOUS | Status: DC
  Administered 2020-05-22 – 2020-05-24 (×3): 55 mg via SUBCUTANEOUS
  Filled 2020-05-21: qty 0.55
  Filled 2020-05-21 (×3): qty 0.6

## 2020-05-21 NOTE — ED Provider Notes (Signed)
[facility] EMERGENCY DEPARTMENT Provider Note   CSN: [id number] Arrival date & time: 05/21/20  0263     History Chief Complaint  Patient presents with  . Drug Overdose  . Suicide Attempt    AISLEE LANDGREN is a 29 y.o. female.  29 year old female who presents after a suicide attempt with drug overdose. Patient states that she is had significant amount of stress and depression recently. No history of suicide attempts but somewhere between midnight and 1:00 she took 3500 mg Tylenols and drank a whole bottle of NyQuil. Presents here via EMS for further evaluation. She states that she feels "out of it and funny" but no other significant abnormalities at this time. She agrees to stay voluntarily.   Drug Overdose       Past Medical History:  Diagnosis Date  . Depression   . Miscarriage     There are no problems to display for this patient.   Past Surgical History:  Procedure Laterality Date  . HERNIA REPAIR       OB History   No obstetric history on file.     Family History  Problem Relation Age of Onset  . Diabetes Mother   . Hypertension Mother   . Hypertension Father     Social History   Tobacco Use  . Smoking status: Never Smoker  . Smokeless tobacco: Never Used  Substance Use Topics  . Alcohol use: No  . Drug use: No    Home Medications Prior to Admission medications   Not on File    Allergies    Aripiprazole and Sulfa antibiotics  Review of Systems   Review of Systems  All other systems reviewed and are negative.   Physical Exam Updated Vital Signs BP (!) 148/99   Pulse 71   Temp 97.9 F (36.6 C) (Oral)   Resp (!) 23   Ht 5\' 2"  (1.575 m)   Wt 113.4 kg   SpO2 97%   BMI 45.73 kg/m   Physical Exam Vitals and nursing note reviewed.  Constitutional:      Appearance: She is well-developed.  HENT:     Head: Normocephalic and atraumatic.     Mouth/Throat:     Mouth: Mucous membranes are moist.      Pharynx: Oropharynx is clear.  Eyes:     Pupils: Pupils are equal, round, and reactive to light.  Cardiovascular:     Rate and Rhythm: Normal rate and regular rhythm.  Pulmonary:     Effort: No respiratory distress.     Breath sounds: No stridor.  Abdominal:     General: Abdomen is flat. There is no distension.  Musculoskeletal:        General: No swelling or tenderness. Normal range of motion.     Cervical back: Normal range of motion.  Skin:    General: Skin is warm and dry.  Neurological:     General: No focal deficit present.     Mental Status: She is alert.  Psychiatric:        Mood and Affect: Affect is flat.        Thought Content: Thought content includes suicidal ideation. Thought content does not include suicidal plan.     ED Results / Procedures / Treatments   Labs (all labs ordered are listed, but only abnormal results are displayed) Labs Reviewed  COMPREHENSIVE METABOLIC PANEL - Abnormal; Notable for the following components:      Result Value   Glucose, Bld  120 (*)    Albumin 3.4 (*)    All other components within normal limits  CBC WITH DIFFERENTIAL/PLATELET - Abnormal; Notable for the following components:   WBC 11.9 (*)    All other components within normal limits  ACETAMINOPHEN LEVEL - Abnormal; Notable for the following components:   Acetaminophen (Tylenol), Serum 63 (*)    All other components within normal limits  SALICYLATE LEVEL - Abnormal; Notable for the following components:   Salicylate Lvl <7.0 (*)    All other components within normal limits  ACETAMINOPHEN LEVEL - Abnormal; Notable for the following components:   Acetaminophen (Tylenol), Serum 150 (*)    All other components within normal limits  RESPIRATORY PANEL BY RT PCR (FLU A&B, COVID)  ETHANOL  RAPID URINE DRUG SCREEN, HOSP PERFORMED  I-STAT BETA HCG BLOOD, ED ([facility], [facility], [facility] ONLY)    EKG EKG Interpretation  Date/Time:  Sunday May 21 2020 04:26:25 EST Ventricular Rate:  87 PR  Interval:    QRS Duration: 80 QT Interval:  341 QTC Calculation: 411 R Axis:   19 Text Interpretation: Sinus rhythm No acute changes Confirmed by Marily Memos [phone]) on 05/21/2020 5:44:02 AM   Radiology No results found.  Procedures .Critical Care Performed by: Marily Memos, MD Authorized by: Marily Memos, MD   Critical care provider statement:    Critical care time (minutes):  45   Critical care was necessary to treat or prevent imminent or life-threatening deterioration of the following conditions:  Toxidrome   Critical care was time spent personally by me on the following activities:  Discussions with consultants, evaluation of patient's response to treatment, examination of patient, ordering and performing treatments and interventions, ordering and review of laboratory studies, ordering and review of radiographic studies, pulse oximetry, re-evaluation of patient's condition, obtaining history from patient or surrogate and review of old charts   (including critical care time)  Medications Ordered in ED Medications  acetylcysteine (MUCOMYST) 20 % nebulizer / oral solution 15,880 mg (has no administration in time range)    Followed by  acetylcysteine (MUCOMYST) 20 % nebulizer / oral solution 7,940 mg (has no administration in time range)    ED Course  I have reviewed the triage vital signs and the nursing notes.  Pertinent labs & imaging results that were available during my care of the patient were reviewed by me and considered in my medical decision making (see chart for details).    MDM Rules/Calculators/A&P                          Discussed with poison control. This is has any evidence of anticholinergic on exam. Her vital signs are okay. EKG is okay. Her Tylenol level approximately 4-4 and half hours after ingestion is 150 so we will go and initiate Mucomyst. Medicine consulted for admission. Patient still not IVCed at this point as she agrees to stay voluntarily.  But absolutely commit her if she tried to leave.  Final Clinical Impression(s) / ED Diagnoses Final diagnoses:  Suicide attempt [facility])  Intentional acetaminophen overdose, initial encounter [facility])    Rx / DC Orders ED Discharge Orders    None       Kaniel Kiang, Barbara Cower, MD 05/21/20 [phone]

## 2020-05-21 NOTE — ED Notes (Addendum)
This RN spoke with Poison Control & updated the Day shift RN Eileen Stanford RN [phone]) about this pt that they are following. The only recommendations from them during this conversation was to retime the next Mucomyst dose for 1100 & maintains it q4 afterwards & when the last 0300 dose is given to recheck the LFT at the same time. EDP & pharmacist will be made aware.

## 2020-05-21 NOTE — H&P (Addendum)
History and Physical    Kristen Padilla TGY:[id number] DOB: 12-Jul-1990 DOA: 05/21/2020  Referring MD/NP/PA: Marily Memos, MD PCP: Patient, No Pcp Per  Patient coming from: Via EMS  Chief Complaint: Suicide attempt  I have personally briefly reviewed patient's old medical records in [facility]   HPI: Kristen Padilla is a 29 y.o. female with medical history significant of depression and morbid obesity presents after trying to harm herself by taking anywhere from 20-30 500 mg tablets of Tylenol between 12-1 AM this morning and a bottle of NyQuil.  Patient notes that she has had a lot of stress and been very depressed lately.  Normally she just tries to follow-up everything inside.  She has not been on any medication for treatment of depression in quite some time.  Since taking the medication she notes associated headache, generalized abdominal pain, hot, sweaty, feeling tired.  Denies having any nausea, vomiting, diarrhea, cough, shortness of breath, dysuria, urinary frequency.  Family history is significant for mother having depression as well.  ED Course: Upon admission into the emergency department patient was seen to be afebrile with respirations 18-25, blood pressure 107/62-148/89 and other vital signs stable.  Labs were significant for WBC 11.9, LFTs within normal limits, acetaminophen level 63->150, and salicylate level less than 7.  UDS was negative.  Poison control had been contacted and it was recommended to initiate Mucomyst due to the rising acetaminophen levels.  TRH called to admit.  Review of Systems  Constitutional: Positive for malaise/fatigue. Negative for fever.  HENT: Negative for ear discharge and nosebleeds.   Eyes: Negative for photophobia and pain.  Respiratory: Negative for cough and shortness of breath.   Cardiovascular: Negative for chest pain, palpitations and leg swelling.  Gastrointestinal: Positive for abdominal pain. Negative for  blood in stool, nausea and vomiting.  Genitourinary: Negative for dysuria and hematuria.  Musculoskeletal: Negative for joint pain.  Skin: Negative for itching and rash.  Neurological: Negative for loss of consciousness and headaches.  Psychiatric/Behavioral: Positive for suicidal ideas. Negative for substance abuse.    Past Medical History:  Diagnosis Date  . Depression   . Miscarriage   . Morbid obesity (HCC)     Past Surgical History:  Procedure Laterality Date  . HERNIA REPAIR    . Repair of displaced fracture of the left radius  03/2020     reports that she has never smoked. She has never used smokeless tobacco. She reports that she does not drink alcohol and does not use drugs.  Allergies  Allergen Reactions  . Aripiprazole Rash  . Sulfa Antibiotics Rash    Family History  Problem Relation Age of Onset  . Diabetes Mother   . Hypertension Mother   . Depression Mother   . Hypertension Father     Prior to Admission medications   Not on File    Physical Exam:  Constitutional: Young obese female in NAD, calm, comfortable Vitals:   05/21/20 0545 05/21/20 0615 05/21/20 0630 05/21/20 0645  BP: 139/86 138/72 (!) 142/70 (!) 148/99  Pulse: 79 69 71 71  Resp: (!) 21 (!) 23 (!) 22 (!) 23  Temp:      TempSrc:      SpO2: 96% 97% 95% 97%  Weight:      Height:       Eyes: PERRL, lids and conjunctivae normal ENMT: Mucous membranes are moist. Posterior pharynx clear of any exudate or lesions.  Neck: normal, supple, no masses, no thyromegaly Respiratory:  clear to auscultation bilaterally, no wheezing, no crackles. Normal respiratory effort. No accessory muscle use.  Cardiovascular: Regular rate and rhythm, no murmurs / rubs / gallops. No extremity edema. 2+ pedal pulses. No carotid bruits.  Abdomen: no tenderness, no masses palpated. No hepatosplenomegaly. Bowel sounds positive.  Musculoskeletal: no clubbing / cyanosis. No joint deformity upper and lower extremities.  Good ROM, no contractures. Normal muscle tone.  Skin: no rashes, lesions, ulcers. No induration Neurologic: CN 2-12 grossly intact. Sensation intact, DTR normal. Strength 5/5 in all 4.   Psychiatric: Normal judgment and insight. Alert and oriented x 3. Depressed mood.     Labs on Admission: I have personally reviewed following labs and imaging studies  CBC: Recent Labs  Lab 05/21/20 0331  WBC 11.9*  NEUTROABS 7.5  HGB 14.2  HCT 42.5  MCV 89.9  PLT 369   Basic Metabolic Panel: Recent Labs  Lab 05/21/20 0331  NA 139  K 4.6  CL 106  CO2 25  GLUCOSE 120*  BUN 9  CREATININE 0.87  CALCIUM 9.0   GFR: Estimated Creatinine Clearance: 114.6 mL/min (by C-G formula based on SCr of 0.87 mg/dL). Liver Function Tests: Recent Labs  Lab 05/21/20 0331  AST 31  ALT 26  ALKPHOS 59  BILITOT 0.4  PROT 6.7  ALBUMIN 3.4*   No results for input(s): LIPASE, AMYLASE in the last 168 hours. No results for input(s): AMMONIA in the last 168 hours. Coagulation Profile: No results for input(s): INR, PROTIME in the last 168 hours. Cardiac Enzymes: No results for input(s): CKTOTAL, CKMB, CKMBINDEX, TROPONINI in the last 168 hours. BNP (last 3 results) No results for input(s): PROBNP in the last 8760 hours. HbA1C: No results for input(s): HGBA1C in the last 72 hours. CBG: No results for input(s): GLUCAP in the last 168 hours. Lipid Profile: No results for input(s): CHOL, HDL, LDLCALC, TRIG, CHOLHDL, LDLDIRECT in the last 72 hours. Thyroid Function Tests: No results for input(s): TSH, T4TOTAL, FREET4, T3FREE, THYROIDAB in the last 72 hours. Anemia Panel: No results for input(s): VITAMINB12, FOLATE, FERRITIN, TIBC, IRON, RETICCTPCT in the last 72 hours. Urine analysis:    Component Value Date/Time   COLORURINE YELLOW 05/12/2017 1940   APPEARANCEUR CLEAR 05/12/2017 1940   LABSPEC 1.016 05/12/2017 1940   PHURINE 5.0 05/12/2017 1940   GLUCOSEU NEGATIVE 05/12/2017 1940   HGBUR SMALL (A)  05/12/2017 1940   BILIRUBINUR NEGATIVE 05/12/2017 1940   KETONESUR NEGATIVE 05/12/2017 1940   PROTEINUR NEGATIVE 05/12/2017 1940   UROBILINOGEN 1.0 03/12/2015 2043   NITRITE NEGATIVE 05/12/2017 1940   LEUKOCYTESUR NEGATIVE 05/12/2017 1940   Sepsis Labs: Recent Results (from the past 240 hour(s))  Respiratory Panel by RT PCR (Flu A&B, Covid) - Nasopharyngeal Swab     Status: None   Collection Time: 05/21/20  3:31 AM   Specimen: Nasopharyngeal Swab; Nasopharyngeal(NP) swabs in vial transport medium  Result Value Ref Range Status   SARS Coronavirus 2 by RT PCR NEGATIVE NEGATIVE Final    Comment: (NOTE) SARS-CoV-2 target nucleic acids are NOT DETECTED.  The SARS-CoV-2 RNA is generally detectable in upper respiratoy specimens during the acute phase of infection. The lowest concentration of SARS-CoV-2 viral copies this assay can detect is 131 copies/mL. A negative result does not preclude SARS-Cov-2 infection and should not be used as the sole basis for treatment or other patient management decisions. A negative result may occur with  improper specimen collection/handling, submission of specimen other than nasopharyngeal swab, presence of viral mutation(s) within  the areas targeted by this assay, and inadequate number of viral copies (<131 copies/mL). A negative result must be combined with clinical observations, patient history, and epidemiological information. The expected result is Negative.  Fact Sheet for Patients:  https://www.moore.com/  Fact Sheet for Healthcare Providers:  https://www.young.biz/  This test is no t yet approved or cleared by the [place] FDA and  has been authorized for detection and/or diagnosis of SARS-CoV-2 by FDA under an Emergency Use Authorization (EUA). This EUA will remain  in effect (meaning this test can be used) for the duration of the COVID-19 declaration under Section 564(b)(1) of the Act, 21  U.S.C. section 360bbb-3(b)(1), unless the authorization is terminated or revoked sooner.     Influenza A by PCR NEGATIVE NEGATIVE Final   Influenza B by PCR NEGATIVE NEGATIVE Final    Comment: (NOTE) The Xpert Xpress SARS-CoV-2/FLU/RSV assay is intended as an aid in  the diagnosis of influenza from Nasopharyngeal swab specimens and  should not be used as a sole basis for treatment. Nasal washings and  aspirates are unacceptable for Xpert Xpress SARS-CoV-2/FLU/RSV  testing.  Fact Sheet for Patients: https://www.moore.com/  Fact Sheet for Healthcare Providers: https://www.young.biz/  This test is not yet approved or cleared by the [place] FDA and  has been authorized for detection and/or diagnosis of SARS-CoV-2 by  FDA under an Emergency Use Authorization (EUA). This EUA will remain  in effect (meaning this test can be used) for the duration of the  Covid-19 declaration under Section 564(b)(1) of the Act, 21  U.S.C. section 360bbb-3(b)(1), unless the authorization is  terminated or revoked. Performed at [facility], [address] [place], [place] [address]      Radiological Exams on Admission: No results found.  EKG: Independently reviewed.  Sinus rhythm at 92 bpm  Assessment/Plan Tylenol overdose, Intentional self harm: Patient reports being very depressed from a lot of social stressors here recently and attempted to take her life by taking up to 30 pills of 500 mg Tylenol along with a bottle of NyQuil.  She notes that she internalizes her stresser -Admit to a medical telemetry bed -Suicide precautions with sitter to bedside -add-on aPTTand PT/INR -Serial to monitor LFTs, coags, and Tylenol levels -Acetylcysteine po per pharmacy  -Will need to involuntarily commit patient if attempts to leave -Follow-up with poison control   Leukocytosis: Acute.  WBC elevated at 11.9 on admission.  Unclear if this is related with  patient's acute ingestion.  She denies any nausea, vomiting, dysuria, or cough symptoms. -Recheck CBC in a.m.  Depression: Patient currently not on any antidepressant medications, but notes family history is significant for mother also having depression. -Once medically stable will need formal consult to evaluate psychiatry  Morbid obesity: BMI 40.73 kg/m -Will need counseling on weight loss further in outpatient setting  DVT prophylaxis: Lovenox Code Status: Full Family Communication:  Husband updated over the phone Disposition Plan: To be determined Consults called: none  Admission status: observation. Suspect patient will likely only need 1 midnight stay if lab work remained stable otherwise may need to be switched to inpatient status  Clydie Braun MD [facility] Pager [phone]   If 7PM-7AM, please contact night-coverage [email or web address] Password [facility]  05/21/2020, 7:22 AM

## 2020-05-21 NOTE — ED Notes (Signed)
Sister Judeth Cornfield called and wanted an update. Pt was okay with this Clinical research associate giving updates to only the sister and husband.

## 2020-05-21 NOTE — Progress Notes (Signed)
Patient cell phone and clothing sent home with patient husband per patient request.

## 2020-05-21 NOTE — Progress Notes (Addendum)
MEDICATION RELATED CONSULT NOTE  Pharmacy Consult for PO Acetylcysteine  Indication: APAP overdose  Allergies  Allergen Reactions  . Aripiprazole Rash  . Sulfa Antibiotics Rash    Patient Measurements: Height: 5\' 2"  (157.5 cm) Weight: 113.4 kg (250 lb) IBW/kg (Calculated) : 50.1 Vital Signs: Temp: 97.9 F (36.6 C) (11/21 0448) Temp Source: Oral (11/21 0448) BP: 138/72 (11/21 0615) Pulse Rate: 69 (11/21 0615) Intake/Output from previous day: No intake/output data recorded. Intake/Output from this shift: No intake/output data recorded.  Labs: Recent Labs    05/21/20 0331  WBC 11.9*  HGB 14.2  HCT 42.5  PLT 369  CREATININE 0.87  ALBUMIN 3.4*  PROT 6.7  AST 31  ALT 26  ALKPHOS 59  BILITOT 0.4   Estimated Creatinine Clearance: 114.6 mL/min (by C-G formula based on SCr of 0.87 mg/dL).   Microbiology: Recent Results (from the past 720 hour(s))  Respiratory Panel by RT PCR (Flu A&B, Covid) - Nasopharyngeal Swab     Status: None   Collection Time: 05/21/20  3:31 AM   Specimen: Nasopharyngeal Swab; Nasopharyngeal(NP) swabs in vial transport medium  Result Value Ref Range Status   SARS Coronavirus 2 by RT PCR NEGATIVE NEGATIVE Final    Comment: (NOTE) SARS-CoV-2 target nucleic acids are NOT DETECTED.  The SARS-CoV-2 RNA is generally detectable in upper respiratoy specimens during the acute phase of infection. The lowest concentration of SARS-CoV-2 viral copies this assay can detect is 131 copies/mL. A negative result does not preclude SARS-Cov-2 infection and should not be used as the sole basis for treatment or other patient management decisions. A negative result may occur with  improper specimen collection/handling, submission of specimen other than nasopharyngeal swab, presence of viral mutation(s) within the areas targeted by this assay, and inadequate number of viral copies (<131 copies/mL). A negative result must be combined with clinical observations,  patient history, and epidemiological information. The expected result is Negative.  Fact Sheet for Patients:  05/23/20  Fact Sheet for Healthcare Providers:  https://www.moore.com/  This test is no t yet approved or cleared by the https://www.young.biz/ FDA and  has been authorized for detection and/or diagnosis of SARS-CoV-2 by FDA under an Emergency Use Authorization (EUA). This EUA will remain  in effect (meaning this test can be used) for the duration of the COVID-19 declaration under Section 564(b)(1) of the Act, 21 U.S.C. section 360bbb-3(b)(1), unless the authorization is terminated or revoked sooner.     Influenza A by PCR NEGATIVE NEGATIVE Final   Influenza B by PCR NEGATIVE NEGATIVE Final    Comment: (NOTE) The Xpert Xpress SARS-CoV-2/FLU/RSV assay is intended as an aid in  the diagnosis of influenza from Nasopharyngeal swab specimens and  should not be used as a sole basis for treatment. Nasal washings and  aspirates are unacceptable for Xpert Xpress SARS-CoV-2/FLU/RSV  testing.  Fact Sheet for Patients: [place]  Fact Sheet for Healthcare Providers: https://www.moore.com/  This test is not yet approved or cleared by the https://www.young.biz/ FDA and  has been authorized for detection and/or diagnosis of SARS-CoV-2 by  FDA under an Emergency Use Authorization (EUA). This EUA will remain  in effect (meaning this test can be used) for the duration of the  Covid-19 declaration under Section 564(b)(1) of the Act, 21  U.S.C. section 360bbb-3(b)(1), unless the authorization is  terminated or revoked. Performed at [facility], [address] [place], [place]     Assessment: -APAP overdose (#30-500 mg APAP and Nyquil  bottle) -APAP level is trending up (63>>150), LFT's ok for now   Plan:  PO Acetylcysteine 140 mg/kg x 1, followed by 70 mg/kg q4h F/U LFT's,  INR, aPTT, APAP level F/U with poison control regarding length of treatment Switch to IV if patient unable to tolerate PO  Abran Duke, PharmD, BCPS Clinical Pharmacist Phone: [phone]

## 2020-05-21 NOTE — ED Notes (Signed)
Lunch tray ordered at 1010 -af

## 2020-05-21 NOTE — ED Notes (Signed)
Pharmacy & Katrinka Blazing MD has been updated about Poison Control recommendations.

## 2020-05-21 NOTE — ED Triage Notes (Signed)
Patient arrives via ems after attempting suicide by taking 30-500mg  tylenol and drinking a bottle of nyquil between 0000 - 0100. Patient alert and oriented.

## 2020-05-21 NOTE — ED Notes (Signed)
Assuming care of patient at this time. Pt currently does not have a sitter. Will notify staffing office for sitter. Pt requesting to use the bathroom. Pt ambulatory with steady gait. Resp even and unlabored.

## 2020-05-21 NOTE — ED Notes (Signed)
Spoke with poison control. Pt vital signs are stable and she is not symptomatic at this time. States they will follow up again later.

## 2020-05-22 DIAGNOSIS — T50901A Poisoning by unspecified drugs, medicaments and biological substances, accidental (unintentional), initial encounter: Secondary | ICD-10-CM

## 2020-05-22 HISTORY — DX: Poisoning by unspecified drugs, medicaments and biological substances, accidental (unintentional), initial encounter: T50.901A

## 2020-05-22 LAB — COMPREHENSIVE METABOLIC PANEL
ALT: 22 U/L (ref 0–44)
AST: 17 U/L (ref 15–41)
Albumin: 3.3 g/dL — ABNORMAL LOW (ref 3.5–5.0)
Alkaline Phosphatase: 53 U/L (ref 38–126)
Anion gap: 13 (ref 5–15)
BUN: 9 mg/dL (ref 6–20)
CO2: 19 mmol/L — ABNORMAL LOW (ref 22–32)
Calcium: 8.9 mg/dL (ref 8.9–10.3)
Chloride: 107 mmol/L (ref 98–111)
Creatinine, Ser: 0.69 mg/dL (ref 0.44–1.00)
GFR, Estimated: >60 mL/min (ref >60)
Glucose, Bld: 109 mg/dL — ABNORMAL HIGH (ref 70–99)
Potassium: 3.9 mmol/L (ref 3.5–5.1)
Sodium: 139 mmol/L (ref 135–145)
Total Bilirubin: 0.4 mg/dL (ref 0.3–1.2)
Total Protein: 7.2 g/dL (ref 6.5–8.1)

## 2020-05-22 LAB — CBC
HCT: 41.4 % (ref 36.0–46.0)
Hemoglobin: 14.3 g/dL (ref 12.0–15.0)
MCH: 30.7 pg (ref 26.0–34.0)
MCHC: 34.5 g/dL (ref 30.0–36.0)
MCV: 88.8 fL (ref 80.0–100.0)
Platelets: 332 10*3/uL (ref 150–400)
RBC: 4.66 MIL/uL (ref 3.87–5.11)
RDW: 12.5 % (ref 11.5–15.5)
WBC: 10.9 10*3/uL — ABNORMAL HIGH (ref 4.0–10.5)
nRBC: 0 % (ref 0.0–0.2)

## 2020-05-22 LAB — PROTIME-INR
INR: 1.1 (ref 0.8–1.2)
Prothrombin Time: 13.7 seconds (ref 11.4–15.2)

## 2020-05-22 LAB — ACETAMINOPHEN LEVEL: Acetaminophen (Tylenol), Serum: <10 ug/mL — ABNORMAL LOW (ref 10–30)

## 2020-05-22 LAB — APTT: aPTT: 25 seconds (ref 24–36)

## 2020-05-22 MED ORDER — SODIUM CHLORIDE 0.9 % IV SOLN: INTRAVENOUS | Status: DC

## 2020-05-22 NOTE — Plan of Care (Signed)
  Problem: Education: Goal: Knowledge of warning signs, risks, and behaviors that relate to suicide ideation and self-harm behaviors will improve Outcome: Progressing   Problem: Coping: Goal: Level of anxiety will decrease Outcome: Progressing   Problem: Safety: Goal: Ability to remain free from injury will improve Outcome: Progressing

## 2020-05-22 NOTE — Consult Note (Signed)
[facility] Face-to-Face Psychiatry Consult   Reason for Consult:  Suicide Attempt Referring Physician:  Dr. Marylu Lund Patient Identification: Kristen Padilla MRN:  [id number] Principal Diagnosis: Tylenol overdose, intentional self-harm, initial encounter [facility]) Diagnosis:  Principal Problem:   Tylenol overdose, intentional self-harm, initial encounter [facility]) Active Problems:   Morbid obesity (HCC)   Depression   Leukocytosis   Overdose   Total Time spent with patient: 30 minutes  Subjective:   Kristen Padilla is a 29 y.o. female patient admitted with intentional overdose. She has history of significant depression and trauma, some of which she chooses not to disclose prior to asking her husband to step out. She rpoerts a history of uncontrolled depression that has been ongoing since childhood. SHe reports no identifiying triggers with the exception of her husband had stayed out 1-2 hours and she was worried about him. SHe then begun to ruminate about her postpartum depression, child given up for adoption, mother leaving her, and husband being caregiver. She reports taking about 20-30 Tylenol and drinking 1 whole bottle of Nyquil. She denies any previous suicide attempts. She denies any suicidal intent and does appear to show remorse at this time for her actions.   HPI: Kristen Padilla a 29 y.o.femalewith medical history significant ofdepression and morbid obesity presents after trying to harm herself by taking anywhere from 20-30500 mg tabletsof Tylenol between 12-1 AM this morning and a bottle of NyQuil. Patient notes that she has had a lot of stress and been very depressed lately.  acetaminophen level 63->150, and salicylate level less than 7.UDS was negative.Poison control hadbeen contacted and it was recommended to initiate Mucomyst due to the rising acetaminophen levels.  Past Psychiatric History: Depression and PTSD  Risk to Self:  Denies Risk to  Others:  Denies Prior Inpatient Therapy:   Denies Prior Outpatient Therapy:  Denies  Past Medical History:  Past Medical History:  Diagnosis Date  . Depression   . Miscarriage   . Morbid obesity (HCC)     Past Surgical History:  Procedure Laterality Date  . HERNIA REPAIR    . Repair of displaced fracture of the left radius  03/2020   Family History:  Family History  Problem Relation Age of Onset  . Diabetes Mother   . Hypertension Mother   . Depression Mother   . Hypertension Father    Family Psychiatric  History: Mother and sister both have depression and anxiety.   Social History:  Social History   Substance and Sexual Activity  Alcohol Use No     Social History   Substance and Sexual Activity  Drug Use No    Social History   Socioeconomic History  . Marital status: Married    Spouse name: Not on file  . Number of children: Not on file  . Years of education: Not on file  . Highest education level: Not on file  Occupational History  . Not on file  Tobacco Use  . Smoking status: Never Smoker  . Smokeless tobacco: Never Used  Substance and Sexual Activity  . Alcohol use: No  . Drug use: No  . Sexual activity: Yes    Birth control/protection: None  Other Topics Concern  . Not on file  Social History Narrative  . Not on file   Social Determinants of Health   Financial Resource Strain:   . Difficulty of Paying Living Expenses: Not on file  Food Insecurity:   . Worried About Programme researcher, broadcasting/film/video in the  Last Year: Not on file  . Ran Out of Food in the Last Year: Not on file  Transportation Needs:   . Lack of Transportation (Medical): Not on file  . Lack of Transportation (Non-Medical): Not on file  Physical Activity:   . Days of Exercise per Week: Not on file  . Minutes of Exercise per Session: Not on file  Stress:   . Feeling of Stress : Not on file  Social Connections:   . Frequency of Communication with Friends and Family: Not on file  .  Frequency of Social Gatherings with Friends and Family: Not on file  . Attends Religious Services: Not on file  . Active Member of Clubs or Organizations: Not on file  . Attends Banker Meetings: Not on file  . Marital Status: Not on file   Additional Social History:    Allergies:   Allergies  Allergen Reactions  . Aripiprazole Rash  . Sulfa Antibiotics Rash    Labs:  Results for orders placed or performed during the hospital encounter of 05/21/20 (from the past 48 hour(s))  Urine rapid drug screen (hosp performed)     Status: None   Collection Time: 05/21/20  2:30 AM  Result Value Ref Range   Opiates NONE DETECTED NONE DETECTED   Cocaine NONE DETECTED NONE DETECTED   Benzodiazepines NONE DETECTED NONE DETECTED   Amphetamines NONE DETECTED NONE DETECTED   Tetrahydrocannabinol NONE DETECTED NONE DETECTED   Barbiturates NONE DETECTED NONE DETECTED    Comment: (NOTE) DRUG SCREEN FOR MEDICAL PURPOSES ONLY.  IF CONFIRMATION IS NEEDED FOR ANY PURPOSE, NOTIFY LAB WITHIN 5 DAYS.  LOWEST DETECTABLE LIMITS FOR URINE DRUG SCREEN Drug Class                     Cutoff (ng/mL) Amphetamine and metabolites    1000 Barbiturate and metabolites    200 Benzodiazepine                 200 Tricyclics and metabolites     300 Opiates and metabolites        300 Cocaine and metabolites        300 THC                            50 Performed at [facility], [address] [place], [place] [address]   Respiratory Panel by RT PCR (Flu A&B, Covid) - Nasopharyngeal Swab     Status: None   Collection Time: 05/21/20  3:31 AM   Specimen: Nasopharyngeal Swab; Nasopharyngeal(NP) swabs in vial transport medium  Result Value Ref Range   SARS Coronavirus 2 by RT PCR NEGATIVE NEGATIVE    Comment: (NOTE) SARS-CoV-2 target nucleic acids are NOT DETECTED.  The SARS-CoV-2 RNA is generally detectable in upper respiratoy specimens during the acute phase of infection. The  lowest concentration of SARS-CoV-2 viral copies this assay can detect is 131 copies/mL. A negative result does not preclude SARS-Cov-2 infection and should not be used as the sole basis for treatment or other patient management decisions. A negative result may occur with  improper specimen collection/handling, submission of specimen other than nasopharyngeal swab, presence of viral mutation(s) within the areas targeted by this assay, and inadequate number of viral copies (<131 copies/mL). A negative result must be combined with clinical observations, patient history, and epidemiological information. The expected result is Negative.  Fact Sheet for Patients:  https://www.moore.com/  Fact Sheet for Healthcare Providers:  https://www.young.biz/  This test is no t yet approved or cleared by the [place] FDA and  has been authorized for detection and/or diagnosis of SARS-CoV-2 by FDA under an Emergency Use Authorization (EUA). This EUA will remain  in effect (meaning this test can be used) for the duration of the COVID-19 declaration under Section 564(b)(1) of the Act, 21 U.S.C. section 360bbb-3(b)(1), unless the authorization is terminated or revoked sooner.     Influenza A by PCR NEGATIVE NEGATIVE   Influenza B by PCR NEGATIVE NEGATIVE    Comment: (NOTE) The Xpert Xpress SARS-CoV-2/FLU/RSV assay is intended as an aid in  the diagnosis of influenza from Nasopharyngeal swab specimens and  should not be used as a sole basis for treatment. Nasal washings and  aspirates are unacceptable for Xpert Xpress SARS-CoV-2/FLU/RSV  testing.  Fact Sheet for Patients: https://www.moore.com/  Fact Sheet for Healthcare Providers: https://www.young.biz/  This test is not yet approved or cleared by the [place] FDA and  has been authorized for detection and/or diagnosis of SARS-CoV-2 by  FDA under an Emergency  Use Authorization (EUA). This EUA will remain  in effect (meaning this test can be used) for the duration of the  Covid-19 declaration under Section 564(b)(1) of the Act, 21  U.S.C. section 360bbb-3(b)(1), unless the authorization is  terminated or revoked. Performed at [facility], [address], [place], [place] [address]   Comprehensive metabolic panel     Status: Abnormal   Collection Time: 05/21/20  3:31 AM  Result Value Ref Range   Sodium 139 135 - 145 mmol/L   Potassium 4.6 3.5 - 5.1 mmol/L   Chloride 106 98 - 111 mmol/L   CO2 25 22 - 32 mmol/L   Glucose, Bld 120 (H) 70 - 99 mg/dL    Comment: Glucose reference range applies only to samples taken after fasting for at least 8 hours.   BUN 9 6 - 20 mg/dL   Creatinine, Ser 1.91 0.44 - 1.00 mg/dL   Calcium 9.0 8.9 - 47.8 mg/dL   Total Protein 6.7 6.5 - 8.1 g/dL   Albumin 3.4 (L) 3.5 - 5.0 g/dL   AST 31 15 - 41 U/L   ALT 26 0 - 44 U/L   Alkaline Phosphatase 59 38 - 126 U/L   Total Bilirubin 0.4 0.3 - 1.2 mg/dL   GFR, Estimated >29 >56 mL/min    Comment: (NOTE) Calculated using the CKD-EPI Creatinine Equation (2021)    Anion gap 8 5 - 15    Comment: Performed at [facility], [address], [place], [place] [address]  Ethanol     Status: None   Collection Time: 05/21/20  3:31 AM  Result Value Ref Range   Alcohol, Ethyl (B) <10 <10 mg/dL    Comment: (NOTE) Lowest detectable limit for serum alcohol is 10 mg/dL.  For medical purposes only. Performed at [facility], [address], [place], [place] [address]   CBC with Diff     Status: Abnormal   Collection Time: 05/21/20  3:31 AM  Result Value Ref Range   WBC 11.9 (H) 4.0 - 10.5 K/uL   RBC 4.73 3.87 - 5.11 MIL/uL   Hemoglobin 14.2 12.0 - 15.0 g/dL   HCT 69.6 36 - 46 %   MCV 89.9 80.0 - 100.0 fL   MCH 30.0 26.0 - 34.0 pg   MCHC 33.4 30.0 - 36.0 g/dL   RDW  12.2 11.5 - 15.5 %   Platelets 369 150 - 400 K/uL   nRBC 0.0 0.0 - 0.2 %   Neutrophils  Relative % 62 %   Neutro Abs 7.5 1.7 - 7.7 K/uL   Lymphocytes Relative 28 %   Lymphs Abs 3.3 0.7 - 4.0 K/uL   Monocytes Relative 6 %   Monocytes Absolute 0.7 0.1 - 1.0 K/uL   Eosinophils Relative 2 %   Eosinophils Absolute 0.2 0.0 - 0.5 K/uL   Basophils Relative 1 %   Basophils Absolute 0.1 0.0 - 0.1 K/uL   Immature Granulocytes 1 %   Abs Immature Granulocytes 0.07 0.00 - 0.07 K/uL    Comment: Performed at [facility], [address] [place], [place] [address]  Acetaminophen level     Status: Abnormal   Collection Time: 05/21/20  3:31 AM  Result Value Ref Range   Acetaminophen (Tylenol), Serum 63 (H) 10 - 30 ug/mL    Comment: (NOTE) Therapeutic concentrations vary significantly. A range of 10-30 ug/mL  may be an effective concentration for many patients. However, some  are best treated at concentrations outside of this range. Acetaminophen concentrations >150 ug/mL at 4 hours after ingestion  and >50 ug/mL at 12 hours after ingestion are often associated with  toxic reactions.  Performed at [facility], [address] [place], [place] [address]   Salicylate level     Status: Abnormal   Collection Time: 05/21/20  3:31 AM  Result Value Ref Range   Salicylate Lvl <7.0 (L) 7.0 - 30.0 mg/dL    Comment: Performed at [facility], [address] [place], [place] [address]  I-Stat beta hCG blood, ED     Status: None   Collection Time: 05/21/20  4:19 AM  Result Value Ref Range   I-stat hCG, quantitative <5.0 <5 mIU/mL   Comment 3            Comment:   GEST. AGE      CONC.  (mIU/mL)   <=1 WEEK        5 - 50     2 WEEKS       50 - 500     3 WEEKS       100 - 10,000     4 WEEKS     1,000 - 30,000        FEMALE AND NON-PREGNANT FEMALE:     LESS THAN 5 mIU/mL   Acetaminophen level     Status: Abnormal   Collection Time: 05/21/20  5:49 AM  Result Value Ref Range   Acetaminophen (Tylenol), Serum 150 (H) 10 - 30 ug/mL    Comment: (NOTE) Therapeutic  concentrations vary significantly. A range of 10-30 ug/mL  may be an effective concentration for many patients. However, some  are best treated at concentrations outside of this range. Acetaminophen concentrations >150 ug/mL at 4 hours after ingestion  and >50 ug/mL at 12 hours after ingestion are often associated with  toxic reactions.  Performed at [facility], [address] [place], [place] [address]   HIV Antibody (routine testing w rflx)     Status: None   Collection Time: 05/21/20 12:37 PM  Result Value Ref Range   HIV Screen 4th Generation wRfx Non Reactive Non Reactive    Comment: Performed at [facility], [address] [place], [place] [address]  APTT     Status: None   Collection Time:  05/21/20 12:37 PM  Result Value Ref Range   aPTT 26 24 - 36 seconds    Comment: Performed at [facility], [address], [place], [place] [address]  Protime-INR     Status: None   Collection Time: 05/21/20 12:37 PM  Result Value Ref Range   Prothrombin Time 13.6 11.4 - 15.2 seconds   INR 1.1 0.8 - 1.2    Comment: (NOTE) INR goal varies based on device and disease states. Performed at [facility], [address] [place], [place] [address]   Acetaminophen level     Status: Abnormal   Collection Time: 05/22/20  3:26 AM  Result Value Ref Range   Acetaminophen (Tylenol), Serum <10 (L) 10 - 30 ug/mL    Comment: (NOTE) Therapeutic concentrations vary significantly. A range of 10-30 ug/mL  may be an effective concentration for many patients. However, some  are best treated at concentrations outside of this range. Acetaminophen concentrations >150 ug/mL at 4 hours after ingestion  and >50 ug/mL at 12 hours after ingestion are often associated with  toxic reactions.  Performed at [facility], [address], [place], [place] [address]   Comprehensive metabolic panel     Status: Abnormal   Collection Time: 05/22/20  3:26 AM  Result Value Ref Range    Sodium 139 135 - 145 mmol/L   Potassium 3.9 3.5 - 5.1 mmol/L   Chloride 107 98 - 111 mmol/L   CO2 19 (L) 22 - 32 mmol/L   Glucose, Bld 109 (H) 70 - 99 mg/dL    Comment: Glucose reference range applies only to samples taken after fasting for at least 8 hours.   BUN 9 6 - 20 mg/dL   Creatinine, Ser 9.14 0.44 - 1.00 mg/dL   Calcium 8.9 8.9 - 78.2 mg/dL   Total Protein 7.2 6.5 - 8.1 g/dL   Albumin 3.3 (L) 3.5 - 5.0 g/dL   AST 17 15 - 41 U/L   ALT 22 0 - 44 U/L   Alkaline Phosphatase 53 38 - 126 U/L   Total Bilirubin 0.4 0.3 - 1.2 mg/dL   GFR, Estimated >95 >62 mL/min    Comment: (NOTE) Calculated using the CKD-EPI Creatinine Equation (2021)    Anion gap 13 5 - 15    Comment: Performed at [facility], [address] [place], [place] [address]  Protime-INR     Status: None   Collection Time: 05/22/20  3:26 AM  Result Value Ref Range   Prothrombin Time 13.7 11.4 - 15.2 seconds   INR 1.1 0.8 - 1.2    Comment: (NOTE) INR goal varies based on device and disease states. Performed at [facility], [address] [place], [place] [address]   CBC     Status: Abnormal   Collection Time: 05/22/20  3:26 AM  Result Value Ref Range   WBC 10.9 (H) 4.0 - 10.5 K/uL   RBC 4.66 3.87 - 5.11 MIL/uL   Hemoglobin 14.3 12.0 - 15.0 g/dL   HCT 96.2 36 - 46 %   MCV 88.8 80.0 - 100.0 fL   MCH 30.7 26.0 - 34.0 pg   MCHC 34.5 30.0 - 36.0 g/dL   RDW 95.2 84.1 - 32.4 %   Platelets 332 150 - 400 K/uL   nRBC 0.0 0.0 - 0.2 %    Comment: Performed at [facility], [address] [place], [place] [address]  APTT  Status: None   Collection Time: 05/22/20  3:26 AM  Result Value Ref Range   aPTT 25 24 - 36 seconds    Comment: Performed at [facility], [address], [place], [place] [address]    Current Facility-Administered Medications  Medication Dose Route Frequency Provider Last Rate Last Admin  . 0.9 %  sodium chloride infusion   Intravenous Continuous Lynn Ito,  MD 75 mL/hr at 05/22/20 1723 New Bag at 05/22/20 1723  . enoxaparin (LOVENOX) injection 55 mg  55 mg Subcutaneous Daily Norva Pavlov, RPH   55 mg at 05/22/20 1000  . metoprolol tartrate (LOPRESSOR) injection 5 mg  5 mg Intravenous Q6H PRN Katrinka Blazing, Rondell A, MD      . ondansetron (ZOFRAN) tablet 4 mg  4 mg Oral Q6H PRN Madelyn Flavors A, MD       Or  . ondansetron (ZOFRAN) injection 4 mg  4 mg Intravenous Q6H PRN Smith, Rondell A, MD      . sodium chloride flush (NS) 0.9 % injection 3 mL  3 mL Intravenous Q12H Smith, Rondell A, MD   3 mL at 05/22/20 1007    Musculoskeletal: Strength & Muscle Tone: within normal limits Gait & Station: normal Patient leans: N/A  Psychiatric Specialty Exam: Physical Exam  Review of Systems  Blood pressure 123/86, pulse 80, temperature 98.7 F (37.1 C), temperature source Oral, resp. rate 19, height 5\' 2"  (1.575 m), weight 113.4 kg, SpO2 98 %.Body mass index is 45.73 kg/m.  General Appearance: Fairly Groomed  Eye Contact:  Fair  Speech:  Clear and Coherent and Normal Rate  Volume:  Decreased  Mood:  Depressed  Affect:  Depressed  Thought Process:  Coherent, Linear and Descriptions of Associations: Intact  Orientation:  Full (Time, Place, and Person)  Thought Content:  Logical  Suicidal Thoughts:  Yes.  with intent/plan  Homicidal Thoughts:  No  Memory:  Immediate;   Fair Recent;   Fair Remote;   Fair  Judgement:  Poor  Insight:  Shallow  Psychomotor Activity:  Normal  Concentration:  Concentration: Fair and Attention Span: Fair  Recall:  of Knowledge:  Fair  Language:  Fair  Akathisia:  No  Handed:  Right  AIMS (if indicated):     Assets:  Communication Skills Desire for Improvement Financial Resources/Insurance Housing Leisure Time Physical Health Social Support  ADL's:  Intact  Cognition:  WNL  Sleep:        Treatment Plan Summary: Plan Recommend inpatient psych admission. Considering the lethality of her  attempt and no identifying triggers, she will need an inpatient admission. Recommend working cloely with social work for placement once she is medially cleared. ZIf she attempts or expresses interest in leaving, she will need to be placed under IVC. Will keep 1:1 safety sitter at this time.   Disposition: Recommend psychiatric Inpatient admission when medically cleared.  [employer], FNP 05/22/2020 6:09 PM

## 2020-05-22 NOTE — Plan of Care (Signed)
  Problem: Education: Goal: Knowledge of warning signs, risks, and behaviors that relate to suicide ideation and self-harm behaviors will improve Outcome: Progressing   Problem: Health Behavior/Discharge (Transition) Planning: Goal: Ability to manage health-related needs will improve Outcome: Progressing   Problem: Coping: Goal: Level of anxiety will decrease Outcome: Progressing   Problem: Safety: Goal: Ability to remain free from injury will improve Outcome: Progressing

## 2020-05-22 NOTE — Progress Notes (Signed)
PROGRESS NOTE    Kristen Padilla  ZOX:[id number] DOB: 1991-01-26 DOA: 05/21/2020 PCP: Patient, No Pcp Per    Brief Narrative:  Kristen Padilla is a 29 y.o. female with medical history significant of depression and morbid obesity presents after trying to harm herself by taking anywhere from 20-30 500 mg tablets of Tylenol between 12-1 AM this morning and a bottle of NyQuil.  Patient notes that she has had a lot of stress and been very depressed lately.  acetaminophen level 63->150, and salicylate level less than 7.  UDS was negative.Poison control had been contacted and it was recommended to initiate Mucomyst due to the rising acetaminophen levels.    11/22-1:1 sit.Tylenol level decreased to <10. Completed all 5 doses of mucomyst   Consultants:     Procedures:   Antimicrobials:       Subjective: Husband at bedside.  Patient about several episodes of diarrhea after giving Mucomyst.  Has no other complaints.  Objective: Vitals:   05/21/20 1142 05/21/20 2132 05/22/20 0526 05/22/20 1419  BP: 120/79 122/73 116/80 123/86  Pulse: 82 70 87 80  Resp: 17 16 20 19   Temp: 98.4 F (36.9 C) 98.8 F (37.1 C) 98.3 F (36.8 C) 98.7 F (37.1 C)  TempSrc: Oral Oral Oral Oral  SpO2: 98% 98% 95% 98%  Weight:      Height:        Intake/Output Summary (Last 24 hours) at 05/22/2020 1614 Last data filed at 05/22/2020 1300 Gross per 24 hour  Intake 1920 ml  Output --  Net 1920 ml   Filed Weights   05/21/20 0242  Weight: 113.4 kg    Examination:  General exam: Appears calm and comfortable  Respiratory system: Clear to auscultation. Respiratory effort normal. Cardiovascular system: S1 & S2 heard, RRR. No JVD, murmurs, rubs, gallops or clicks. Gastrointestinal system: Abdomen is nondistended, soft and nontender.  Normal bowel sounds heard. Central nervous system: Alert and oriented. No focal neurological deficits. Extremities: No edema Skin: Warm  dry Psychiatry:  Mood & affect appropriate in current setting.     Data Reviewed: I have personally reviewed following labs and imaging studies  CBC: Recent Labs  Lab 05/21/20 0331 05/22/20 0326  WBC 11.9* 10.9*  NEUTROABS 7.5  --   HGB 14.2 14.3  HCT 42.5 41.4  MCV 89.9 88.8  PLT 369 332   Basic Metabolic Panel: Recent Labs  Lab 05/21/20 0331 05/22/20 0326  NA 139 139  K 4.6 3.9  CL 106 107  CO2 25 19*  GLUCOSE 120* 109*  BUN 9 9  CREATININE 0.87 0.69  CALCIUM 9.0 8.9   GFR: Estimated Creatinine Clearance: 124.6 mL/min (by C-G formula based on SCr of 0.69 mg/dL). Liver Function Tests: Recent Labs  Lab 05/21/20 0331 05/22/20 0326  AST 31 17  ALT 26 22  ALKPHOS 59 53  BILITOT 0.4 0.4  PROT 6.7 7.2  ALBUMIN 3.4* 3.3*   No results for input(s): LIPASE, AMYLASE in the last 168 hours. No results for input(s): AMMONIA in the last 168 hours. Coagulation Profile: Recent Labs  Lab 05/21/20 1237 05/22/20 0326  INR 1.1 1.1   Cardiac Enzymes: No results for input(s): CKTOTAL, CKMB, CKMBINDEX, TROPONINI in the last 168 hours. BNP (last 3 results) No results for input(s): PROBNP in the last 8760 hours. HbA1C: No results for input(s): HGBA1C in the last 72 hours. CBG: No results for input(s): GLUCAP in the last 168 hours. Lipid Profile: No results for input(s):  CHOL, HDL, LDLCALC, TRIG, CHOLHDL, LDLDIRECT in the last 72 hours. Thyroid Function Tests: No results for input(s): TSH, T4TOTAL, FREET4, T3FREE, THYROIDAB in the last 72 hours. Anemia Panel: No results for input(s): VITAMINB12, FOLATE, FERRITIN, TIBC, IRON, RETICCTPCT in the last 72 hours. Sepsis Labs: No results for input(s): PROCALCITON, LATICACIDVEN in the last 168 hours.  Recent Results (from the past 240 hour(s))  Respiratory Panel by RT PCR (Flu A&B, Covid) - Nasopharyngeal Swab     Status: None   Collection Time: 05/21/20  3:31 AM   Specimen: Nasopharyngeal Swab; Nasopharyngeal(NP) swabs in  vial transport medium  Result Value Ref Range Status   SARS Coronavirus 2 by RT PCR NEGATIVE NEGATIVE Final    Comment: (NOTE) SARS-CoV-2 target nucleic acids are NOT DETECTED.  The SARS-CoV-2 RNA is generally detectable in upper respiratoy specimens during the acute phase of infection. The lowest concentration of SARS-CoV-2 viral copies this assay can detect is 131 copies/mL. A negative result does not preclude SARS-Cov-2 infection and should not be used as the sole basis for treatment or other patient management decisions. A negative result may occur with  improper specimen collection/handling, submission of specimen other than nasopharyngeal swab, presence of viral mutation(s) within the areas targeted by this assay, and inadequate number of viral copies (<131 copies/mL). A negative result must be combined with clinical observations, patient history, and epidemiological information. The expected result is Negative.  Fact Sheet for Patients:  https://www.moore.com/  Fact Sheet for Healthcare Providers:  https://www.young.biz/  This test is no t yet approved or cleared by the [place] FDA and  has been authorized for detection and/or diagnosis of SARS-CoV-2 by FDA under an Emergency Use Authorization (EUA). This EUA will remain  in effect (meaning this test can be used) for the duration of the COVID-19 declaration under Section 564(b)(1) of the Act, 21 U.S.C. section 360bbb-3(b)(1), unless the authorization is terminated or revoked sooner.     Influenza A by PCR NEGATIVE NEGATIVE Final   Influenza B by PCR NEGATIVE NEGATIVE Final    Comment: (NOTE) The Xpert Xpress SARS-CoV-2/FLU/RSV assay is intended as an aid in  the diagnosis of influenza from Nasopharyngeal swab specimens and  should not be used as a sole basis for treatment. Nasal washings and  aspirates are unacceptable for Xpert Xpress SARS-CoV-2/FLU/RSV  testing.  Fact  Sheet for Patients: https://www.moore.com/  Fact Sheet for Healthcare Providers: https://www.young.biz/  This test is not yet approved or cleared by the [place] FDA and  has been authorized for detection and/or diagnosis of SARS-CoV-2 by  FDA under an Emergency Use Authorization (EUA). This EUA will remain  in effect (meaning this test can be used) for the duration of the  Covid-19 declaration under Section 564(b)(1) of the Act, 21  U.S.C. section 360bbb-3(b)(1), unless the authorization is  terminated or revoked. Performed at [facility], [address], [place], [place] [address]          Radiology Studies: No results found.      Scheduled Meds: . enoxaparin (LOVENOX) injection  55 mg Subcutaneous Daily  . sodium chloride flush  3 mL Intravenous Q12H   Continuous Infusions:  Assessment & Plan:   Principal Problem:   Tylenol overdose, intentional self-harm, initial encounter [facility]) Active Problems:   Morbid obesity (HCC)   Depression   Leukocytosis   Overdose   Tylenol overdose, Intentional self harm:  Suicide precautions with sitter to bedside Consulted psychiatry for further evaluation and commit  Tylenol levels stable now We will need to involuntarily commit patient if attempts to leave Completed Mucomyst doses LFT stable Has had several diarrhea episodes from mucomyst, will start ivf for hydration    Leukocytosis: Acute.   Likely stress-induced.  Afebrile  Improving Continue to monitor    Depression: Patient currently not on any antidepressant medications Psychiatry is consulted for further evaluation-pending    Morbid obesity: BMI 40.73 kg/m Complicates overall medical issues Will need to follow a weight loss regimen as outpatient   DVT prophylaxis: Lovenox Code Status: Full Family Communication: Husband at bedside Disposition Plan:  Status is: Inpatient  Remains inpatient  appropriate because:Unsafe d/c plan,    Dispo: The patient is from: Home              Anticipated d/c is to: TBD              Anticipated d/c date is: 2 days              Patient currently is not medically stable to d/c.psychiatry consult pending            LOS: 0 days   Time spent: 35 minutes with more than 50% on COC   Lynn Ito, MD [facility] Pager [phone]  If 7PM-7AM, please contact night-coverage [email or web address] Password [facility] 05/22/2020, 4:14 PM

## 2020-05-23 LAB — BASIC METABOLIC PANEL WITH GFR
Anion gap: 13 (ref 5–15)
BUN: 8 mg/dL (ref 6–20)
CO2: 18 mmol/L — ABNORMAL LOW (ref 22–32)
Calcium: 9.2 mg/dL (ref 8.9–10.3)
Chloride: 106 mmol/L (ref 98–111)
Creatinine, Ser: 0.67 mg/dL (ref 0.44–1.00)
GFR, Estimated: >60 mL/min
Glucose, Bld: 81 mg/dL (ref 70–99)
Potassium: 4.1 mmol/L (ref 3.5–5.1)
Sodium: 137 mmol/L (ref 135–145)

## 2020-05-23 LAB — CBC
HCT: 43.6 % (ref 36.0–46.0)
Hemoglobin: 14.9 g/dL (ref 12.0–15.0)
MCH: 30.3 pg (ref 26.0–34.0)
MCHC: 34.2 g/dL (ref 30.0–36.0)
MCV: 88.6 fL (ref 80.0–100.0)
Platelets: 371 10*3/uL (ref 150–400)
RBC: 4.92 MIL/uL (ref 3.87–5.11)
RDW: 12.4 % (ref 11.5–15.5)
WBC: 11.1 10*3/uL — ABNORMAL HIGH (ref 4.0–10.5)
nRBC: 0 % (ref 0.0–0.2)

## 2020-05-23 NOTE — TOC Progression Note (Addendum)
Transition of Care [facility]) - Progression Note    Patient Details  Name: Kristen Padilla MRN: [id number] Date of Birth: August 27, 1990  Transition [facility] Care [facility]) CM/SW Contact  Okey Dupre Lazaro Arms, LCSW Phone Number: 05/23/2020, 1:51 PM  Clinical Narrative:  Patient evaluated by psychiatry and inpatient psychiatric placement recommended. Contact made with the following facilities 1. [facility] - Secure chat to [place] [facility]  2. [facility] - no beds at this time 3. [facility] [phone]) No beds at this time. Can try back tomorrow morning around 10 am 4. [facility] - [phone]. E-faxed clinicals [phone]) 5. [facility] [phone]) No bed availability at this time  [facility] team will continue providing SW intervention services as needed, that includes the search for  psychiatric placement for patient.          Expected Discharge Plan and Services - Inpatient psychiatric facility           Expected Discharge Date: 05/23/20                Patient will discharge once psychiatric placement found.                     Social Determinants of Health (SDOH) Interventions  Patient in need of psychiatric treatment services  Readmission Risk Interventions No flowsheet data found.

## 2020-05-23 NOTE — Plan of Care (Signed)
  Problem: Education: Goal: Knowledge of warning signs, risks, and behaviors that relate to suicide ideation and self-harm behaviors will improve 05/23/2020 1124 by Katrina Stack, RN Outcome: Adequate for Discharge 05/23/2020 0749 by Katrina Stack, RN Outcome: Progressing   Problem: Health Behavior/Discharge (Transition) Planning: Goal: Ability to manage health-related needs will improve 05/23/2020 1124 by Katrina Stack, RN Outcome: Adequate for Discharge 05/23/2020 0749 by Katrina Stack, RN Outcome: Progressing   Problem: Clinical Measurements: Goal: Remain free from any harm during hospitalization Outcome: Adequate for Discharge   Problem: Nutrition: Goal: Adequate fluids and nutrition will be maintained Outcome: Adequate for Discharge   Problem: Coping: Goal: Ability to disclose and discuss thoughts of suicide and self-harm will improve 05/23/2020 1124 by Katrina Stack, RN Outcome: Adequate for Discharge 05/23/2020 0749 by Katrina Stack, RN Outcome: Progressing

## 2020-05-23 NOTE — Plan of Care (Signed)
  Problem: Education: Goal: Knowledge of warning signs, risks, and behaviors that relate to suicide ideation and self-harm behaviors will improve Outcome: Progressing   Problem: Health Behavior/Discharge (Transition) Planning: Goal: Ability to manage health-related needs will improve Outcome: Progressing   Problem: Coping: Goal: Ability to disclose and discuss thoughts of suicide and self-harm will improve Outcome: Progressing   Problem: Self Esteem: Goal: Ability to verbalize positive feeling about self will improve Outcome: Progressing   Problem: Coping: Goal: Level of anxiety will decrease Outcome: Progressing

## 2020-05-23 NOTE — Progress Notes (Signed)
PROGRESS NOTE    Kristen Padilla  XTG:[id number] DOB: [birth year] DOA: 05/21/2020 PCP: Patient, No Pcp Per    Brief Narrative:  Kristen Padilla is a 29 y.o. female with medical history significant of depression and morbid obesity presents after trying to harm herself by taking anywhere from 20-30 500 mg tablets of Tylenol between 12-1 AM this morning and a bottle of NyQuil.  Patient notes that she has had a lot of stress and been very depressed lately.  acetaminophen level 63->150, and salicylate level less than 7.  UDS was negative.Poison control had been contacted and it was recommended to initiate Mucomyst due to the rising acetaminophen levels.    Her levels have improved.  She is medically clear to be transferred to inpatient psychiatry.  Social work working on [employer]:   Psychiatry  Procedures:   Antimicrobials:       Subjective: Is asking me if she could wear home on close onset of hospital close otherwise has no complaints  Objective: Vitals:   05/22/20 1419 05/22/20 2217 05/23/20 0547 05/23/20 0944  BP: 123/86 117/73 106/72 117/77  Pulse: 80 77 74 83  Resp: 19 18 18 18   Temp: 98.7 F (37.1 C) 97.9 F (36.6 C) 97.8 F (36.6 C) 98 F (36.7 C)  TempSrc: Oral Oral Oral Oral  SpO2: 98% 98% 96% 98%  Weight:  121.7 kg    Height:        Intake/Output Summary (Last 24 hours) at 05/23/2020 1040 Last data filed at 05/23/2020 0900 Gross per 24 hour  Intake 2656.81 ml  Output 0 ml  Net 2656.81 ml   Filed Weights   05/21/20 0242 05/22/20 2217  Weight: 113.4 kg 121.7 kg    Examination: Calm, comfortable CTA, no wheeze rales rhonchi's Regular S1-S2 no murmurs Soft abdomen positive bowel sounds nontender nondistended No edema Alert oriented x3, grossly intact Mood and affect appropriate in current setting    Data Reviewed: I have personally reviewed following labs and imaging studies  CBC: Recent Labs  Lab 05/21/20 0331  05/22/20 0326 05/23/20 0119  WBC 11.9* 10.9* 11.1*  NEUTROABS 7.5  --   --   HGB 14.2 14.3 14.9  HCT 42.5 41.4 43.6  MCV 89.9 88.8 88.6  PLT 369 332 371   Basic Metabolic Panel: Recent Labs  Lab 05/21/20 0331 05/22/20 0326 05/23/20 0119  NA 139 139 137  K 4.6 3.9 4.1  CL 106 107 106  CO2 25 19* 18*  GLUCOSE 120* 109* 81  BUN 9 9 8   CREATININE 0.87 0.69 0.67  CALCIUM 9.0 8.9 9.2   GFR: Estimated Creatinine Clearance: 130.1 mL/min (by C-G formula based on SCr of 0.67 mg/dL). Liver Function Tests: Recent Labs  Lab 05/21/20 0331 05/22/20 0326  AST 31 17  ALT 26 22  ALKPHOS 59 53  BILITOT 0.4 0.4  PROT 6.7 7.2  ALBUMIN 3.4* 3.3*   No results for input(s): LIPASE, AMYLASE in the last 168 hours. No results for input(s): AMMONIA in the last 168 hours. Coagulation Profile: Recent Labs  Lab 05/21/20 1237 05/22/20 0326  INR 1.1 1.1   Cardiac Enzymes: No results for input(s): CKTOTAL, CKMB, CKMBINDEX, TROPONINI in the last 168 hours. BNP (last 3 results) No results for input(s): PROBNP in the last 8760 hours. HbA1C: No results for input(s): HGBA1C in the last 72 hours. CBG: No results for input(s): GLUCAP in the last 168 hours. Lipid Profile: No results for input(s): CHOL, HDL, LDLCALC,  TRIG, CHOLHDL, LDLDIRECT in the last 72 hours. Thyroid Function Tests: No results for input(s): TSH, T4TOTAL, FREET4, T3FREE, THYROIDAB in the last 72 hours. Anemia Panel: No results for input(s): VITAMINB12, FOLATE, FERRITIN, TIBC, IRON, RETICCTPCT in the last 72 hours. Sepsis Labs: No results for input(s): PROCALCITON, LATICACIDVEN in the last 168 hours.  Recent Results (from the past 240 hour(s))  Respiratory Panel by RT PCR (Flu A&B, Covid) - Nasopharyngeal Swab     Status: None   Collection Time: 05/21/20  3:31 AM   Specimen: Nasopharyngeal Swab; Nasopharyngeal(NP) swabs in vial transport medium  Result Value Ref Range Status   SARS Coronavirus 2 by RT PCR NEGATIVE  NEGATIVE Final    Comment: (NOTE) SARS-CoV-2 target nucleic acids are NOT DETECTED.  The SARS-CoV-2 RNA is generally detectable in upper respiratoy specimens during the acute phase of infection. The lowest concentration of SARS-CoV-2 viral copies this assay can detect is 131 copies/mL. A negative result does not preclude SARS-Cov-2 infection and should not be used as the sole basis for treatment or other patient management decisions. A negative result may occur with  improper specimen collection/handling, submission of specimen other than nasopharyngeal swab, presence of viral mutation(s) within the areas targeted by this assay, and inadequate number of viral copies (<131 copies/mL). A negative result must be combined with clinical observations, patient history, and epidemiological information. The expected result is Negative.  Fact Sheet for Patients:  https://www.moore.com/  Fact Sheet for Healthcare Providers:  https://www.young.biz/  This test is no t yet approved or cleared by the [place] FDA and  has been authorized for detection and/or diagnosis of SARS-CoV-2 by FDA under an Emergency Use Authorization (EUA). This EUA will remain  in effect (meaning this test can be used) for the duration of the COVID-19 declaration under Section 564(b)(1) of the Act, 21 U.S.C. section 360bbb-3(b)(1), unless the authorization is terminated or revoked sooner.     Influenza A by PCR NEGATIVE NEGATIVE Final   Influenza B by PCR NEGATIVE NEGATIVE Final    Comment: (NOTE) The Xpert Xpress SARS-CoV-2/FLU/RSV assay is intended as an aid in  the diagnosis of influenza from Nasopharyngeal swab specimens and  should not be used as a sole basis for treatment. Nasal washings and  aspirates are unacceptable for Xpert Xpress SARS-CoV-2/FLU/RSV  testing.  Fact Sheet for Patients: https://www.moore.com/  Fact Sheet for Healthcare  Providers: https://www.young.biz/  This test is not yet approved or cleared by the [place] FDA and  has been authorized for detection and/or diagnosis of SARS-CoV-2 by  FDA under an Emergency Use Authorization (EUA). This EUA will remain  in effect (meaning this test can be used) for the duration of the  Covid-19 declaration under Section 564(b)(1) of the Act, 21  U.S.C. section 360bbb-3(b)(1), unless the authorization is  terminated or revoked. Performed at [facility], [address], [place], [place] [address]          Radiology Studies: No results found.      Scheduled Meds: . enoxaparin (LOVENOX) injection  55 mg Subcutaneous Daily  . sodium chloride flush  3 mL Intravenous Q12H   Continuous Infusions: . sodium chloride 75 mL/hr at 05/23/20 0900    Assessment & Plan:   Principal Problem:   Tylenol overdose, intentional self-harm, initial encounter [facility]) Active Problems:   Morbid obesity (HCC)   Depression   Leukocytosis   Overdose   Tylenol overdose, Intentional self harm:  Suicide precautions with 1:1 sit Tylenol levels  now normal Was treated with Mucomyst, completed doses LFTs stable Psychiatrist input was appreciated-plan for inpatient psych admission. She is medically cleared and I spoke to psych NP about this. Social work working for placement If patient attempts or expresses interest in leaving, she will need to be placed under IVC We will keep 1: 1 safety sitter at all times     Leukocytosis: Acute.   Likely stress induced, afebrile  No antibiotics necessary   Depression: Patient currently not on any antidepressant medications  Plan for inpatient psychiatry as mentioned above      Morbid obesity: BMI 40.73 kg/m Complicates overall medical issues We will need to follow-up a weight loss regimen as outpatient     DVT prophylaxis: Lovenox Code Status: Full Family Communication: Husband at  bedside Disposition Plan:  Status is: Inpatient  Remains inpatient appropriate because:Unsafe d/c plan,    Dispo: The patient is from: Home              Anticipated d/c is to: psych inpatient              Anticipated d/c date is: 1 day              Patient currently medically stable. Inpatient pschy bed pending            LOS: 1 day   Time spent: 35 minutes with more than 50% on COC   Lynn Ito, MD [facility] Pager [phone]  If 7PM-7AM, please contact night-coverage [email or web address] Password [email or web address] 05/23/2020, 10:40 AM

## 2020-05-24 NOTE — Progress Notes (Signed)
PROGRESS NOTE    [name]  UDJ:[id number] DOB: 10-16-1990 DOA: 05/21/2020 PCP: Patient, No Pcp Per    Brief Narrative:  ARIYONNA Padilla is a 29 y.o. female with medical history significant of depression and morbid obesity presents after trying to harm herself by taking anywhere from 20-30 500 mg tablets of Tylenol between 12-1 AM 11/21 morning and a bottle of NyQuil.  Patient notes that she has had a lot of stress and been very depressed lately.  acetaminophen level 63->150, and salicylate level less than 7.  UDS was negative.treated with N acetylcysteine per poison control recommendations   Assessment & Plan:   Tylenol overdose, Intentional self harm:  -Treated with NAC per poison control recommendations -Tylenol levels have improved, LFTs stable -Psych consult appreciated, recommended inpatient psych and one-to-one sitter -She is medically stable for discharge to inpatient psych when bed available  Leukocytosis: -Likely reactive  Major depression -As above  Morbid obesity: BMI 40.73 kg/m -Needs lifestyle modification  DVT prophylaxis: Lovenox Code Status: Full Family Communication: Husband at bedside Disposition Plan:  Status is: Inpatient  Remains inpatient appropriate because:Unsafe d/c plan,    Dispo: The patient is from: Home              Anticipated d/c is to: psych inpatient              Anticipated d/c date is: 1 day              Patient currently medically stable. Inpatient pschy bed pending  Consultants:   Psychiatry  Procedures:   Antimicrobials:       Subjective: No issues overnight Objective: Vitals:   05/23/20 1645 05/23/20 2119 05/24/20 0546 05/24/20 0911  BP: 122/72 124/80 105/66 102/73  Pulse: 75 97 76 74  Resp: 19 18 20 18   Temp: 97.9 F (36.6 C) 98 F (36.7 C) 98.1 F (36.7 C) 97.8 F (36.6 C)  TempSrc: Oral Oral Oral Oral  SpO2: 97% 98% 97% 98%  Weight:      Height:        Intake/Output  Summary (Last 24 hours) at 05/24/2020 1459 Last data filed at 05/24/2020 1420 Gross per 24 hour  Intake 2137.35 ml  Output 0 ml  Net 2137.35 ml   Filed Weights   05/21/20 0242 05/22/20 2217  Weight: 113.4 kg 121.7 kg    Examination: Obese pleasant female sitting up in bed, AAOx3, no distress CVS: S1-S2, regular rate rhythm Lungs: Clear bilaterally Abdomen: Obese, soft, nontender, bowel sounds present  extremities: No edema  Data Reviewed: I have personally reviewed following labs and imaging studies  CBC: Recent Labs  Lab 05/21/20 0331 05/22/20 0326 05/23/20 0119  WBC 11.9* 10.9* 11.1*  NEUTROABS 7.5  --   --   HGB 14.2 14.3 14.9  HCT 42.5 41.4 43.6  MCV 89.9 88.8 88.6  PLT 369 332 371   Basic Metabolic Panel: Recent Labs  Lab 05/21/20 0331 05/22/20 0326 05/23/20 0119  NA 139 139 137  K 4.6 3.9 4.1  CL 106 107 106  CO2 25 19* 18*  GLUCOSE 120* 109* 81  BUN 9 9 8   CREATININE 0.87 0.69 0.67  CALCIUM 9.0 8.9 9.2   GFR: Estimated Creatinine Clearance: 130.1 mL/min (by C-G formula based on SCr of 0.67 mg/dL). Liver Function Tests: Recent Labs  Lab 05/21/20 0331 05/22/20 0326  AST 31 17  ALT 26 22  ALKPHOS 59 53  BILITOT 0.4 0.4  PROT 6.7 7.2  ALBUMIN 3.4* 3.3*   No results for input(s): LIPASE, AMYLASE in the last 168 hours. No results for input(s): AMMONIA in the last 168 hours. Coagulation Profile: Recent Labs  Lab 05/21/20 1237 05/22/20 0326  INR 1.1 1.1   Cardiac Enzymes: No results for input(s): CKTOTAL, CKMB, CKMBINDEX, TROPONINI in the last 168 hours. BNP (last 3 results) No results for input(s): PROBNP in the last 8760 hours. HbA1C: No results for input(s): HGBA1C in the last 72 hours. CBG: No results for input(s): GLUCAP in the last 168 hours. Lipid Profile: No results for input(s): CHOL, HDL, LDLCALC, TRIG, CHOLHDL, LDLDIRECT in the last 72 hours. Thyroid Function Tests: No results for input(s): TSH, T4TOTAL, FREET4, T3FREE,  THYROIDAB in the last 72 hours. Anemia Panel: No results for input(s): VITAMINB12, FOLATE, FERRITIN, TIBC, IRON, RETICCTPCT in the last 72 hours. Sepsis Labs: No results for input(s): PROCALCITON, LATICACIDVEN in the last 168 hours.  Recent Results (from the past 240 hour(s))  Respiratory Panel by RT PCR (Flu A&B, Covid) - Nasopharyngeal Swab     Status: None   Collection Time: 05/21/20  3:31 AM   Specimen: Nasopharyngeal Swab; Nasopharyngeal(NP) swabs in vial transport medium  Result Value Ref Range Status   SARS Coronavirus 2 by RT PCR NEGATIVE NEGATIVE Final    Comment: (NOTE) SARS-CoV-2 target nucleic acids are NOT DETECTED.  The SARS-CoV-2 RNA is generally detectable in upper respiratoy specimens during the acute phase of infection. The lowest concentration of SARS-CoV-2 viral copies this assay can detect is 131 copies/mL. A negative result does not preclude SARS-Cov-2 infection and should not be used as the sole basis for treatment or other patient management decisions. A negative result may occur with  improper specimen collection/handling, submission of specimen other than nasopharyngeal swab, presence of viral mutation(s) within the areas targeted by this assay, and inadequate number of viral copies (<131 copies/mL). A negative result must be combined with clinical observations, patient history, and epidemiological information. The expected result is Negative.  Fact Sheet for Patients:  https://www.moore.com/  Fact Sheet for Healthcare Providers:  https://www.young.biz/  This test is no t yet approved or cleared by the [place] FDA and  has been authorized for detection and/or diagnosis of SARS-CoV-2 by FDA under an Emergency Use Authorization (EUA). This EUA will remain  in effect (meaning this test can be used) for the duration of the COVID-19 declaration under Section 564(b)(1) of the Act, 21 U.S.C. section 360bbb-3(b)(1),  unless the authorization is terminated or revoked sooner.     Influenza A by PCR NEGATIVE NEGATIVE Final   Influenza B by PCR NEGATIVE NEGATIVE Final    Comment: (NOTE) The Xpert Xpress SARS-CoV-2/FLU/RSV assay is intended as an aid in  the diagnosis of influenza from Nasopharyngeal swab specimens and  should not be used as a sole basis for treatment. Nasal washings and  aspirates are unacceptable for Xpert Xpress SARS-CoV-2/FLU/RSV  testing.  Fact Sheet for Patients: https://www.moore.com/  Fact Sheet for Healthcare Providers: https://www.young.biz/  This test is not yet approved or cleared by the [place] FDA and  has been authorized for detection and/or diagnosis of SARS-CoV-2 by  FDA under an Emergency Use Authorization (EUA). This EUA will remain  in effect (meaning this test can be used) for the duration of the  Covid-19 declaration under Section 564(b)(1) of the Act, 21  U.S.C. section 360bbb-3(b)(1), unless the authorization is  terminated or revoked. Performed at [facility], [address] [place], [place] [address]  Radiology Studies: No results found.      Scheduled Meds: . enoxaparin (LOVENOX) injection  55 mg Subcutaneous Daily  . sodium chloride flush  3 mL Intravenous Q12H   Continuous Infusions: . sodium chloride 75 mL/hr at 05/23/20 1938     LOS: 2 days   Time spent: 25 minutes with more than 50% on COC   [name], MD [facility] 05/24/2020, 2:59 PM

## 2020-05-24 NOTE — TOC Progression Note (Signed)
Transition of Care [facility]) - Progression Note    Patient Details  Name: Kristen Padilla MRN: [id number] Date of Birth: 1991-04-26  [facility]) CM/SW Contact  Carmina Miller, LCSWA Phone Number: 05/24/2020, 2:42 PM  Clinical Narrative:    CSW reached out to [facility] as requested, they are at capacity and have no beds available.         Expected Discharge Plan and Services           Expected Discharge Date: 05/23/20                                     Social Determinants of Health (SDOH) Interventions    Readmission Risk Interventions No flowsheet data found.

## 2020-05-25 NOTE — Progress Notes (Signed)
PROGRESS NOTE    Kristen Padilla  YSA:[id number] DOB: 1991-02-25 DOA: 05/21/2020 PCP: Patient, No Pcp Per    Brief Narrative:  Kristen Padilla is a 29 y.o. female with medical history significant of depression and morbid obesity presents after trying to harm herself by taking anywhere from 20-30 500 mg tablets of Tylenol between 12-1 AM 11/21 morning and a bottle of NyQuil.  Patient notes that she has had a lot of stress and been very depressed lately.  acetaminophen level 63->150, and salicylate level less than 7.  UDS was negative.treated with N acetylcysteine per poison control recommendations   Assessment & Plan:   Tylenol overdose, Intentional self harm:  Major depression -Treated with NAC per poison control recommendations -Tylenol levels have improved, LFTs stable -Psych consult appreciated, recommended inpatient psych and one-to-one sitter -Patient remains medically stable for discharge to inpatient psych when bed available  Leukocytosis: -Likely reactive  Major depression -As above  Morbid obesity: BMI 40.73 kg/m -Needs lifestyle modification  DVT prophylaxis: Lovenox Code Status: Full Family Communication: Husband at bedside Disposition Plan:  Status is: Inpatient  Remains inpatient appropriate because: Medically stable to discharge awaiting psych bed   Dispo: The patient is from: Home              Anticipated d/c is to: psych inpatient              Anticipated d/c date is: Unknown              Patient currently medically stable. Inpatient pschy bed pending  Consultants:   Psychiatry  Procedures:   Antimicrobials:       Subjective: -Feels okay overall, no events overnight, awaiting psych bed  Objective: Vitals:   05/24/20 1700 05/24/20 2033 05/25/20 0452 05/25/20 0959  BP: 112/66 110/69 99/60 (!) 106/55  Pulse: 80 75 77 74  Resp: 18 18 18 18   Temp: 99.1 F (37.3 C) 98.2 F (36.8 C) 98 F (36.7 C) 98.6 F (37  C)  TempSrc: Oral Oral Oral Oral  SpO2: 100% 96% 98% 96%  Weight:      Height:        Intake/Output Summary (Last 24 hours) at 05/25/2020 1415 Last data filed at 05/25/2020 0800 Gross per 24 hour  Intake 1020 ml  Output 0 ml  Net 1020 ml   Filed Weights   05/21/20 0242 05/22/20 2217  Weight: 113.4 kg 121.7 kg    Examination: Obese pleasant female sitting up in bed, AAOx3, no distress CVS: S1-S2, regular rate rhythm Lungs: Clear bilaterally Abdomen: Soft, obese, nontender, bowel sounds present Extremities: No edema   Data Reviewed: I have personally reviewed following labs and imaging studies  CBC: Recent Labs  Lab 05/21/20 0331 05/22/20 0326 05/23/20 0119  WBC 11.9* 10.9* 11.1*  NEUTROABS 7.5  --   --   HGB 14.2 14.3 14.9  HCT 42.5 41.4 43.6  MCV 89.9 88.8 88.6  PLT 369 332 371   Basic Metabolic Panel: Recent Labs  Lab 05/21/20 0331 05/22/20 0326 05/23/20 0119  NA 139 139 137  K 4.6 3.9 4.1  CL 106 107 106  CO2 25 19* 18*  GLUCOSE 120* 109* 81  BUN 9 9 8   CREATININE 0.87 0.69 0.67  CALCIUM 9.0 8.9 9.2   GFR: Estimated Creatinine Clearance: 130.1 mL/min (by C-G formula based on SCr of 0.67 mg/dL). Liver Function Tests: Recent Labs  Lab 05/21/20 0331 05/22/20 0326  AST 31 17  ALT 26 22  ALKPHOS 59 53  BILITOT 0.4 0.4  PROT 6.7 7.2  ALBUMIN 3.4* 3.3*   No results for input(s): LIPASE, AMYLASE in the last 168 hours. No results for input(s): AMMONIA in the last 168 hours. Coagulation Profile: Recent Labs  Lab 05/21/20 1237 05/22/20 0326  INR 1.1 1.1   Cardiac Enzymes: No results for input(s): CKTOTAL, CKMB, CKMBINDEX, TROPONINI in the last 168 hours. BNP (last 3 results) No results for input(s): PROBNP in the last 8760 hours. HbA1C: No results for input(s): HGBA1C in the last 72 hours. CBG: No results for input(s): GLUCAP in the last 168 hours. Lipid Profile: No results for input(s): CHOL, HDL, LDLCALC, TRIG, CHOLHDL, LDLDIRECT in  the last 72 hours. Thyroid Function Tests: No results for input(s): TSH, T4TOTAL, FREET4, T3FREE, THYROIDAB in the last 72 hours. Anemia Panel: No results for input(s): VITAMINB12, FOLATE, FERRITIN, TIBC, IRON, RETICCTPCT in the last 72 hours. Sepsis Labs: No results for input(s): PROCALCITON, LATICACIDVEN in the last 168 hours.  Recent Results (from the past 240 hour(s))  Respiratory Panel by RT PCR (Flu A&B, Covid) - Nasopharyngeal Swab     Status: None   Collection Time: 05/21/20  3:31 AM   Specimen: Nasopharyngeal Swab; Nasopharyngeal(NP) swabs in vial transport medium  Result Value Ref Range Status   SARS Coronavirus 2 by RT PCR NEGATIVE NEGATIVE Final    Comment: (NOTE) SARS-CoV-2 target nucleic acids are NOT DETECTED.  The SARS-CoV-2 RNA is generally detectable in upper respiratoy specimens during the acute phase of infection. The lowest concentration of SARS-CoV-2 viral copies this assay can detect is 131 copies/mL. A negative result does not preclude SARS-Cov-2 infection and should not be used as the sole basis for treatment or other patient management decisions. A negative result may occur with  improper specimen collection/handling, submission of specimen other than nasopharyngeal swab, presence of viral mutation(s) within the areas targeted by this assay, and inadequate number of viral copies (<131 copies/mL). A negative result must be combined with clinical observations, patient history, and epidemiological information. The expected result is Negative.  Fact Sheet for Patients:  https://www.moore.com/  Fact Sheet for Healthcare Providers:  https://www.young.biz/  This test is no t yet approved or cleared by the [place] FDA and  has been authorized for detection and/or diagnosis of SARS-CoV-2 by FDA under an Emergency Use Authorization (EUA). This EUA will remain  in effect (meaning this test can be used) for the duration  of the COVID-19 declaration under Section 564(b)(1) of the Act, 21 U.S.C. section 360bbb-3(b)(1), unless the authorization is terminated or revoked sooner.     Influenza A by PCR NEGATIVE NEGATIVE Final   Influenza B by PCR NEGATIVE NEGATIVE Final    Comment: (NOTE) The Xpert Xpress SARS-CoV-2/FLU/RSV assay is intended as an aid in  the diagnosis of influenza from Nasopharyngeal swab specimens and  should not be used as a sole basis for treatment. Nasal washings and  aspirates are unacceptable for Xpert Xpress SARS-CoV-2/FLU/RSV  testing.  Fact Sheet for Patients: https://www.moore.com/  Fact Sheet for Healthcare Providers: https://www.young.biz/  This test is not yet approved or cleared by the [place] FDA and  has been authorized for detection and/or diagnosis of SARS-CoV-2 by  FDA under an Emergency Use Authorization (EUA). This EUA will remain  in effect (meaning this test can be used) for the duration of the  Covid-19 declaration under Section 564(b)(1) of the Act, 21  U.S.C. section 360bbb-3(b)(1), unless the authorization is  terminated or revoked. Performed  at [facility], [address], [place], [place] [address]          Radiology Studies: No results found.      Scheduled Meds: . enoxaparin (LOVENOX) injection  55 mg Subcutaneous Daily  . sodium chloride flush  3 mL Intravenous Q12H   Continuous Infusions: . sodium chloride 75 mL/hr at 05/23/20 1938     LOS: 3 days   Time spent: 15 minutes with more than 50% on COC   [name], MD [facility] 05/25/2020, 2:15 PM

## 2020-05-25 NOTE — TOC Progression Note (Signed)
Transition of Care [facility]) - Progression Note    Patient Details  Name: Kristen Padilla MRN: [id number] Date of Birth: 09-24-1990  Transition of Care [facility]) CM/SW Contact  Carley Hammed, LCSWA Phone Number: 05/25/2020, 11:23 AM  Clinical Narrative:     Nurse updated CSW that pt was wanting an update on DC plans. Facilities are either not accepting due to holiday or have no bed availability. CSW updated pt and she noted understanding. SW will continue to follow for DC planning.       Expected Discharge Plan and Services           Expected Discharge Date: 05/23/20                                     Social Determinants of Health (SDOH) Interventions    Readmission Risk Interventions No flowsheet data found.

## 2020-05-26 MED ORDER — ENOXAPARIN SODIUM 60 MG/0.6ML ~~LOC~~ SOLN
60.0000 mg | Freq: Every day | SUBCUTANEOUS | Status: DC
  Administered 2020-05-26 – 2020-05-27 (×2): 60 mg via SUBCUTANEOUS
  Filled 2020-05-26 (×3): qty 0.6

## 2020-05-26 NOTE — Progress Notes (Signed)
No changes, awaiting inpatient Psych bed  [name], MD

## 2020-05-27 NOTE — Progress Notes (Signed)
Patient seen and examined, in good spirits, husband at bedside -Awaiting inpatient psych bed -will ask psych to re-evaluate tomorrow  [name], MD

## 2020-05-27 NOTE — Progress Notes (Signed)
Nurse tech informed nurse of over hearing a conversation of patient on phone with sister.  Nurse Tech understood it to be that husband had told patient to tell the staff what they needed to hear to come home.  Was not able to follow up with patient because husband was bedside.

## 2020-05-28 MED ORDER — SERTRALINE HCL 50 MG PO TABS: 25.0000 mg | ORAL_TABLET | Freq: Every day | ORAL | Status: DC

## 2020-05-28 MED ORDER — SERTRALINE HCL 25 MG PO TABS
25.0000 mg | ORAL_TABLET | Freq: Every day | ORAL | 0 refills | Status: DC
Start: 2020-05-28 — End: 2020-06-12

## 2020-05-28 NOTE — Progress Notes (Signed)
DISCHARGE NOTE HOME Kristen Padilla to be discharged Home per MD order. Discussed prescriptions and follow up appointments with the patient. Prescriptions given to patient; medication list explained in detail. Patient verbalized understanding.  Skin clean, dry and intact without evidence of skin break down, no evidence of skin tears noted. IV catheter discontinued intact. Site without signs and symptoms of complications. Dressing and pressure applied. Pt denies pain at the site currently. No complaints noted.  Patient free of lines, drains, and wounds.   An After Visit Summary (AVS) was printed and given to the patient. Patient escorted via wheelchair, and discharged home via private auto.  Velia Meyer, RN

## 2020-05-28 NOTE — Consult Note (Addendum)
[name] Face-to-Face Psychiatry Consult   Reason for Consult: Suicide attempt Referring Physician: Internal medicine -[name] Patient Identification: Kristen Padilla MRN:  [id number] Principal Diagnosis: Tylenol overdose, intentional self-harm, initial encounter [facility]) Diagnosis:  Principal Problem:   Tylenol overdose, intentional self-harm, initial encounter [facility]) Active Problems:   Morbid obesity (HCC)   Depression   Leukocytosis   Overdose   Total Time spent with patient: 15 minutes  Subjective:   Kristen Padilla is a 29 y.o. female was seen and evaluated face-to-face.  Patient is awake, alert and oriented x3.  she presents flat but pleasant.  Denying suicidal or homicidal ideations.  Denies auditory or visual hallucinations.  Patient reports "feeling more depressed sitting in the room, awaiting for bed to become available."  Stated " I got really overwhelmed, due to the holidays."  Denied previous inpatient admissions.  Denies that she is being followed by therapy or psychiatry in the past. Patient reports family history of mental illness.  States her mother struggles with depression and anxiety.  Kristen Padilla states she is receptive to initiating antidepressant and attending partial hospitalization programming at [facility].  Case staffed with attending psychiatrist Lucianne Muss.  Support, encouragement and reassurance was provided.   Patient provided verbal authorization to follow-up with husband for additional collateral.  NP spoke to [place] at [phone].  He denied any safety concerns with patient returning home.  Denies that patient has access to guns or weapons.  Reports he plans to stay at home to ensure patient safety, and is willing to take her to additional outpatient follow-ups.   HPI:  Per admission assessment note: Kristen Couts LEOS-TORRESis a 29 y.o.femalewith medical history significant ofdepression and morbid obesity presents after trying to harm herself  by taking anywhere from 20-30500 mg tabletsof Tylenol between 12-1 AM this morning and a bottle of NyQuil. Patient notes that she has had a lot of stress and been very depressed lately.acetaminophen level 63->150, and salicylate level less than 7.UDS was negative.Poison control hadbeen contacted and it was recommended to initiate Mucomyst due to the rising acetaminophen levels.  Past Psychiatric History:   Risk to Self:   Risk to Others:   Prior Inpatient Therapy:   Prior Outpatient Therapy:    Past Medical History:  Past Medical History:  Diagnosis Date  . Depression   . Miscarriage   . Morbid obesity (HCC)     Past Surgical History:  Procedure Laterality Date  . HERNIA REPAIR    . Repair of displaced fracture of the left radius  03/2020   Family History:  Family History  Problem Relation Age of Onset  . Diabetes Mother   . Hypertension Mother   . Depression Mother   . Hypertension Father    Family Psychiatric  History:  Social History:  Social History   Substance and Sexual Activity  Alcohol Use No     Social History   Substance and Sexual Activity  Drug Use No    Social History   Socioeconomic History  . Marital status: Married    Spouse name: Not on file  . Number of children: Not on file  . Years of education: Not on file  . Highest education level: Not on file  Occupational History  . Not on file  Tobacco Use  . Smoking status: Never Smoker  . Smokeless tobacco: Never Used  Substance and Sexual Activity  . Alcohol use: No  . Drug use: No  . Sexual activity: Yes    Birth  control/protection: None  Other Topics Concern  . Not on file  Social History Narrative  . Not on file   Social Determinants of Health   Financial Resource Strain:   . Difficulty of Paying Living Expenses: Not on file  Food Insecurity:   . Worried About Programme researcher, broadcasting/film/video in the Last Year: Not on file  . Ran Out of Food in the Last Year: Not on file  Transportation  Needs:   . Lack of Transportation (Medical): Not on file  . Lack of Transportation (Non-Medical): Not on file  Physical Activity:   . Days of Exercise per Week: Not on file  . Minutes of Exercise per Session: Not on file  Stress:   . Feeling of Stress : Not on file  Social Connections:   . Frequency of Communication with Friends and Family: Not on file  . Frequency of Social Gatherings with Friends and Family: Not on file  . Attends Religious Services: Not on file  . Active Member of Clubs or Organizations: Not on file  . Attends Banker Meetings: Not on file  . Marital Status: Not on file   Additional Social History:    Allergies:   Allergies  Allergen Reactions  . Aripiprazole Rash  . Sulfa Antibiotics Rash    Labs: No results found for this or any previous visit (from the past 48 hour(s)).  Current Facility-Administered Medications  Medication Dose Route Frequency Provider Last Rate Last Admin  . 0.9 %  sodium chloride infusion   Intravenous Continuous Lynn Ito, MD 75 mL/hr at 05/23/20 1938 New Bag at 05/23/20 1938  . enoxaparin (LOVENOX) injection 60 mg  60 mg Subcutaneous Daily Tamera Reason, RPH   60 mg at 05/27/20 1150  . metoprolol tartrate (LOPRESSOR) injection 5 mg  5 mg Intravenous Q6H PRN Katrinka Blazing, Rondell A, MD      . ondansetron (ZOFRAN) tablet 4 mg  4 mg Oral Q6H PRN Madelyn Flavors A, MD       Or  . ondansetron (ZOFRAN) injection 4 mg  4 mg Intravenous Q6H PRN Smith, Rondell A, MD      . sodium chloride flush (NS) 0.9 % injection 3 mL  3 mL Intravenous Q12H Katrinka Blazing, Rondell A, MD   3 mL at 05/27/20 2152    Musculoskeletal: Strength & Muscle Tone: within normal limits Gait & Station: normal Patient leans: N/A  Psychiatric Specialty Exam: Physical Exam Vitals reviewed.  Neurological:     Mental Status: She is alert.  Psychiatric:        Mood and Affect: Mood normal.     Review of Systems  Psychiatric/Behavioral: Negative for  hallucinations and suicidal ideas. The patient is nervous/anxious.   All other systems reviewed and are negative.   Blood pressure 107/74, pulse 87, temperature 97.9 F (36.6 C), temperature source Oral, resp. rate 18, height 5\' 2"  (1.575 m), weight 121.7 kg, SpO2 93 %.Body mass index is 49.07 kg/m.  General Appearance: Casual  Eye Contact:  Good  Speech:  Clear and Coherent  Volume:  Normal  Mood:  Anxious and Depressed  Affect:  Congruent  Thought Process:  Coherent  Orientation:  Full (Time, Place, and Person)  Thought Content:  Hallucinations: None  Suicidal Thoughts:  No  Homicidal Thoughts:  No  Memory:  Immediate;   Fair Recent;   Fair  Judgement:  Fair  Insight:  Fair  Psychomotor Activity:  Normal  Concentration:  Concentration: Fair  Recall:  Good  Fund of Knowledge:  Fair  Language:  Good  Akathisia:  No  Handed:  Right  AIMS (if indicated):     Assets:  Communication Skills Desire for Improvement Resilience Social Support  ADL's:  Intact  Cognition:  WNL  Sleep:        Treatment Plan Summary: Daily contact with patient to assess and evaluate symptoms and progress in treatment and Medication management  -CSW to provide additional outpatient resources -Follow-up with [facility] Partial hospitalization program -Discussed initiating antidepressant patient was receptive to plan ie Zoloft 25 mg (with titration)  Disposition: No evidence of imminent risk to self or others at present.   Patient does not meet criteria for psychiatric inpatient admission. Supportive therapy provided about ongoing stressors. Refer to IOP. Discussed crisis plan, support from social network, calling 911, coming to the Emergency Department, and calling Suicide Hotline.   Thank you for contacting [facility]  Oneta Rack, NP 05/28/2020 11:06 AM

## 2020-05-28 NOTE — Progress Notes (Addendum)
This Education officer, museum met with patient at bedside. Patient's mother in room. Permission granted to discuss discharge plan. Patient discharging home today, mental health resources requested. Patient provided with outpatient resources for mental health follow up including contact number to [facility] in the event that thoughts of self harm re-surface or she is faced with future mental health challenges. Outpatient follow up emphasized and encouraged. Patient discussed being receptive to outpatient follow up and medication adherence.    [address], LCSW Transition of Care [phone]

## 2020-06-05 ENCOUNTER — Ambulatory Visit (INDEPENDENT_AMBULATORY_CARE_PROVIDER_SITE_OTHER): Payer: No Payment, Other | Admitting: Licensed Clinical Social Worker

## 2020-06-05 ENCOUNTER — Other Ambulatory Visit: Payer: Self-pay

## 2020-06-05 DIAGNOSIS — F322 Major depressive disorder, single episode, severe without psychotic features: Secondary | ICD-10-CM

## 2020-06-05 DIAGNOSIS — F411 Generalized anxiety disorder: Secondary | ICD-10-CM | POA: Insufficient documentation
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
## 2020-06-07 NOTE — Discharge Summary (Signed)
Physician Discharge Summary  AYAME RENA VQM:[id number] DOB: 1990/12/07 DOA: 05/21/2020  PCP: Patient, No Pcp Per  Admit date: 05/21/2020 Discharge date: 05/28/2020  Time spent: 35 minutes  Recommendations for Outpatient Follow-up:  1. Outpatient psychiatry in 1 week   Discharge Diagnoses:  Principal Problem:   Tylenol overdose, intentional self-harm, initial encounter [facility]) Active Problems:   Morbid obesity (HCC)   Depression   Leukocytosis   Overdose   Discharge Condition: Stable  Diet recommendation: Low calorie diet  Filed Weights   05/21/20 0242 05/22/20 2217  Weight: 113.4 kg 121.7 kg    History of present illness:  Kristen Behan LEOS-TORRESis a 29 y.o.femalewith medical history significant ofdepression and morbid obesity presents after trying to harm herself by taking anywhere from 20-30500 mg tabletsof Tylenol between 12-1 AM 11/21 morning and a bottle of NyQuil. Patient notes that she has had a lot of stress and been very depressed lately.  acetaminophen level 63->150, and salicylate level less than 7.UDS was negative.treated with N acetylcysteine per poison control recommendations  Hospital Course:   Tylenol overdose, Intentionalself harm:  Major depression -Treated with NAC per poison control recommendations -Tylenol levels have improved, LFTs stable -Psych consult appreciated, initially recommended inpatient psych and one-to-one sitter -Patient remained stable for about a week awaiting inpatient psych bed, psychiatry was reconsulted, seen again on 11/28 and felt to have improved and cleared for outpatient psych follow-up, recommended starting low-dose Zoloft at discharge. -Seen by social worker, outpatient resources given  Leukocytosis: -Likely reactive, improved  Major depression -As above  Morbid obesity: BMI 40.73 kg/m -Needs lifestyle modification   Discharge Exam: Vitals:   05/28/20 0358 05/28/20 0942  BP:  108/85 107/74  Pulse: 79 87  Resp: 18 18  Temp: (!) 97.3 F (36.3 C) 97.9 F (36.6 C)  SpO2: 99% 93%    General: AAOx3, no distress Cardiovascular: S1-S2, regular rate rhythm Respiratory: Clear  Discharge Instructions   Discharge Instructions    Diet - low sodium heart healthy   Complete by: As directed    Increase activity slowly   Complete by: As directed      Allergies as of 05/28/2020      Reactions   Aripiprazole Rash   Sulfa Antibiotics Rash      Medication List    TAKE these medications   sertraline 25 MG tablet Commonly known as: ZOLOFT Take 1 tablet (25 mg total) by mouth daily.      Allergies  Allergen Reactions  . Aripiprazole Rash  . Sulfa Antibiotics Rash    Follow-up Information     [facility]. Call.   Why: call to make appointment to establish PCP Contact information: [address] [facility] [address] [phone]               The results of significant diagnostics from this hospitalization (including imaging, microbiology, ancillary and laboratory) are listed below for reference.    Significant Diagnostic Studies: No results found.  Microbiology: No results found for this or any previous visit (from the past 240 hour(s)).   Labs: Basic Metabolic Panel: No results for input(s): NA, K, CL, CO2, GLUCOSE, BUN, CREATININE, CALCIUM, MG, PHOS in the last 168 hours. Liver Function Tests: No results for input(s): AST, ALT, ALKPHOS, BILITOT, PROT, ALBUMIN in the last 168 hours. No results for input(s): LIPASE, AMYLASE in the last 168 hours. No results for input(s): AMMONIA in the last 168 hours. CBC: No results for input(s): WBC,  NEUTROABS, HGB, HCT, MCV, PLT in the last 168 hours. Cardiac Enzymes: No results for input(s): CKTOTAL, CKMB, CKMBINDEX, TROPONINI in the last 168 hours. BNP: BNP (last 3 results) No results for input(s): BNP in the last 8760 hours.  ProBNP (last 3  results) No results for input(s): PROBNP in the last 8760 hours.  CBG: No results for input(s): GLUCAP in the last 168 hours.     Signed:  [name] MD.  [facility] 06/07/2020, 3:42 PM

## 2020-06-12 ENCOUNTER — Other Ambulatory Visit: Payer: Self-pay

## 2020-06-12 ENCOUNTER — Encounter (HOSPITAL_COMMUNITY): Payer: Self-pay | Admitting: Psychiatry

## 2020-06-12 ENCOUNTER — Ambulatory Visit (INDEPENDENT_AMBULATORY_CARE_PROVIDER_SITE_OTHER): Payer: No Payment, Other | Admitting: Psychiatry

## 2020-06-12 VITALS — BP 118/84 | HR 82 | Ht 62.0 in | Wt 250.0 lb

## 2020-06-12 DIAGNOSIS — F4310 Post-traumatic stress disorder, unspecified: Secondary | ICD-10-CM

## 2020-06-12 DIAGNOSIS — F330 Major depressive disorder, recurrent, mild: Secondary | ICD-10-CM | POA: Diagnosis not present

## 2020-06-12 DIAGNOSIS — F411 Generalized anxiety disorder: Secondary | ICD-10-CM | POA: Diagnosis not present

## 2020-06-12 MED ORDER — HYDROXYZINE HCL 10 MG PO TABS: 10.0000 mg | ORAL_TABLET | Freq: Three times a day (TID) | ORAL | 2 refills | Status: DC | PRN

## 2020-06-12 MED ORDER — BUPROPION HCL ER (XL) 150 MG PO TB24: 150.0000 mg | ORAL_TABLET | ORAL | 2 refills | Status: DC
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
## 2020-08-10 ENCOUNTER — Other Ambulatory Visit: Payer: Self-pay

## 2020-08-10 ENCOUNTER — Ambulatory Visit (INDEPENDENT_AMBULATORY_CARE_PROVIDER_SITE_OTHER): Payer: No Payment, Other | Admitting: Licensed Clinical Social Worker

## 2020-08-10 DIAGNOSIS — F321 Major depressive disorder, single episode, moderate: Secondary | ICD-10-CM | POA: Diagnosis not present

## 2020-08-10 DIAGNOSIS — F4310 Post-traumatic stress disorder, unspecified: Secondary | ICD-10-CM

## 2020-08-10 DIAGNOSIS — F411 Generalized anxiety disorder: Secondary | ICD-10-CM
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
## 2020-08-24 ENCOUNTER — Ambulatory Visit (HOSPITAL_COMMUNITY): Payer: No Payment, Other | Admitting: Licensed Clinical Social Worker

## 2020-08-24 ENCOUNTER — Other Ambulatory Visit: Payer: Self-pay

## 2020-08-24 DIAGNOSIS — F4310 Post-traumatic stress disorder, unspecified: Secondary | ICD-10-CM

## 2020-08-24 DIAGNOSIS — F411 Generalized anxiety disorder: Secondary | ICD-10-CM

## 2020-08-24 DIAGNOSIS — F320 Major depressive disorder, single episode, mild: Secondary | ICD-10-CM

## 2020-08-24 NOTE — Progress Notes (Signed)
   THERAPIST PROGRESS NOTE  Session Time: 35  Therapist Response:    Subjective/Objective:  Pt was alert and oriented x 5. She was dressed casually and engaged well in therapy session. Pt presented with depressed and anxious mood/affect. Juliah was cooperative and maintained good eye contact.   Primary stressor for pt is lack of motivation. She states that most days she stays in her house other than when she is walking the dogs. Her only informal support is her spouse and sister. Gracelynne reports that She has been ignoring her sister phone calls and only answers 1 x per week when she calls daily. Pt medications have not been as effective in the last 3 weeks as they were to start which pt states has increased her depression. Reyne denies suicidal or homicidal thoughts during session   Plan/intervention: LCSW used solution focused therapy in today's session. Pt To walk dogs x 2 during the weekday, 1 x per she is to walk with her spouse, and she is to answer her sisters phone calls 3 x per week. Pt is agreeable to plan   Assessment/Plan:   Pt endorses symptoms for isolation, worthlessness, hopelessness, tension, worry, insomnia, and lack of motivation. She does meet criteria for Major depression and GAD. She is taking her medications as prescribed. Lochlyn has f/u appt for medication management in 2 weeks.    Participation Level: Active  Behavioral Response: CasualAlertAnxious and Depressed  Type of Therapy: Individual Therapy  Treatment Goals addressed: Diagnosis: Depression   Interventions: CBT and Supportive  Summary: Kristen Padilla is a 30 y.o. female who presents with GAD and Depression.   Suicidal/Homicidal: Yeswithout intent/plan    Plan: Return again in 4 weeks.      Weber Cooks, LCSW 08/24/2020

## 2020-08-31 ENCOUNTER — Other Ambulatory Visit: Payer: Self-pay

## 2020-08-31 ENCOUNTER — Ambulatory Visit (HOSPITAL_COMMUNITY): Payer: No Payment, Other | Admitting: Licensed Clinical Social Worker

## 2020-08-31 DIAGNOSIS — F320 Major depressive disorder, single episode, mild: Secondary | ICD-10-CM

## 2020-08-31 NOTE — Progress Notes (Signed)
Patient did not present to today's group session. Patient contacted CSW via cell phone and reported she was having connection issues. She reports she will attempt to join tomorrow.    CSW will continue to follow.     Baldo Daub, MSW, LCSW Clinical Child psychotherapist (Facility Based Crisis) [facility]

## 2020-09-01 ENCOUNTER — Ambulatory Visit (HOSPITAL_COMMUNITY): Payer: Self-pay

## 2020-09-04 ENCOUNTER — Ambulatory Visit (INDEPENDENT_AMBULATORY_CARE_PROVIDER_SITE_OTHER): Payer: No Payment, Other | Admitting: Licensed Clinical Social Worker

## 2020-09-04 ENCOUNTER — Other Ambulatory Visit: Payer: Self-pay

## 2020-09-04 ENCOUNTER — Encounter (HOSPITAL_COMMUNITY): Payer: Self-pay

## 2020-09-04 DIAGNOSIS — F411 Generalized anxiety disorder: Secondary | ICD-10-CM

## 2020-09-04 DIAGNOSIS — F321 Major depressive disorder, single episode, moderate: Secondary | ICD-10-CM

## 2020-09-04 NOTE — Progress Notes (Signed)
Spoke with patient via Webex video call, used 2 identifiers to correctly identify patient. States that the program was recommended to her by a counselor at behavioral health. In November 2021 she attempted suicide by OD. Her main stressors are negative people in her life that she has had to cut out. She no longer feels like her Wellbutrin is working. Depression is getting worse. She goes Wednesday to see her psychiatrist and will discuss this with him. Groups are going well, she gets anxious when her name is called to speak but feels it will get better as time goes along and she becomes more comfortable. On scale 1-10 as 10 being worst she rates depression at 7/8 and anxiety at 5. Denies SI/HI or AV hallucinations. PHQ9=11. No other issues or complaints.

## 2020-09-04 NOTE — Progress Notes (Signed)
Virtual Visit via Video Note  I connected with Kristen Padilla on 09/04/20 at  9:00 AM EST by a video enabled telemedicine application and verified that I am speaking with the correct person using two identifiers.  Location: Patient: Home Provider: Office   I discussed the limitations of evaluation and management by telemedicine and the availability of in person appointments. The patient expressed understanding and agreed to proceed.  I discussed the assessment and treatment plan with the patient. The patient was provided an opportunity to ask questions and all were answered. The patient agreed with the plan and demonstrated an understanding of the instructions.   The patient was advised to call back or seek an in-person evaluation if the symptoms worsen or if the condition fails to improve as anticipated.  I provided 15 minutes of non-face-to-face time during this encounter.   Oneta Rack, NP   [facility] MD/PA/NP OP Progress Note  09/04/2020 12:06 PM Kristen Padilla  MRN:  [id number]  Evaluation: Kristen Padilla was seen and evaluated via WebEx.  She reports continued depression rating her depression 8 out of 10 with 10 being the worst.  States she was recently changed from Zoloft 50 mg to Wellbutrin 150 mg daily.  Discussed titration to Wellbutrin.  Patient reports she has a follow-up appointment with her outpatient provider this Wednesday.  And would like to discuss medication options at that point.  NP offered to increase hydroxyzine from 10 mg to 25 mg daily.  Patient to keep follow-up appointment with Dr. Doyne Keel for medication management.  Reports a good appetite.  States she is resting well throughout the night.  Patient to continue partial hospitalization programming Support, encouragement and reassurance was provided.   Visit Diagnosis: No diagnosis found.  Past Psychiatric History:   Past Medical History:  Past Medical History:  Diagnosis Date  . Depression    . Miscarriage   . Morbid obesity (HCC)     Past Surgical History:  Procedure Laterality Date  . HERNIA REPAIR    . Repair of displaced fracture of the left radius  03/2020    Family Psychiatric History:   Family History:  Family History  Problem Relation Age of Onset  . Diabetes Mother   . Hypertension Mother   . Depression Mother   . Hypertension Father     Social History:  Social History   Socioeconomic History  . Marital status: Married    Spouse name: Not on file  . Number of children: 1  . Years of education: Not on file  . Highest education level: High school graduate  Occupational History  . Not on file  Tobacco Use  . Smoking status: Never Smoker  . Smokeless tobacco: Never Used  Vaping Use  . Vaping Use: Never used  Substance and Sexual Activity  . Alcohol use: No  . Drug use: No  . Sexual activity: Yes    Birth control/protection: None  Other Topics Concern  . Not on file  Social History Narrative  . Not on file   Social Determinants of Health   Financial Resource Strain: Low Risk   . Difficulty of Paying Living Expenses: Not very hard  Food Insecurity: No Food Insecurity  . Worried About Programme researcher, broadcasting/film/video in the Last Year: Never true  . Ran Out of Food in the Last Year: Never true  Transportation Needs: No Transportation Needs  . Lack of Transportation (Medical): No  . Lack of Transportation (Non-Medical): No  Physical Activity:  Insufficiently Active  . Days of Exercise per Week: 2 days  . Minutes of Exercise per Session: 50 min  Stress: Stress Concern Present  . Feeling of Stress : Very much  Social Connections: Socially Isolated  . Frequency of Communication with Friends and Family: Once a week  . Frequency of Social Gatherings with Friends and Family: Once a week  . Attends Religious Services: Never  . Active Member of Clubs or Organizations: No  . Attends Banker Meetings: Never  . Marital Status: Married     Allergies:  Allergies  Allergen Reactions  . Aripiprazole Rash  . Sulfa Antibiotics Rash    Metabolic Disorder Labs: No results found for: HGBA1C, MPG No results found for: PROLACTIN No results found for: CHOL, TRIG, HDL, CHOLHDL, VLDL, LDLCALC No results found for: TSH  Therapeutic Level Labs: No results found for: LITHIUM No results found for: VALPROATE No components found for:  CBMZ  Current Medications: Current Outpatient Medications  Medication Sig Dispense Refill  . buPROPion (WELLBUTRIN XL) 150 MG 24 hr tablet Take 1 tablet (150 mg total) by mouth every morning. 30 tablet 2  . hydrOXYzine (ATARAX/VISTARIL) 10 MG tablet Take 1 tablet (10 mg total) by mouth 3 (three) times daily as needed. 90 tablet 2   No current facility-administered medications for this visit.     Musculoskeletal: Strength & Muscle Tone: within normal limits Gait & Station: normal Patient leans: N/A  Psychiatric Specialty Exam: Review of Systems  There were no vitals taken for this visit.There is no height or weight on file to calculate BMI.  General Appearance: Casual  Eye Contact:  Good  Speech:  Clear and Coherent  Volume:  Normal  Mood:  Anxious and Depressed  Affect:  Congruent  Thought Process:  Coherent  Orientation:  Full (Time, Place, and Person)  Thought Content: Logical   Suicidal Thoughts:  No  Homicidal Thoughts:  No  Memory:  Immediate;   Fair Recent;   Fair  Judgement:  Fair  Insight:  Fair  Psychomotor Activity:  Normal  Concentration:  Concentration: Fair  Recall:  [employer] of Knowledge: Fair  Language: Fair  Akathisia:  No  Handed:  Right  AIMS (if indicated):   Assets:  Communication Skills Desire for Improvement Resilience Social Support  ADL's:  Intact  Cognition: WNL  Sleep:  Fair   Screenings: GAD-7   Flowsheet Row Clinical Support from 06/12/2020 in [facility]  Total GAD-7 Score 11    PHQ2-9   Flowsheet Row  Counselor from 09/04/2020 in [facility] Counselor from 08/10/2020 in [facility] Clinical Support from 06/12/2020 in [facility] Counselor from 06/05/2020 in [place]  PHQ-2 Total Score 4 3 3 5   PHQ-9 Total Score 11 11 17 16     Flowsheet Row Counselor from 09/04/2020 in [facility] Counselor from 08/10/2020 in [facility] Counselor from 06/05/2020 in [facility]  C-SSRS RISK CATEGORY Error: Q7 should not be populated when Q6 is No High Risk Error: Question 6 not populated       Assessment and Plan:  Continue Partial Hospitalization Program at [facility] -Consider titration to Wellbutrin and hydroxyzine.  Treatment plan was reviewed and agreed upon by NP [facility] and patient Kristen Padilla need for continued group services   Melvyn Neth, NP 09/04/2020, 12:06 PM

## 2020-09-04 NOTE — Psych (Addendum)
[facility] BH PHP THERAPIST PROGRESS NOTE  Kristen Padilla  [id number]  Virtual Visit via Video Note  I connected withChristina LOUANN Padilla on 09/04/20 at 9:00 AM EST by a video enabled telemedicine application and verified that I am speaking with the correct person using two identifiers.  Location: Patient: Home Provider: Office  Patient participated in daily group therapy session.    Session Time: 9:00am-10:00am  Participation Level: Active  Behavioral Response: CasualAlertAnxious  Type of Therapy: Group Therapy  Treatment Goals addressed: Coping  Interventions: CBT, DBT, Solution Focused, Strength-based and Supportive  Summary: Kristen Padilla is a 30 y.o. female who presents with Depression and GAD.   Suicidal/Homicidal: Nowithout intent/plan  Therapist Response: Reports she is good this morning. She shared that she went to her niece's 68th year old birthday party. Kristen Padilla rated her mood at a 5 this morning. Denied having any SI, HI, AVH this morning. Stigmas - Reports she cannot think of any incidents where she experienced stigma regarding her mental health issues.    Diagnosis: Current moderate episode of major depressive disorder without prior episode (HCC) [F32.1]    1. Current moderate episode of major depressive disorder without prior episode (HCC)   2. GAD (generalized anxiety disorder)     Session Time: 10:00am - 11:00am - Discussing Stigmas and Identifying Triggers   Participation Level: Active  Behavioral Response: CasualAlertAnxious  Type of Therapy: Group Therapy  Treatment Goals addressed: Coping  Interventions: CBT, DBT, Solution Focused and Strength-based  Summary: This group will allow patients to explore their thoughts and feelings about diagnoses they have received. Patients will be guided to explore their level of understanding and acceptance of these diagnoses. Facilitator will encourage patients to  process their thoughts and feelings about the reactions of others to their diagnosis and will guide patients in identifying ways to discuss their diagnosis with significant others in their lives. This group will be process-oriented, with patients participating in exploration of their own experiences, giving, and receiving support, and processing challenge from other group members.   Therapist Response: Shared that her mother is a trigger for her She reports her mother only contacts her when she wants to discuss trauma. She states that her mother was not there for her, however raised someone else's child. States that she reconnected with her mother when she was 23 years old. She shared her mother left when she was 5yo due to substance use issues. She became cleaned when she was 30yo, however did not attempt to reconnect with Kristen Padilla. Patient identified recent triggers and what behaviors and actions once they felt triggered. The patient also discussed the consequences for their triggers and how that has had an impact in their life currently.     Session Time: 11:00a- 12:00pm - Occupational Therapy    Participation Level: Active  Behavioral Response: CasualAlertAnxious  Type of Therapy: Group Therapy  Treatment Goals addressed: Coping  Interventions: CBT, DBT, Solution Focused and Strength-based  Summary: Group session encouraged increased participation and engagement through discussion focused on worry and our circle of control. Group reviewed a PowerPoint that discussed healthy vs unhealthy worry with specific examples provided. Discussion also focused on utilizing the circle of control outline to identify what is within our control, what we have influence on, and what is not in our control. Group members shared specific examples and worries and identified what categories they fell in within the circle of control.   Therapist Response:  Patient was present through duration of group/moderate  participation     Session Time: 12:00- 12:50pm - Radical Acceptance   Participation Level: Active  Behavioral Response: CasualAlertAnxious  Type of Therapy: Group Therapy  Treatment Goals addressed: Coping  Interventions: CBT, DBT, Solution Focused, Strength-based, Supportive and Reframing  Summary: Identified and learned how to practice radical acceptance skills.   Therapist Response: Shared that she could radically accept the relationship she has with her mother, however she feels like if she continues to have the mentality of "it is what it is". Whenever I try to tell my mother how I feel, she can't accept what she's done to me. I've learned that my mom is never going to be there. Patient reflected on past or current stressful situations that they could radically accept. Kristen Padilla states she is not sure what will assist her in radically accepting her strained relationship with mom.     Session Time: 12:50 - 1:00pm - Check Out   Therapist Response: Feeling good. Rated her mood at a 7 at the end of group. Reports she does not have any plans this afternoon. Denies any SI, HI, AVH.     Kristen Sarah, LCSW 09/04/2020

## 2020-09-05 ENCOUNTER — Ambulatory Visit (INDEPENDENT_AMBULATORY_CARE_PROVIDER_SITE_OTHER): Payer: No Payment, Other | Admitting: Licensed Clinical Social Worker

## 2020-09-05 DIAGNOSIS — F411 Generalized anxiety disorder: Secondary | ICD-10-CM

## 2020-09-05 DIAGNOSIS — F321 Major depressive disorder, single episode, moderate: Secondary | ICD-10-CM

## 2020-09-05 NOTE — Psych (Signed)
[facility] BH PHP THERAPIST PROGRESS NOTE  Kristen Padilla  [id number]  Virtual Visit via Video Note  I connected withChristina LOUANN Padilla on 09/05/20 at 9:00 AM EST by a video enabled telemedicine application and verified that I am speaking with the correct person using two identifiers.  Location: Patient: Home Provider: Office  Patient participated in daily group therapy session.    Session Time: 9:00am-10:00am  Participation Level: Active  Behavioral Response: CasualAlertAnxious  Type of Therapy: Group Therapy  Treatment Goals addressed: Coping  Interventions: CBT, DBT, Solution Focused, Strength-based and Supportive  Summary: Kristen Padilla is a 30 y.o. female who presents with Depression and GAD.   Therapist Response: Reports she is feeling good this morning. States that she plans on attending her appointment tomorrow with Kristen Padilla for medication management. She rated her mood at a 7 this morning. Denied any SI, HI, AVH.   Suicidal/Homicidal: Nowithout intent/plan    Diagnosis: No primary diagnosis found.    No diagnosis found.  Session Time: 10:00am - 11:00am - Cognitive Distortions and Anxiety   Participation Level: Active  Behavioral Response: CasualAlertAnxious  Type of Therapy: Group Therapy  Treatment Goals addressed: Coping  Interventions: CBT, DBT, Solution Focused and Strength-based  Summary: Client reviewed and processed their ability to challenge and change irrational thoughts.   Therapist Response: Patient was engaged and participated throughout the group session. Kristen Padilla reports she struggles with Jumping to Conclusions regarding her relationship with her brother. She states that they have never got along, and when she comes around, she assumes he does not want her around. She reports her brother has never expressed that he did not want her around, however she removes herself from the situation due  to a "feeling". Regarding anxiety associated with having cognitive distortions she reports that she becomes anxious due to her fear of getting close with people. She reports her anxiety also causes a strain on her marriage. She states that although her husband reassures her every day, she continues to "jump to conclusion". She states that feels like this is due to her traumatic childhood experienced with her abandoning her.      Session Time: 11:00a- 12:00pm - Occupational Therapy    Participation Level: Active  Behavioral Response: CasualAlertAnxious  Type of Therapy: Group Therapy  Treatment Goals addressed: Coping  Interventions: CBT, DBT, Solution Focused and Strength-based  Summary: Group session encouraged increased participation and engagement through discussion focused on worry and our circle of control. Group reviewed a PowerPoint that discussed healthy vs unhealthy worry with specific examples provided. Discussion also focused on utilizing the circle of control outline to identify what is within our control, what we have influence on, and what is not in our control. Group members shared specific examples and worries and identified what categories they fell in within the circle of control.   Therapist Response:  Patient was present through duration of group/moderate participation     Session Time: 12:00- 12:50pm - CBT Skill: Challenging Negative Thoughts   Participation Level: Active  Behavioral Response: CasualAlertAnxious  Type of Therapy: Group Therapy  Treatment Goals addressed: Coping  Interventions: CBT, DBT, Solution Focused, Strength-based, Supportive and Reframing  Summary:   Therapist Response: Patient's reviewed cognitive behavioral skill of challenging negative thought patterns and processing their findings with clinician. Patient completed a Challenging Negative Thoughts worksheet. Patients ended group session by watching Ted Talk Video - Getting Stuck in  The Negatives (And How To Get Unstuck) by Lajean Saver.  Session Time: 12:50 - 1:00pm - Check Out   Therapist Response: Patient was absent during this group session time.      [facility]-PHP THERAPIST 09/05/2020

## 2020-09-06 ENCOUNTER — Ambulatory Visit (HOSPITAL_COMMUNITY): Payer: Self-pay

## 2020-09-06 NOTE — Progress Notes (Signed)
Patient did not attend today's group session due to a misunderstanding. The patient was scheduled for a medication management appointment with [name], NP. The patient was under the impression her appointment was today, which would require for her to be absent from the group's session, however she learned that the appointment is scheduled for Thursday.   Patient contacted this Clinical research associate via phone and notified me of the situation. CSW expressed understanding and informed the patient to attend her medication management appointment and to return to group the following day. The patient expressed understanding and gratitude.   CSW will continue to follow.      Baldo Daub, MSW, LCSW Clinical Child psychotherapist (Facility Based Crisis) [facility]

## 2020-09-07 ENCOUNTER — Other Ambulatory Visit (HOSPITAL_COMMUNITY): Payer: Self-pay | Admitting: Psychiatry

## 2020-09-07 ENCOUNTER — Encounter (HOSPITAL_COMMUNITY): Payer: Self-pay | Admitting: Psychiatry

## 2020-09-07 ENCOUNTER — Other Ambulatory Visit: Payer: Self-pay

## 2020-09-07 ENCOUNTER — Ambulatory Visit (INDEPENDENT_AMBULATORY_CARE_PROVIDER_SITE_OTHER): Payer: No Payment, Other | Admitting: Psychiatry

## 2020-09-07 DIAGNOSIS — F330 Major depressive disorder, recurrent, mild: Secondary | ICD-10-CM

## 2020-09-07 DIAGNOSIS — F411 Generalized anxiety disorder: Secondary | ICD-10-CM

## 2020-09-07 MED ORDER — BUPROPION HCL ER (XL) 300 MG PO TB24: 300.0000 mg | ORAL_TABLET | ORAL | 2 refills | Status: DC

## 2020-09-07 MED ORDER — BUSPIRONE HCL 10 MG PO TABS: 10.0000 mg | ORAL_TABLET | Freq: Three times a day (TID) | ORAL | 2 refills | Status: DC

## 2020-09-07 NOTE — Progress Notes (Signed)
BH MD/PA/NP OP Progress Note  09/07/2020 12:29 PM Kristen Padilla  MRN:  [id number]  Chief Complaint: "I feel like the medications worked for a mouth"  Chief Complaint    Medication Management      HPI: 30 year old female seen today for follow up psychiatric evaluation.   She has a psychiatric history of anxiety, depression, and SI.  She is currently managed on Wellbutrin XL 150 mg and hydroxyzine 10 mg three times daily.  She informed Clinical research associate that her medications are somewhat effective in managing her psychiatric conditions.  Today she is well-groomed, pleasant, cooperative, engaged in conversation, and maintained eye contact.  She informed provider that her depression has somewhat improved but notes that her anxiety has not. She informed provider that during the first month of taking Wellbutrin she was not as depressed, however noted that after a month her depression worsened.  Today provider conducted a PHQ-9 and patient scored an 11, at her last visit she scored a 17.  Provider also conducted a GAD-7 and patient scored an 11, at last visit she scored an 11.  Today she denies SI/HI/AVH or paranoia.  Patient informed provider that she feels as though something is wrong with her. She notes that her mother avoids her which bothers her. Patient notes that she continues to communicate with her 27 year old daughters adoptive family which makes her feel good.   Patient is agreeable to increase Wellbutrin 150 mg daily to 300 mg daily help manage symptoms of depression.  She notes that she would like to discontinue hydroxyzine because it is ineffective.  She is agreeable to start Buspar 10 mg three times daily to help manage anxiety and depression. Potential side effects of medication and risks vs benefits of treatment vs non-treatment were explained and discussed. All questions were answered.  She will continue all other medications as prescribed.  No other concerns noted at this  time.  Visit Diagnosis:    ICD-10-CM   1. Mild episode of recurrent major depressive disorder (HCC)  F33.0 buPROPion (WELLBUTRIN XL) 300 MG 24 hr tablet    busPIRone (BUSPAR) 10 MG tablet  2. GAD (generalized anxiety disorder)  F41.1 busPIRone (BUSPAR) 10 MG tablet    Past Psychiatric History: anxiety, depression and suicidal ideation.  Past Medical History:  Past Medical History:  Diagnosis Date  . Depression   . Miscarriage   . Morbid obesity (HCC)     Past Surgical History:  Procedure Laterality Date  . HERNIA REPAIR    . Repair of displaced fracture of the left radius  03/2020    Family Psychiatric History: Mother depression   Family History:  Family History  Problem Relation Age of Onset  . Diabetes Mother   . Hypertension Mother   . Depression Mother   . Hypertension Father     Social History:  Social History   Socioeconomic History  . Marital status: Married    Spouse name: Not on file  . Number of children: 1  . Years of education: Not on file  . Highest education level: High school graduate  Occupational History  . Not on file  Tobacco Use  . Smoking status: Never Smoker  . Smokeless tobacco: Never Used  Vaping Use  . Vaping Use: Never used  Substance and Sexual Activity  . Alcohol use: No  . Drug use: No  . Sexual activity: Yes    Birth control/protection: None  Other Topics Concern  . Not on file  Social  History Narrative  . Not on file   Social Determinants of Health   Financial Resource Strain: Low Risk   . Difficulty of Paying Living Expenses: Not very hard  Food Insecurity: No Food Insecurity  . Worried About Programme researcher, broadcasting/film/video in the Last Year: Never true  . Ran Out of Food in the Last Year: Never true  Transportation Needs: No Transportation Needs  . Lack of Transportation (Medical): No  . Lack of Transportation (Non-Medical): No  Physical Activity: Insufficiently Active  . Days of Exercise per Week: 2 days  . Minutes of  Exercise per Session: 50 min  Stress: Stress Concern Present  . Feeling of Stress : Very much  Social Connections: Socially Isolated  . Frequency of Communication with Friends and Family: Once a week  . Frequency of Social Gatherings with Friends and Family: Once a week  . Attends Religious Services: Never  . Active Member of Clubs or Organizations: No  . Attends Banker Meetings: Never  . Marital Status: Married    Allergies:  Allergies  Allergen Reactions  . Aripiprazole Rash  . Sulfa Antibiotics Rash    Metabolic Disorder Labs: No results found for: HGBA1C, MPG No results found for: PROLACTIN No results found for: CHOL, TRIG, HDL, CHOLHDL, VLDL, LDLCALC No results found for: TSH  Therapeutic Level Labs: No results found for: LITHIUM No results found for: VALPROATE No components found for:  CBMZ  Current Medications: Current Outpatient Medications  Medication Sig Dispense Refill  . busPIRone (BUSPAR) 10 MG tablet Take 1 tablet (10 mg total) by mouth 3 (three) times daily. 90 tablet 2  . buPROPion (WELLBUTRIN XL) 300 MG 24 hr tablet Take 1 tablet (300 mg total) by mouth every morning. 30 tablet 2   No current facility-administered medications for this visit.     Musculoskeletal: Strength & Muscle Tone: within normal limits Gait & Station: normal Patient leans: N/A  Psychiatric Specialty Exam: Review of Systems  Blood pressure 126/75, pulse 94, height 5\' 2"  (1.575 m), weight 266 lb 6.4 oz (120.8 kg), SpO2 99 %.Body mass index is 48.73 kg/m.  General Appearance: Well Groomed  Eye Contact:  Good  Speech:  Clear and Coherent and Normal Rate  Volume:  Normal  Mood:  Anxious and Depressed  Affect:  Appropriate and Congruent  Thought Process:  Coherent, Goal Directed and Linear  Orientation:  Full (Time, Place, and Person)  Thought Content: WDL and Logical   Suicidal Thoughts:  No  Homicidal Thoughts:  No  Memory:  Immediate;   Good Recent;    Good Remote;   Good  Judgement:  Good  Insight:  Good  Psychomotor Activity:  Normal  Concentration:  Concentration: Good and Attention Span: Good  Recall:  Good  Fund of Knowledge: Good  Language: Good  Akathisia:  No  Handed:  Right  AIMS (if indicated): Not done  Assets:  Communication Skills Desire for Improvement Financial Resources/Insurance Housing Intimacy Social Support  ADL's:  Intact  Cognition: WNL  Sleep:  Good   Screenings: GAD-7   Flowsheet Row Clinical Support from 09/07/2020 in [facility] Clinical Support from 06/12/2020 in [facility]  Total GAD-7 Score 11 11    PHQ2-9   Flowsheet Row Clinical Support from 09/07/2020 in [facility] Counselor from 09/04/2020 in [facility] Counselor from 08/10/2020 in [facility] Clinical Support from 06/12/2020 in [facility] Counselor from 06/05/2020 in [facility]  PHQ-2 Total Score 3 4 3 3 5   PHQ-9 Total Score 7 11 11 17 16     Flowsheet Row Clinical Support from 09/07/2020 in [facility] Counselor from 09/04/2020 in [facility] Counselor from 08/10/2020 in [facility]  C-SSRS RISK CATEGORY No Risk Error: Q7 should not be populated when Q6 is No High Risk       Assessment and Plan: Patient endorses symptoms of anxiety and depression. Today she is agreeable to increase Wellbutrin 150 mg daily to 300 mg daily help manage symptoms of depression.  She notes that she would like to discontinue hydroxyzine because it is ineffective.  She is agreeable to start Buspar 10 mg three times daily to help manage anxiety and depression  1. Mild episode of recurrent major depressive disorder (HCC)  Increased- buPROPion (WELLBUTRIN XL) 300 MG 24 hr tablet; Take 1  tablet (300 mg total) by mouth every morning.  Dispense: 30 tablet; Refill: 2 Start- busPIRone (BUSPAR) 10 MG tablet; Take 1 tablet (10 mg total) by mouth 3 (three) times daily.  Dispense: 90 tablet; Refill: 2  2. GAD (generalized anxiety disorder)  Start- busPIRone (BUSPAR) 10 MG tablet; Take 1 tablet (10 mg total) by mouth 3 (three) times daily.  Dispense: 90 tablet; Refill: 2  Follow up in 3 months Follow up with therapy.  10/08/2020, NP 09/07/2020, 12:29 PM

## 2020-09-08 ENCOUNTER — Ambulatory Visit (INDEPENDENT_AMBULATORY_CARE_PROVIDER_SITE_OTHER): Payer: No Payment, Other | Admitting: Licensed Clinical Social Worker

## 2020-09-08 DIAGNOSIS — F332 Major depressive disorder, recurrent severe without psychotic features: Secondary | ICD-10-CM | POA: Diagnosis not present

## 2020-09-08 DIAGNOSIS — F411 Generalized anxiety disorder: Secondary | ICD-10-CM

## 2020-09-08 NOTE — Progress Notes (Addendum)
Patient presented to group at an appropriate time. Patient expressed concerns regarding her being the only member in group today. CSW explained that new members should join on Monday, 09/11/20.   CSW explained that a "Person Centered Plan" was needed to be completed. Patient expressed understanding and was willing to participate in completing.   Patient reports she would feel more comfortable returning to group session on Monday 09/11/20.   CSW expressed that the group material could be reviewed and processed with the patient today, however she continued to prefer to return Monday.   CSW will continue to follow patient on Monday, 09/11/20.    Baldo Daub, MSW, LCSW Clinical Child psychotherapist (Facility Based Crisis) [facility]

## 2020-09-11 ENCOUNTER — Ambulatory Visit (INDEPENDENT_AMBULATORY_CARE_PROVIDER_SITE_OTHER): Payer: No Payment, Other | Admitting: Licensed Clinical Social Worker

## 2020-09-11 ENCOUNTER — Other Ambulatory Visit: Payer: Self-pay

## 2020-09-11 ENCOUNTER — Encounter (HOSPITAL_COMMUNITY): Payer: Self-pay | Admitting: Licensed Clinical Social Worker

## 2020-09-11 DIAGNOSIS — F322 Major depressive disorder, single episode, severe without psychotic features: Secondary | ICD-10-CM

## 2020-09-11 NOTE — Progress Notes (Addendum)
I reviewed chart and agreed with the findings and treatment Plan of the patient.  Kristen Sharper, MD    Virtual Visit via Video Note  I connected with Kristen Padilla on 09/11/20 at  9:00 AM EDT by a video enabled telemedicine application and verified that I am speaking with the correct person using two identifiers.  Location: Patient: Home Provider: Office   I discussed the limitations of evaluation and management by telemedicine and the availability of in person appointments. The patient expressed understanding and agreed to proceed.   I discussed the assessment and treatment plan with the patient. The patient was provided an opportunity to ask questions and all were answered. The patient agreed with the plan and demonstrated an understanding of the instructions.   The patient was advised to call back or seek an in-person evaluation if the symptoms worsen or if the condition fails to improve as anticipated.  I provided 15 minutes of non-face-to-face time during this encounter.   Kristen Rack, NP   [facility] MD/PA/NP OP Progress NoteMordecai Padilla   09/11/2020 2:46 PM Kristen Padilla  MRN:  [id number]  Chief Complaint:   Kristen Padilla stated she was recently seen and and evaluated via video assessment.  She reports a recent medication adjustment.  States her provider increase Wellbutrin 150 to 300 mg and she was initiated on BuSpar 10 mg p.o. 3 times daily.  Patient reports worsening mood irritability, which she attributes to BuSpar.  States she feels oversedated will hydroxyzine 10 mg.  Patient is seeking additional medications for anxiety.  Discussed decreasing or discontinuing medication.  Patient was receptive to plan.  Patient to keep follow-up appointments with outpatient provider.  She denies suicidal or homicidal ideations.  Denies auditory or visual hallucinations.  Support, encouragement and  reassurance was provided   HPI:- Per admission assessment note:  Kristen Padilla is a 30 y.o. female with medical history significant of depression and morbid obesity presents after trying to harm herself by taking anywhere from 20-30 500 mg tablets of Tylenol between 12-1 AM this morning and a bottle of NyQuil.  Patient notes that she has had a lot of stress and been very depressed lately.  acetaminophen level 63->150, and salicylate level less than 7.  UDS was negative.Poison control had been contacted and it was recommended to initiate Mucomyst due to the rising acetaminophen levels. Visit Diagnosis:    ICD-10-CM   1. Current severe episode of major depressive disorder without psychotic features without prior episode (HCC)  F32.2     Past Psychiatric History:   Past Medical History:  Past Medical History:  Diagnosis Date   Depression    Miscarriage    Morbid obesity (HCC)     Past Surgical History:  Procedure Laterality Date   HERNIA REPAIR     Repair of displaced fracture of the left radius  03/2020    Family Psychiatric History:   Family History:  Family History  Problem Relation Age of Onset   Diabetes Mother    Hypertension Mother    Depression Mother    Hypertension Father     Social History:  Social History   Socioeconomic History   Marital status: Married    Spouse name: Not on file   Number of children: 1   Years of education: Not on file   Highest education level: High school graduate  Occupational History   Not on file  Tobacco Use   Smoking status: Never Smoker   Smokeless  tobacco: Never Used  Vaping Use   Vaping Use: Never used  Substance and Sexual Activity   Alcohol use: No   Drug use: No   Sexual activity: Yes    Birth control/protection: None  Other Topics Concern   Not on file  Social History Narrative   Not on file   Social Determinants of Health   Financial Resource Strain: Low Risk    Difficulty of Paying Living Expenses: Not very hard  Food Insecurity: No Food Insecurity   Worried About  Programme researcher, broadcasting/film/video in the Last Year: Never true   Ran Out of Food in the Last Year: Never true  Transportation Needs: No Transportation Needs   Lack of Transportation (Medical): No   Lack of Transportation (Non-Medical): No  Physical Activity: Insufficiently Active   Days of Exercise per Week: 2 days   Minutes of Exercise per Session: 50 min  Stress: Stress Concern Present   Feeling of Stress : Very much  Social Connections: Socially Isolated   Frequency of Communication with Friends and Family: Once a week   Frequency of Social Gatherings with Friends and Family: Once a week   Attends Religious Services: Never   Database administrator or Organizations: No   Attends Banker Meetings: Never   Marital Status: Married    Allergies:  Allergies  Allergen Reactions   Aripiprazole Rash   Sulfa Antibiotics Rash    Metabolic Disorder Labs: No results found for: HGBA1C, MPG No results found for: PROLACTIN No results found for: CHOL, TRIG, HDL, CHOLHDL, VLDL, LDLCALC No results found for: TSH  Therapeutic Level Labs: No results found for: LITHIUM No results found for: VALPROATE No components found for:  CBMZ  Current Medications: Current Outpatient Medications  Medication Sig Dispense Refill   buPROPion (WELLBUTRIN XL) 300 MG 24 hr tablet TAKE 1 TABLET (300 MG TOTAL) BY MOUTH EVERY MORNING. 90 tablet 1   busPIRone (BUSPAR) 10 MG tablet TAKE 1 TABLET BY MOUTH THREE TIMES A DAY (Patient not taking: Reported on 09/11/2020) 270 tablet 1   No current facility-administered medications for this visit.     Musculoskeletal:   Psychiatric Specialty Exam: Review of Systems  There were no vitals taken for this visit.There is no height or weight on file to calculate BMI.  General Appearance: Casual  Eye Contact:  Good  Speech:  Clear and Coherent  Volume:  Normal  Mood:  Anxious and Depressed  Affect:  Congruent  Thought Process:  Coherent  Orientation:  Full (Time,  Place, and Person)  Thought Content: Logical   Suicidal Thoughts:  No  Homicidal Thoughts:  No  Memory:  Immediate;   Fair Recent;   Fair  Judgement:  Fair  Insight:  Fair  Psychomotor Activity:  Normal  Concentration:  Concentration: Good  Recall:  Good  Fund of Knowledge: Good  Language: Good  Akathisia:  No  Handed:  Right  AIMS (if indicated):   Assets:  Communication Skills Desire for Improvement Resilience Social Support  ADL's:  Intact  Cognition: WNL  Sleep:  Fair   Screenings: GAD-7    Flowsheet Row Clinical Support from 09/07/2020 in [facility] Clinical Support from 06/12/2020 in [facility]  Total GAD-7 Score 11 11      PHQ2-9    Flowsheet Row Clinical Support from 09/07/2020 in [facility] Counselor from 09/04/2020 in [facility] Counselor from 08/10/2020 in  [facility] Clinical Support from 06/12/2020 in [facility] Counselor from 06/05/2020 in [facility]  PHQ-2 Total Score 3 4 3 3 5   PHQ-9 Total Score 7 11 11 17 16       Flowsheet Row Clinical Support from 09/07/2020 in [facility] Counselor from 09/04/2020 in [facility] Counselor from 08/10/2020 in [facility]  C-SSRS RISK CATEGORY No Risk Error: Q7 should not be populated when Q6 is No High Risk        Assessment and Plan:  Patient to continue partial hospitalization programming [facility] urgent care facility -Continue Wellbutrin 300 mg p.o. daily for depression and anxiety -Discontinue BuSpar 10 mg due to reported mood irritability and worsening depression -Continue hydroxyzine 10 mg p.o. as needed.,  Education was provided with sedation and patient to take medication when less active throughout the day -Anticipated  discharge 09/15/2020  Treatment plan was reviewed and agreed upon by NP [facility] and patient [address] need for continued group services.   Melvyn Neth, NP 09/11/2020, 2:46 PM

## 2020-09-11 NOTE — Progress Notes (Signed)
Spoke with patient via Webex video call, used 2 identifiers to correctly identify patient. States groups are getting better but still finds them "a little awkward." She had her Wellbutrin increased and started Buspar but became irritable over the weekend. She stopped her Buspar today at the advice of her psychiatrist. On scale 1-10 as 10 being worst she rates depression at 7 and anxiety at 5. Denies SI/HI and AV hallucinations. No other issues or complaints.

## 2020-09-11 NOTE — Psych (Signed)
[facility] BH PHP THERAPIST PROGRESS NOTE  Kristen Padilla  [id number]  Virtual Visit via Video Note  I connected withChristina LOUANN Padilla on 09/05/20 at 9:00 AM EST by a video enabled telemedicine application and verified that I am speaking with the correct person using two identifiers.  Location: Patient: Home Provider: Office  Patient participated in daily group therapy session.    Session Time: 9:00am-10:00am  Participation Level: Active  Behavioral Response: CasualAlertAnxious  Type of Therapy: Group Therapy  Treatment Goals addressed: Coping  Interventions: CBT, DBT, Solution Focused, Strength-based and Supportive  Summary: Kristen Padilla is a 30 y.o. female who presents with Depression and GAD.   Therapist Response: Reports she is feeling good this morning. She states that she did not do anything interesting this weekend. Reports she does not like the new anxiety medications she was switched on and reports the new dosage of Kristen Padilla is not beneficial, she states she believes it causes her to become more irritable. Kristen Padilla rated her mood at a 4. Denies any SI, HI, AVH.   Suicidal/Homicidal: Nowithout intent/plan    Diagnosis: No primary diagnosis found.    No diagnosis found.  Session Time: 10:00am - 11:00am - Positive Psychology Techniques   Participation Level: Active  Behavioral Response: CasualAlertAnxious  Type of Therapy: Group Therapy  Treatment Goals addressed: Coping  Interventions: CBT, DBT, Solution Focused and Strength-based  Summary: Patient's reviewed positive psychology and its various techniques on how to maintain a healthy level of "happiness".   Therapist Response:  Kristen Padilla reports she was unaware of what positive psychology was, however, she felt like it was something that could be beneficial. Kristen Padilla learned how to divert the attention of recovery from eliminating sadness, to focusing on  increasing happiness and well-being. Kristen Padilla reviewed the three dimensions of Positive Psychology and how it contributes to a person's sense of well-being.   The Pleasant Life - I think negative all the time. States she believes her childhood traumas and everyday life stressors. Reports she was influenced by both parents regarding her lack of managing positive emotions.   The Good Life - Shared that she lacks in this dimension due to her not wanting to interact with others. Shared that her daughter was adopted when she was 80 years old due to [place] struggling with postpartum depression.   The Meaningful Life - a goal      Session Time: 11:00a- 12:00pm - Exploring Goals, Values & Strengths  Participation Level: Active  Behavioral Response: CasualAlertAnxious  Type of Therapy: Group Therapy  Treatment Goals addressed: Coping  Interventions: CBT, DBT, Solution Focused and Strength-based  Summary: Patient's reviewed the purpose of goal exploration to assist in giving them direction to life by highlighting your values and finding a sense of purpose. These goal setting skills assist patient's in evaluating their ability to accomplish overall wellness.   Therapist Response: Kristen Padilla identified the following goals:   Social Goals:  - 5 year goal: Getting out of the house and becoming more social outside of my home.  - 1 year goal: I want to be in a different place, mentally in regards  - 1 month goal:  I want my anxiety to be less frequent    Family Goals  - 5 year:  I want me and my brother to have a better relationship. Also hoping for relationships with mother to improve.  - 1 year: Reaching out to my mother more often.  1 month: None at this time.  Session Time: 12:00- 12:50pm - Overall Wellness   Participation Level: Active  Behavioral Response: CasualAlertAnxious  Type of Therapy: Group Therapy  Treatment Goals addressed: Coping  Interventions: CBT, DBT,  Solution Focused, Strength-based, Supportive and Reframing  Summary:   Therapist Response: Kristen Padilla reports that she struggles in the following categories of wellness: Emotional, Physical and Social. Kristen Padilla reports that she plans to utilize positive psychology techniques in addition to goal and task setting to address how to improve wellness in each category.   Session Time: 12:50 - 1:00pm - Check Out   Therapist Response: Reports she is feeling good after group. Kristen Padilla rated her mood at a 5. She denied any SI, HI, AVH. Chrinstina reports she will attend group tomorrow morning.      [facility]-PHP THERAPIST 09/11/2020

## 2020-09-12 ENCOUNTER — Ambulatory Visit (INDEPENDENT_AMBULATORY_CARE_PROVIDER_SITE_OTHER): Payer: No Payment, Other | Admitting: Licensed Clinical Social Worker

## 2020-09-12 DIAGNOSIS — F322 Major depressive disorder, single episode, severe without psychotic features: Secondary | ICD-10-CM | POA: Diagnosis not present

## 2020-09-12 NOTE — Psych (Signed)
   [facility] BH PHP THERAPIST PROGRESS NOTE  Kristen Padilla  [id number]  Virtual Visit via Video Note  I connected withChristina LOUANN Padilla on 09/05/20 at 9:00 AM EST by a video enabled telemedicine application and verified that I am speaking with the correct person using two identifiers.  Location: Patient: Home Provider: Office  Patient participated in daily group therapy session.    Session Time: 9:00am-10:00am  Participation Level: Active  Behavioral Response: CasualAlertAnxious  Type of Therapy: Group Therapy  Treatment Goals addressed: Coping  Interventions: CBT, DBT, Solution Focused, Strength-based and Supportive  Summary: Kristen Padilla is a 30 y.o. female who presents with Depression and GAD.   Therapist Response: Reports she is doing "okay". States she did not do anything after group yesterday. Kristen Padilla rated her mood at a 3. She shared that yesterday after her group yesterday, her mother messaged her on Facebook that resulted into an argument.   Suicidal/Homicidal: Nowithout intent/plan Diagnosis: No primary diagnosis found.    No diagnosis found.     Session Time: 10:00am - 11:00am - Emotional Regulation Techniques    Participation Level: Active  Behavioral Response: CasualAlertAnxious  Type of Therapy: Group Therapy  Treatment Goals addressed: Coping  Interventions: CBT, DBT, Solution Focused and Strength-based  Therapist Response:  having control of your emotions. Reports she try to "walk away" or remove myself from the situation. Kristen Padilla reports that she struggles regulating her feelings of loneliness and struggle accepting affection from others. Kristen Padilla states that she believes she does not like affection due to the childhood traumas she experienced with her mother. She shared that her father struggled with health issues, so he was not present during her childhood either.    Kristen Padilla shared that her  inability to accept affection sometimes affect her marriage. She shared that she often has this fear that she will be hurt and that everyone will walk away from her  Kristen Padilla reviewed the following emotional regulation skills:  Self-awareness, mindful awareness, cognitive reappraisal, adaptability, self-compassion, emotional support, Opposite Action, Check the Facts and Recognizing the Positives.     Session Time: 11:00a- 12:00pm - Chaplain Group   Participation Level: Active  Behavioral Response: CasualAlertAnxious  Type of Therapy: Group Therapy  Treatment Goals addressed: Coping  Interventions: CBT, DBT, Solution Focused and Strength-based  Summary: Chaplain    Therapist Response: Please see chaplain note   Session Time: 12:00- 12:50pm - Distress Tolerance Skills   Participation Level: Active  Behavioral Response: CasualAlertAnxious  Type of Therapy: Group Therapy  Treatment Goals addressed: Coping  Interventions: CBT, DBT, Solution Focused, Strength-based, Supportive and Reframing   Therapist Response: Kristen Padilla reviewed the following distress tolerance skills: Radical acceptance, Self-soothe with senses, Urge surfing, and T.I.P.P.   Session Time: 12:50 - 1:00pm - Check Out   Therapist Response: Reports she is feeling good after group. Kristen Padilla rated her mood at a 5 (improvement from this morning). She denied any SI, HI, AVH. Kristen Padilla reports she will attend group tomorrow morning.      [facility]-PHP THERAPIST 09/12/2020

## 2020-09-13 ENCOUNTER — Ambulatory Visit (INDEPENDENT_AMBULATORY_CARE_PROVIDER_SITE_OTHER): Payer: No Payment, Other | Admitting: Licensed Clinical Social Worker

## 2020-09-13 DIAGNOSIS — F322 Major depressive disorder, single episode, severe without psychotic features: Secondary | ICD-10-CM | POA: Diagnosis not present

## 2020-09-13 NOTE — Progress Notes (Incomplete Revision)
I reviewed chart and agreed with the findings and treatment Plan of the patient.  Syed Arfeen, MD    Virtual Visit via Video Note  I connected with Kristen Padilla on 09/13/20 at  9:00 AM EDT by a video enabled telemedicine application and verified that I am speaking with the correct person using two identifiers.  Location: Patient: Home Provider: Office   I discussed the limitations of evaluation and management by telemedicine and the availability of in person appointments. The patient expressed understanding and agreed to proceed.    I discussed the assessment and treatment plan with the patient. The patient was provided an opportunity to ask questions and all were answered. The patient agreed with the plan and demonstrated an understanding of the instructions.   The patient was advised to call back or seek an in-person evaluation if the symptoms worsen or if the condition fails to improve as anticipated.  I provided 15 minutes of non-face-to-face time during this encounter.   Tanika N Lewis, NP   [facility]  Outpatient Program Discharge Summary  Kristen Padilla [id number]  Admission date:09/04/2020 Discharge date: 09/14/2020  Reason for admission: Per admission assessment note:Kristen Padilla is a 30 y.o. female with medical history significant of depression and morbid obesity presents after trying to harm herself by taking anywhere from 20-30 500 mg tablets of Tylenol between 12-1 AM this morning and a bottle of NyQuil.  Patient notes that she has had a lot of stress and been very depressed lately.  acetaminophen level 63->150, and salicylate level less than 7.  UDS was negative.Poison control had been contacted and it was recommended to initiate Mucomyst due to the rising acetaminophen levels   Progress in Program Toward Treatment Goals: Ongoing, Kristen Padilla attended and participated with daily  group session with active and engaged participation.  Denying suicidal or homicidal ideations.  Denies auditory or visual hallucinations.  Reports her mood has improved since attending the program.  Discontinue BuSpar due to patient reports of worsening mood irritability.  Patient to continue with Wellbutrin 300 mg.  Patient to keep follow-up appointment with outpatient providers.  Progress (rationale): Keep follow-up Dr. Parsons, CSW to provide additional outpatient resources for mental health of [place] for continued group services  Take all medications as prescribed. Keep all follow-up appointments as scheduled.  Do not consume alcohol or use illegal drugs while on prescription medications. Report any adverse effects from your medications to your primary care provider promptly.  In the event of recurrent symptoms or worsening symptoms, call 911, a crisis hotline, or go to the nearest emergency department for evaluation.    Tanika Lewis NP 09/13/2020 

## 2020-09-13 NOTE — Progress Notes (Addendum)
I reviewed chart and agreed with the findings and treatment Plan of the patient.  Kathryne Sharper, MD    Virtual Visit via Video Note  I connected with Kristen Padilla on 09/13/20 at  9:00 AM EDT by a video enabled telemedicine application and verified that I am speaking with the correct person using two identifiers.  Location: Patient: Home Provider: Office   I discussed the limitations of evaluation and management by telemedicine and the availability of in person appointments. The patient expressed understanding and agreed to proceed.    I discussed the assessment and treatment plan with the patient. The patient was provided an opportunity to ask questions and all were answered. The patient agreed with the plan and demonstrated an understanding of the instructions.   The patient was advised to call back or seek an in-person evaluation if the symptoms worsen or if the condition fails to improve as anticipated.  I provided 15 minutes of non-face-to-face time during this encounter.   Oneta Rack, NP   [facility]  Outpatient Program Discharge Summary  KLARA STJAMES [id number]  Admission date:09/04/2020 Discharge date: 09/14/2020  Reason for admission: Per admission assessment note:Kristen Padilla is a 30 y.o. female with medical history significant of depression and morbid obesity presents after trying to harm herself by taking anywhere from 20-30 500 mg tablets of Tylenol between 12-1 AM this morning and a bottle of NyQuil.  Patient notes that she has had a lot of stress and been very depressed lately.  acetaminophen level 63->150, and salicylate level less than 7.  UDS was negative.Poison control had been contacted and it was recommended to initiate Mucomyst due to the rising acetaminophen levels   Progress in Program Toward Treatment Goals: Ongoing, Dylin attended and participated with daily  group session with active and engaged participation.  Denying suicidal or homicidal ideations.  Denies auditory or visual hallucinations.  Reports her mood has improved since attending the program.  Discontinue BuSpar due to patient reports of worsening mood irritability.  Patient to continue with Wellbutrin 300 mg.  Patient to keep follow-up appointment with outpatient providers.  Progress (rationale): Keep follow-up Dr. Doyne Keel, CSW to provide additional outpatient resources for mental health of [facility] for continued group services  Take all medications as prescribed. Keep all follow-up appointments as scheduled.  Do not consume alcohol or use illegal drugs while on prescription medications. Report any adverse effects from your medications to your primary care provider promptly.  In the event of recurrent symptoms or worsening symptoms, call 911, a crisis hotline, or go to the nearest emergency department for evaluation.    Hillery Jacks NP 09/13/2020
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
## 2020-09-14 ENCOUNTER — Ambulatory Visit (HOSPITAL_COMMUNITY): Payer: No Payment, Other

## 2020-09-14 ENCOUNTER — Other Ambulatory Visit: Payer: Self-pay

## 2020-09-15 ENCOUNTER — Ambulatory Visit (HOSPITAL_COMMUNITY): Payer: Self-pay

## 2020-09-18 ENCOUNTER — Ambulatory Visit (HOSPITAL_COMMUNITY): Payer: Self-pay

## 2020-09-18 ENCOUNTER — Other Ambulatory Visit (HOSPITAL_COMMUNITY): Payer: Self-pay | Admitting: Psychiatry

## 2020-09-18 DIAGNOSIS — F411 Generalized anxiety disorder: Secondary | ICD-10-CM

## 2020-09-19 ENCOUNTER — Other Ambulatory Visit (HOSPITAL_COMMUNITY): Payer: Self-pay | Admitting: Psychiatry

## 2020-09-19 ENCOUNTER — Ambulatory Visit (HOSPITAL_COMMUNITY): Payer: Self-pay

## 2020-09-19 DIAGNOSIS — F330 Major depressive disorder, recurrent, mild: Secondary | ICD-10-CM

## 2020-09-20 ENCOUNTER — Ambulatory Visit (HOSPITAL_COMMUNITY): Payer: Self-pay

## 2020-09-21 ENCOUNTER — Ambulatory Visit (HOSPITAL_COMMUNITY): Payer: Self-pay

## 2020-09-22 ENCOUNTER — Ambulatory Visit (HOSPITAL_COMMUNITY): Payer: Self-pay

## 2020-10-05 ENCOUNTER — Ambulatory Visit (INDEPENDENT_AMBULATORY_CARE_PROVIDER_SITE_OTHER): Payer: No Payment, Other | Admitting: Licensed Clinical Social Worker

## 2020-10-05 ENCOUNTER — Other Ambulatory Visit: Payer: Self-pay

## 2020-10-05 DIAGNOSIS — F411 Generalized anxiety disorder: Secondary | ICD-10-CM | POA: Diagnosis not present

## 2020-10-05 DIAGNOSIS — F330 Major depressive disorder, recurrent, mild: Secondary | ICD-10-CM
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
## 2020-10-18 ENCOUNTER — Ambulatory Visit (HOSPITAL_COMMUNITY): Payer: No Payment, Other | Admitting: Licensed Clinical Social Worker

## 2020-10-19 ENCOUNTER — Ambulatory Visit (HOSPITAL_COMMUNITY): Payer: Self-pay | Admitting: Licensed Clinical Social Worker

## 2020-11-14 ENCOUNTER — Ambulatory Visit (HOSPITAL_COMMUNITY): Payer: No Payment, Other | Admitting: Licensed Clinical Social Worker

## 2020-11-30 ENCOUNTER — Telehealth (HOSPITAL_COMMUNITY): Payer: No Payment, Other | Admitting: Psychiatry

## 2021-07-09 ENCOUNTER — Other Ambulatory Visit: Payer: Self-pay

## 2021-07-09 ENCOUNTER — Encounter (HOSPITAL_BASED_OUTPATIENT_CLINIC_OR_DEPARTMENT_OTHER): Payer: Self-pay | Admitting: *Deleted

## 2021-07-09 ENCOUNTER — Emergency Department (HOSPITAL_BASED_OUTPATIENT_CLINIC_OR_DEPARTMENT_OTHER)
Admission: EM | Admit: 2021-07-09 | Discharge: 2021-07-10 | Disposition: A | Discharge status: Home / Self Care | Payer: Self-pay | Attending: Emergency Medicine | Admitting: Emergency Medicine

## 2021-07-09 ENCOUNTER — Emergency Department (HOSPITAL_BASED_OUTPATIENT_CLINIC_OR_DEPARTMENT_OTHER): Payer: Self-pay

## 2021-07-09 DIAGNOSIS — R109 Unspecified abdominal pain: Secondary | ICD-10-CM

## 2021-07-09 DIAGNOSIS — R1032 Left lower quadrant pain: Secondary | ICD-10-CM | POA: Insufficient documentation

## 2021-07-09 LAB — CBC
HCT: 44 % (ref 36.0–46.0)
Hemoglobin: 15.2 g/dL — ABNORMAL HIGH (ref 12.0–15.0)
MCH: 30.7 pg (ref 26.0–34.0)
MCHC: 34.5 g/dL (ref 30.0–36.0)
MCV: 88.9 fL (ref 80.0–100.0)
Platelets: 340 10*3/uL (ref 150–400)
RBC: 4.95 MIL/uL (ref 3.87–5.11)
RDW: 13.3 % (ref 11.5–15.5)
WBC: 8.9 10*3/uL (ref 4.0–10.5)
nRBC: 0 % (ref 0.0–0.2)

## 2021-07-09 LAB — LIPASE, BLOOD: Lipase: 39 U/L (ref 11–51)

## 2021-07-09 LAB — COMPREHENSIVE METABOLIC PANEL
ALT: 41 U/L (ref 0–44)
AST: 32 U/L (ref 15–41)
Albumin: 4.4 g/dL (ref 3.5–5.0)
Alkaline Phosphatase: 67 U/L (ref 38–126)
Anion gap: 10 (ref 5–15)
BUN: 8 mg/dL (ref 6–20)
CO2: 24 mmol/L (ref 22–32)
Calcium: 9.2 mg/dL (ref 8.9–10.3)
Chloride: 104 mmol/L (ref 98–111)
Creatinine, Ser: 0.71 mg/dL (ref 0.44–1.00)
GFR, Estimated: >60 mL/min (ref >60)
Glucose, Bld: 87 mg/dL (ref 70–99)
Potassium: 3.9 mmol/L (ref 3.5–5.1)
Sodium: 138 mmol/L (ref 135–145)
Total Bilirubin: 0.6 mg/dL (ref 0.3–1.2)
Total Protein: 8 g/dL (ref 6.5–8.1)

## 2021-07-09 LAB — URINALYSIS, ROUTINE W REFLEX MICROSCOPIC
Glucose, UA: NEGATIVE mg/dL
Ketones, ur: NEGATIVE mg/dL
Leukocytes,Ua: NEGATIVE
Nitrite: NEGATIVE
Protein, ur: NEGATIVE mg/dL
Specific Gravity, Urine: >=1.03 (ref 1.005–1.030)
pH: 5.5 (ref 5.0–8.0)

## 2021-07-09 LAB — URINALYSIS, MICROSCOPIC (REFLEX)

## 2021-07-09 LAB — PREGNANCY, URINE: Preg Test, Ur: NEGATIVE

## 2021-07-09 MED ORDER — ONDANSETRON HCL 4 MG/2ML IJ SOLN
4.0000 mg | Freq: Once | INTRAMUSCULAR | Status: AC
  Administered 2021-07-09: 4 mg via INTRAVENOUS
  Filled 2021-07-09: qty 2

## 2021-07-09 MED ORDER — SODIUM CHLORIDE 0.9 % IV BOLUS
1000.0000 mL | Freq: Once | INTRAVENOUS | Status: AC
  Administered 2021-07-09: 1000 mL via INTRAVENOUS

## 2021-07-09 MED ORDER — IOHEXOL 300 MG/ML  SOLN
100.0000 mL | Freq: Once | INTRAMUSCULAR | Status: AC | PRN
  Administered 2021-07-10: 100 mL via INTRAVENOUS

## 2021-07-09 MED ORDER — MORPHINE SULFATE (PF) 4 MG/ML IV SOLN
4.0000 mg | Freq: Once | INTRAVENOUS | Status: DC
  Filled 2021-07-09: qty 1

## 2021-07-09 NOTE — ED Provider Notes (Signed)
[facility] EMERGENCY DEPARTMENT Provider Note   CSN: [id number] Arrival date & time: 07/09/21  2002     History  Chief Complaint  Patient presents with   Abdominal Pain    Kristen Padilla is a 31 y.o. female.  Patient is a 31 year old female with past medical history anxiety, depression, and gastric sleeve surgery 2 months ago performed in [place].  Patient presenting today with complaints of abdominal pain and loose, green stools.  This has been worsening over the past 2 days.  She describes a crampy pain to the left lower quadrant and left abdomen that is intermittent throughout the day and associated with stooling.  She denies any fevers or chills.  She denies any ill contacts.  She denies any urinary complaints.  Last menstrual period last month and normal.  She denies any vaginal discharge and denies the possibility of pregnancy.  The history is provided by the patient.  Abdominal Pain Pain location:  LLQ and LUQ Pain quality: cramping   Pain radiates to:  Does not radiate Pain severity:  Moderate Onset quality:  Gradual Duration:  2 days Timing:  Intermittent Progression:  Worsening Chronicity:  New Relieved by:  Nothing Worsened by:  Movement and palpation Ineffective treatments:  None tried     Home Medications Prior to Admission medications   Medication Sig Start Date End Date Taking? Authorizing Provider  buPROPion (WELLBUTRIN XL) 150 MG 24 hr tablet TAKE 1 TABLET BY MOUTH EVERY DAY IN THE MORNING 09/19/20   Eulis Canner E, NP  buPROPion (WELLBUTRIN XL) 300 MG 24 hr tablet TAKE 1 TABLET (300 MG TOTAL) BY MOUTH EVERY MORNING. 09/07/20   Eulis Canner E, NP  busPIRone (BUSPAR) 10 MG tablet TAKE 1 TABLET BY MOUTH THREE TIMES A DAY Patient not taking: Reported on 09/11/2020 09/07/20   Salley Slaughter, NP  hydrOXYzine (ATARAX/VISTARIL) 10 MG tablet TAKE 1 TABLET BY MOUTH THREE TIMES A DAY AS NEEDED 09/18/20   Eulis Canner E, NP       Allergies    Aripiprazole and Sulfa antibiotics    Review of Systems   Review of Systems  Gastrointestinal:  Positive for abdominal pain.  All other systems reviewed and are negative.  Physical Exam Updated Vital Signs BP 122/87 (BP Location: Right Arm)    Pulse 64    Temp 97.8 F (36.6 C) (Oral)    Resp 18    Ht 5\' 2"  (1.575 m)    Wt 100.7 kg    LMP 07/08/2021    SpO2 100%    BMI 40.60 kg/m  Physical Exam Vitals and nursing note reviewed.  Constitutional:      General: She is not in acute distress.    Appearance: She is well-developed. She is not diaphoretic.  HENT:     Head: Normocephalic and atraumatic.  Cardiovascular:     Rate and Rhythm: Normal rate and regular rhythm.     Heart sounds: No murmur heard.   No friction rub. No gallop.  Pulmonary:     Effort: Pulmonary effort is normal. No respiratory distress.     Breath sounds: Normal breath sounds. No wheezing.  Abdominal:     General: Bowel sounds are normal. There is no distension.     Palpations: Abdomen is soft.     Tenderness: There is abdominal tenderness in the left upper quadrant and left lower quadrant. There is no right CVA tenderness, left CVA tenderness, guarding or rebound.  Musculoskeletal:  General: Normal range of motion.     Cervical back: Normal range of motion and neck supple.  Skin:    General: Skin is warm and dry.  Neurological:     General: No focal deficit present.     Mental Status: She is alert and oriented to person, place, and time.    ED Results / Procedures / Treatments   Labs (all labs ordered are listed, but only abnormal results are displayed) Labs Reviewed  CBC - Abnormal; Notable for the following components:      Result Value   Hemoglobin 15.2 (*)    All other components within normal limits  URINALYSIS, ROUTINE W REFLEX MICROSCOPIC - Abnormal; Notable for the following components:   APPearance HAZY (*)    Hgb urine dipstick MODERATE (*)    Bilirubin Urine SMALL  (*)    All other components within normal limits  URINALYSIS, MICROSCOPIC (REFLEX) - Abnormal; Notable for the following components:   Bacteria, UA FEW (*)    All other components within normal limits  LIPASE, BLOOD  COMPREHENSIVE METABOLIC PANEL  PREGNANCY, URINE    EKG None  Radiology No results found.  Procedures Procedures    Medications Ordered in ED Medications  sodium chloride 0.9 % bolus 1,000 mL (has no administration in time range)  ondansetron (ZOFRAN) injection 4 mg (has no administration in time range)  morphine 4 MG/ML injection 4 mg (has no administration in time range)    ED Course/ Medical Decision Making/ A&P  This patient presents to the ED for concern of abdominal pain and loose stools, this involves an extensive number of treatment options, and is a complaint that carries with it a high risk of complications and morbidity.  The differential diagnosis includes acute gastroenteritis, acute cholecystitis, postsurgical complication, diverticulitis   Co morbidities that complicate the patient evaluation  Recent gastric sleeve surgery performed in [place]   Additional history obtained:  No additional history needed  Lab Tests:  I Ordered, and personally interpreted labs.  The pertinent results include: Unremarkable CBC, CMP, lipase, and urinalysis   Imaging Studies ordered:  I ordered imaging studies including CT of the abdomen and pelvis I independently visualized and interpreted imaging which showed no acute intra-abdominal process, just postsurgical changes. I agree with the radiologist interpretation   Cardiac Monitoring:  No cardiac monitoring indicated   Medicines ordered and prescription drug management:  I ordered medication including IV fluids and Toradol/Zofran for hydration, pain, and nausea Reevaluation of the patient after these medicines showed that the patient improved I have reviewed the patients home medicines and have made  adjustments as needed   Test Considered:  No other test considered or indicated   Critical Interventions:  IV fluids and medications   Consultations Obtained:  No consultations indicated   Problem List / ED Course:  Patient presenting here with abdominal pain, worsening over the past several days.  She is 2 months status post gastric sleeve surgery performed in [place].  She has tenderness to exam in the left upper quadrant and left lower quadrant, but no peritoneal signs.  Laboratory studies are reassuring and CT scan of the abdomen and pelvis shows no acute intra-abdominal process. Patient received hydration and medications here in the ER and is now feeling improved.  She appears in no distress and I believe can safely be discharged.   Reevaluation:  After the interventions noted above, I reevaluated the patient and found that they have :improved   Social Determinants  of Health:  None   Dispostion:  After consideration of the diagnostic results and the patients response to treatment, I feel that the patent would benefit from discharge to home with follow-up as needed.    Final Clinical Impression(s) / ED Diagnoses Final diagnoses:  None    Rx / DC Orders ED Discharge Orders     None         Veryl Speak, MD 07/10/21 [phone]

## 2021-07-09 NOTE — ED Notes (Signed)
Patient refused morphine.  She states she has had the medication before, and did not like the way it made her feel.  Declines wanting alternate pain medication at this time.  Call bell in reach.

## 2021-07-09 NOTE — ED Triage Notes (Signed)
Abdominal pain. She had a gastric sleeve in October. Her stools are greens x 4 days.

## 2021-07-10 MED ORDER — KETOROLAC TROMETHAMINE 30 MG/ML IJ SOLN
30.0000 mg | Freq: Once | INTRAMUSCULAR | Status: AC
  Administered 2021-07-10: 30 mg via INTRAVENOUS
  Filled 2021-07-10: qty 1

## 2021-07-10 MED ORDER — ONDANSETRON 8 MG PO TBDP: ORAL_TABLET | ORAL | 0 refills | Status: DC

## 2021-07-10 NOTE — ED Notes (Signed)
Patient discharged to home.  All discharge instructions reviewed.  Patient verbalized understanding via teachback method.  VS WDL.  Respirations even and unlabored.  Ambulatory out of ED.   °

## 2021-07-10 NOTE — ED Notes (Signed)
Pt in CT.

## 2021-07-10 NOTE — Discharge Instructions (Signed)
Take ibuprofen 600 mg every 6 hours as needed for pain.  If you develop nausea, fill the prescription for Zofran which you can take as needed for this.  Return to the emergency department if you develop worsening pain, high fever, bloody stools, or other new and concerning symptoms.

## 2021-11-16 ENCOUNTER — Encounter (HOSPITAL_BASED_OUTPATIENT_CLINIC_OR_DEPARTMENT_OTHER): Payer: Self-pay

## 2021-11-16 ENCOUNTER — Emergency Department (HOSPITAL_BASED_OUTPATIENT_CLINIC_OR_DEPARTMENT_OTHER)
Admission: EM | Admit: 2021-11-16 | Discharge: 2021-11-16 | Disposition: A | Discharge status: Home / Self Care | Payer: Medicaid Other | Attending: Emergency Medicine | Admitting: Emergency Medicine

## 2021-11-16 ENCOUNTER — Emergency Department (HOSPITAL_BASED_OUTPATIENT_CLINIC_OR_DEPARTMENT_OTHER): Payer: Medicaid Other

## 2021-11-16 ENCOUNTER — Other Ambulatory Visit: Payer: Self-pay

## 2021-11-16 DIAGNOSIS — R109 Unspecified abdominal pain: Secondary | ICD-10-CM | POA: Diagnosis present

## 2021-11-16 DIAGNOSIS — R1084 Generalized abdominal pain: Secondary | ICD-10-CM | POA: Insufficient documentation

## 2021-11-16 LAB — URINALYSIS, ROUTINE W REFLEX MICROSCOPIC
Bilirubin Urine: NEGATIVE
Glucose, UA: NEGATIVE mg/dL
Ketones, ur: NEGATIVE mg/dL
Leukocytes,Ua: NEGATIVE
Nitrite: NEGATIVE
Specific Gravity, Urine: 1.027 (ref 1.005–1.030)
pH: 5.5 (ref 5.0–8.0)

## 2021-11-16 LAB — CBC WITH DIFFERENTIAL/PLATELET
Abs Immature Granulocytes: 0.03 10*3/uL (ref 0.00–0.07)
Basophils Absolute: 0.1 10*3/uL (ref 0.0–0.1)
Basophils Relative: 1 %
Eosinophils Absolute: 0.2 10*3/uL (ref 0.0–0.5)
Eosinophils Relative: 2 %
HCT: 37.5 % (ref 36.0–46.0)
Hemoglobin: 12.9 g/dL (ref 12.0–15.0)
Immature Granulocytes: 0 %
Lymphocytes Relative: 32 %
Lymphs Abs: 2.9 10*3/uL (ref 0.7–4.0)
MCH: 30.2 pg (ref 26.0–34.0)
MCHC: 34.4 g/dL (ref 30.0–36.0)
MCV: 87.8 fL (ref 80.0–100.0)
Monocytes Absolute: 0.6 10*3/uL (ref 0.1–1.0)
Monocytes Relative: 7 %
Neutro Abs: 5.4 10*3/uL (ref 1.7–7.7)
Neutrophils Relative %: 58 %
Platelets: 310 10*3/uL (ref 150–400)
RBC: 4.27 MIL/uL (ref 3.87–5.11)
RDW: 12.5 % (ref 11.5–15.5)
WBC: 9.1 10*3/uL (ref 4.0–10.5)
nRBC: 0 % (ref 0.0–0.2)

## 2021-11-16 LAB — COMPREHENSIVE METABOLIC PANEL
ALT: 9 U/L (ref 0–44)
AST: 12 U/L — ABNORMAL LOW (ref 15–41)
Albumin: 4 g/dL (ref 3.5–5.0)
Alkaline Phosphatase: 50 U/L (ref 38–126)
Anion gap: 9 (ref 5–15)
BUN: 10 mg/dL (ref 6–20)
CO2: 23 mmol/L (ref 22–32)
Calcium: 8.7 mg/dL — ABNORMAL LOW (ref 8.9–10.3)
Chloride: 109 mmol/L (ref 98–111)
Creatinine, Ser: 0.66 mg/dL (ref 0.44–1.00)
GFR, Estimated: >60 mL/min (ref >60)
Glucose, Bld: 71 mg/dL (ref 70–99)
Potassium: 3.8 mmol/L (ref 3.5–5.1)
Sodium: 141 mmol/L (ref 135–145)
Total Bilirubin: 0.4 mg/dL (ref 0.3–1.2)
Total Protein: 6.7 g/dL (ref 6.5–8.1)

## 2021-11-16 LAB — LIPASE, BLOOD: Lipase: 30 U/L (ref 11–51)

## 2021-11-16 LAB — PREGNANCY, URINE: Preg Test, Ur: NEGATIVE

## 2021-11-16 MED ORDER — IOHEXOL 300 MG/ML  SOLN
100.0000 mL | Freq: Once | INTRAMUSCULAR | Status: AC | PRN
  Administered 2021-11-16: 100 mL via INTRAVENOUS

## 2021-11-16 MED ORDER — DICYCLOMINE HCL 20 MG PO TABS: 20.0000 mg | ORAL_TABLET | Freq: Two times a day (BID) | ORAL | 0 refills | Status: DC

## 2021-11-16 MED ORDER — SODIUM CHLORIDE 0.9 % IV BOLUS
1000.0000 mL | Freq: Once | INTRAVENOUS | Status: AC
  Administered 2021-11-16: 1000 mL via INTRAVENOUS

## 2021-11-16 MED ORDER — ONDANSETRON HCL 4 MG/2ML IJ SOLN
4.0000 mg | Freq: Once | INTRAMUSCULAR | Status: AC
  Administered 2021-11-16: 4 mg via INTRAVENOUS
  Filled 2021-11-16: qty 2

## 2021-11-16 MED ORDER — MORPHINE SULFATE (PF) 4 MG/ML IV SOLN
4.0000 mg | Freq: Once | INTRAVENOUS | Status: AC
  Administered 2021-11-16: 4 mg via INTRAVENOUS
  Filled 2021-11-16: qty 1

## 2021-11-16 MED ORDER — ONDANSETRON 4 MG PO TBDP: 4.0000 mg | ORAL_TABLET | Freq: Three times a day (TID) | ORAL | 0 refills | Status: DC | PRN

## 2021-11-16 NOTE — ED Triage Notes (Signed)
Pt presents to ED from home C/O abdominal pain that began after drinking a Coke last night. Pt denies n/v/d.

## 2021-11-16 NOTE — ED Notes (Signed)
Patient transported to CT 

## 2021-11-16 NOTE — ED Notes (Signed)
Discharge paperwork given and understood. 

## 2021-11-16 NOTE — Discharge Instructions (Addendum)
Your work-up today was reassuring against a serious cause of your abdominal pain.  CT scan did not show anything concerning.  I have sent Zofran, and Bentyl to the pharmacy for you.  If you have any worsening symptoms return to the emergency room.  Otherwise have attached follow-up information with gastroenterology and information for [facility] for you to establish care with.

## 2021-11-16 NOTE — ED Provider Notes (Signed)
[facility] EMERGENCY DEPT Provider Note   CSN: [id number] Arrival date & time: 11/16/21  1452     History  Chief Complaint  Patient presents with   Abdominal Pain    Kristen Padilla is a 31 y.o. female.  31 year old female presents today for evaluation of abdominal pain onset last night.  Patient states the pain started after drinking Coca-Cola.  States since onset pain intensity worsens and she describes it as a burning sensation.  Patient is status post gastric sleeve.  She states she had this done in [place] in October 2022.  Denies nausea, vomiting, constipation, diarrhea, fever, dysuria, or other complaints.  Sister is at bedside.  The history is provided by the patient. No language interpreter was used.      Home Medications Prior to Admission medications   Medication Sig Start Date End Date Taking? Authorizing Provider  buPROPion (WELLBUTRIN XL) 150 MG 24 hr tablet TAKE 1 TABLET BY MOUTH EVERY DAY IN THE MORNING 09/19/20   Toy Cookey E, NP  buPROPion (WELLBUTRIN XL) 300 MG 24 hr tablet TAKE 1 TABLET (300 MG TOTAL) BY MOUTH EVERY MORNING. 09/07/20   Toy Cookey E, NP  busPIRone (BUSPAR) 10 MG tablet TAKE 1 TABLET BY MOUTH THREE TIMES A DAY Patient not taking: Reported on 09/11/2020 09/07/20   Shanna Cisco, NP  hydrOXYzine (ATARAX/VISTARIL) 10 MG tablet TAKE 1 TABLET BY MOUTH THREE TIMES A DAY AS NEEDED 09/18/20   Shanna Cisco, NP  ondansetron (ZOFRAN-ODT) 8 MG disintegrating tablet 8mg  ODT q4 hours prn nausea 07/10/21   09/07/21, MD      Allergies    Aripiprazole and Sulfa antibiotics    Review of Systems   Review of Systems  Constitutional:  Negative for chills and fever.  Respiratory:  Negative for shortness of breath.   Gastrointestinal:  Positive for abdominal pain. Negative for blood in stool, constipation, diarrhea, nausea and vomiting.  Genitourinary:  Negative for dysuria.  Neurological:  Negative for  light-headedness and headaches.  All other systems reviewed and are negative.  Physical Exam Updated Vital Signs BP 100/69 (BP Location: Right Arm)   Pulse 60   Temp 97.9 F (36.6 C) (Oral)   Resp 16   Ht 5\' 2"  (1.575 m)   Wt 90.3 kg   LMP 11/13/2021 (Exact Date)   SpO2 97%   BMI 36.41 kg/m  Physical Exam Vitals and nursing note reviewed.  Constitutional:      General: She is not in acute distress.    Appearance: Normal appearance. She is not ill-appearing.  HENT:     Head: Normocephalic and atraumatic.     Nose: Nose normal.  Eyes:     General: No scleral icterus.    Extraocular Movements: Extraocular movements intact.     Conjunctiva/sclera: Conjunctivae normal.  Cardiovascular:     Rate and Rhythm: Normal rate and regular rhythm.     Pulses: Normal pulses.  Pulmonary:     Effort: Pulmonary effort is normal. No respiratory distress.     Breath sounds: Normal breath sounds. No wheezing or rales.  Abdominal:     General: There is no distension.     Palpations: Abdomen is soft.     Tenderness: There is no abdominal tenderness. There is no right CVA tenderness, left CVA tenderness or guarding.  Musculoskeletal:        General: Normal range of motion.     Cervical back: Normal range of motion.  Skin:  General: Skin is warm and dry.  Neurological:     General: No focal deficit present.     Mental Status: She is alert. Mental status is at baseline.    ED Results / Procedures / Treatments   Labs (all labs ordered are listed, but only abnormal results are displayed) Labs Reviewed - No data to display  EKG None  Radiology No results found.  Procedures Procedures    Medications Ordered in ED Medications  morphine (PF) 4 MG/ML injection 4 mg (has no administration in time range)  ondansetron (ZOFRAN) injection 4 mg (has no administration in time range)  sodium chloride 0.9 % bolus 1,000 mL (has no administration in time range)    ED Course/ Medical  Decision Making/ A&P                           Medical Decision Making Amount and/or Complexity of Data Reviewed Labs: ordered. Radiology: ordered.  Risk Prescription drug management.   Medical Decision Making / ED Course   This patient presents to the ED for concern of abdominal pain, this involves an extensive number of treatment options, and is a complaint that carries with it a high risk of complications and morbidity.  The differential diagnosis includes appendicitis, gastritis, gastroenteritis constipation, cholecystitis  MDM: 31 year old female presents today for evaluation of abdominal pain onset last night following drinking soft drinks.  History of gastric sleeve.  CBC without leukocytosis or anemia.  CMP without acute findings.  UA without evidence of UTI.  CT abdomen pelvis without acute intra-abdominal findings.  Following IV hydration, pain medication patient with significant improvement in symptoms.  Patient appears much more comfortable.  We will provide patient Zofran, Bentyl and GI referral.  Patient voices understanding and is in agreement with plan.  Return precautions discussed.   Lab Tests: -I ordered, reviewed, and interpreted labs.   The pertinent results include:   Labs Reviewed  COMPREHENSIVE METABOLIC PANEL - Abnormal; Notable for the following components:      Result Value   Calcium 8.7 (*)    AST 12 (*)    All other components within normal limits  URINALYSIS, ROUTINE W REFLEX MICROSCOPIC - Abnormal; Notable for the following components:   Hgb urine dipstick MODERATE (*)    Protein, ur TRACE (*)    All other components within normal limits  CBC WITH DIFFERENTIAL/PLATELET  LIPASE, BLOOD  PREGNANCY, URINE      EKG  EKG Interpretation  Date/Time:    Ventricular Rate:    PR Interval:    QRS Duration:   QT Interval:    QTC Calculation:   R Axis:     Text Interpretation:           Imaging Studies ordered: I ordered imaging studies  including CT abdomen pelvis with contrast I independently visualized and interpreted imaging. I agree with the radiologist interpretation   Medicines ordered and prescription drug management: Meds ordered this encounter  Medications   morphine (PF) 4 MG/ML injection 4 mg   ondansetron (ZOFRAN) injection 4 mg   sodium chloride 0.9 % bolus 1,000 mL   iohexol (OMNIPAQUE) 300 MG/ML solution 100 mL    -I have reviewed the patients home medicines and have made adjustments as needed  Reevaluation: After the interventions noted above, I reevaluated the patient and found that they have :improved  Co morbidities that complicate the patient evaluation  Past Medical History:  Diagnosis Date  Depression    Miscarriage    Morbid obesity (HCC)       Dispostion: Patient is appropriate for discharge.  Discharged in stable condition.  Return precautions discussed.  Patient voices understanding and is in agreement with plan.  Final Clinical Impression(s) / ED Diagnoses Final diagnoses:  Generalized abdominal pain    Rx / DC Orders ED Discharge Orders          Ordered    ondansetron (ZOFRAN-ODT) 4 MG disintegrating tablet  Every 8 hours PRN        11/16/21 2009    dicyclomine (BENTYL) 20 MG tablet  2 times daily        11/16/21 2009              Marita Kansasli, July Linam, Cordelia Poche-C 11/16/21 2009    Terald Sleeperrifan, Matthew J, MD 11/17/21 [id number]

## 2021-12-17 ENCOUNTER — Inpatient Hospital Stay (HOSPITAL_COMMUNITY): Payer: Medicaid Other

## 2021-12-17 ENCOUNTER — Encounter (HOSPITAL_COMMUNITY): Payer: Self-pay

## 2021-12-17 ENCOUNTER — Inpatient Hospital Stay (HOSPITAL_COMMUNITY)
Admission: AD | Admit: 2021-12-17 | Discharge: 2021-12-18 | Disposition: A | Discharge status: Home / Self Care | Payer: Medicaid Other | Attending: Family Medicine | Admitting: Family Medicine

## 2021-12-17 DIAGNOSIS — O3680X0 Pregnancy with inconclusive fetal viability, not applicable or unspecified: Secondary | ICD-10-CM | POA: Insufficient documentation

## 2021-12-17 DIAGNOSIS — Z8759 Personal history of other complications of pregnancy, childbirth and the puerperium: Secondary | ICD-10-CM | POA: Diagnosis not present

## 2021-12-17 DIAGNOSIS — O26891 Other specified pregnancy related conditions, first trimester: Secondary | ICD-10-CM | POA: Insufficient documentation

## 2021-12-17 DIAGNOSIS — Z3A01 Less than 8 weeks gestation of pregnancy: Secondary | ICD-10-CM | POA: Insufficient documentation

## 2021-12-17 DIAGNOSIS — R109 Unspecified abdominal pain: Secondary | ICD-10-CM | POA: Diagnosis present

## 2021-12-17 LAB — POCT PREGNANCY, URINE: Preg Test, Ur: POSITIVE — AB

## 2021-12-17 LAB — HCG, QUANTITATIVE, PREGNANCY: hCG, Beta Chain, Quant, S: 165 m[IU]/mL — ABNORMAL HIGH (ref <5)

## 2021-12-17 NOTE — MAU Note (Signed)
..  Kristen Padilla is a 31 y.o. at Unknown here in MAU reporting: abdominal cramping that began around 1pm. Denies vaginal bleeding.  Pt reports she had a gastric sleeve in October 2022 and wanted to know if that impacted the pregnancy.  LMP: 11/13/2021  Pain score: 7/10 Vitals:   12/17/21 2206  BP: 103/68  Pulse: 74  Resp: 17  Temp: 98.8 F (37.1 C)  SpO2: 100%

## 2021-12-18 DIAGNOSIS — O3680X0 Pregnancy with inconclusive fetal viability, not applicable or unspecified: Secondary | ICD-10-CM

## 2021-12-18 NOTE — MAU Provider Note (Signed)
Chief Complaint:  Abdominal Pain   Event Date/Time   First Provider Initiated Contact with Patient 12/18/21 0121     HPI: Kristen Padilla is a 31 y.o. G1P0 at [redacted]w[redacted]d who presents to maternity admissions reporting mild lower abdominal cramping but no vaginal discharge or bleeding. Pain is not localized to one side over the other, is just concerned about miscarriage since she had one about 37mo ago (per pt). Denies vaginal bleeding. Positive pregnancy test was two days ago. LMP 11/13/21, has irregular cycles.  Pregnancy Course: Has not established care anywhere yet.  Past Medical History:  Diagnosis Date   Depression    Miscarriage    Morbid obesity (HCC)    OB History  Gravida Para Term Preterm AB Living  1            SAB IAB Ectopic Multiple Live Births               # Outcome Date GA Lbr Len/2nd Weight Sex Delivery Anes PTL Lv  1 Current            Past Surgical History:  Procedure Laterality Date   HERNIA REPAIR     LAPAROSCOPIC GASTRIC SLEEVE RESECTION     Repair of displaced fracture of the left radius  03/2020   Family History  Problem Relation Age of Onset   Diabetes Mother    Hypertension Mother    Depression Mother    Hypertension Father    Social History   Tobacco Use   Smoking status: Never   Smokeless tobacco: Never  Vaping Use   Vaping Use: Never used  Substance Use Topics   Alcohol use: No   Drug use: No   Allergies  Allergen Reactions   Aripiprazole Rash   Sulfa Antibiotics Rash   Medications Prior to Admission  Medication Sig Dispense Refill Last Dose   buPROPion (WELLBUTRIN XL) 150 MG 24 hr tablet TAKE 1 TABLET BY MOUTH EVERY DAY IN THE MORNING 30 tablet 2    buPROPion (WELLBUTRIN XL) 300 MG 24 hr tablet TAKE 1 TABLET (300 MG TOTAL) BY MOUTH EVERY MORNING. 90 tablet 1    busPIRone (BUSPAR) 10 MG tablet TAKE 1 TABLET BY MOUTH THREE TIMES A DAY (Patient not taking: Reported on 09/11/2020) 270 tablet 1    dicyclomine (BENTYL) 20 MG tablet  Take 1 tablet (20 mg total) by mouth 2 (two) times daily. 20 tablet 0    hydrOXYzine (ATARAX/VISTARIL) 10 MG tablet TAKE 1 TABLET BY MOUTH THREE TIMES A DAY AS NEEDED 90 tablet 2    ondansetron (ZOFRAN-ODT) 4 MG disintegrating tablet Take 1 tablet (4 mg total) by mouth every 8 (eight) hours as needed for nausea or vomiting. 20 tablet 0    I have reviewed patient's Past Medical Hx, Surgical Hx, Family Hx, Social Hx, medications and allergies.   ROS:  Pertinent items noted in HPI and remainder of comprehensive ROS otherwise negative.   Physical Exam  Patient Vitals for the past 24 hrs:  BP Temp Temp src Pulse Resp SpO2 Height Weight  12/17/21 2206 103/68 98.8 F (37.1 C) Oral 74 17 100 % 5\' 2"  (1.575 m) 195 lb (88.5 kg)   Constitutional: Well-developed, well-nourished female in no acute distress.  Cardiovascular: normal rate & rhythm Respiratory: normal effort GI: Abd soft, non-tender MS: Extremities nontender, no edema, normal ROM Neurologic: Alert and oriented x 4.  GU: no CVA tenderness Pelvic: exam deferred, sent to ultrasound due to MAU acuity  Labs: Results for orders placed or performed during the hospital encounter of 12/17/21 (from the past 24 hour(s))  Pregnancy, urine POC     Status: Abnormal   Collection Time: 12/17/21  9:51 PM  Result Value Ref Range   Preg Test, Ur POSITIVE (A) NEGATIVE  hCG, quantitative, pregnancy     Status: Abnormal   Collection Time: 12/17/21 11:03 PM  Result Value Ref Range   hCG, Beta Chain, Quant, S 165 (H) <5 mIU/mL   Imaging:  US OB LESS THAN 14 WEEKS WITH OB TRANSVAGINAL  Result Date: 12/17/2021 CLINICAL DATA:  Positive pregnancy test with pelvic pain, initial encounter EXAM: OBSTETRIC <14 WK [place] AND TRANSVAGINAL OB US TECHNIQUE: Both transabdominal and transvaginal ultrasound examinations were performed for complete evaluation of the gestation as well as the maternal uterus, adnexal regions, and pelvic cul-de-sac. Transvaginal technique  was performed to assess early pregnancy. COMPARISON:  None Available. FINDINGS: Intrauterine gestational sac: Absent Maternal uterus/adnexae: Ovaries are well visualized bilaterally without focal abnormality. IMPRESSION: No evidence of intrauterine or extra uterine gestational sac. Current beta HCG level corresponds to a gestation of approximately 2-3 weeks. Recommend follow-up quantitative B-HCG levels and follow-up US in 14-21 days to assess viability. This recommendation follows SRU consensus guidelines: Diagnostic Criteria for Nonviable Pregnancy Early in the First Trimester. Malva Limes Med 2013; 127:5170-01. Electronically Signed   By: Alcide Clever M.D.   On: 12/17/2021 23:51    MAU Course: Orders Placed This Encounter  Procedures   US OB LESS THAN 14 WEEKS WITH OB TRANSVAGINAL   hCG, quantitative, pregnancy   Pregnancy, urine POC   Discharge patient   No orders of the defined types were placed in this encounter.  MDM: Explained to patient that she is very early pregnant so nothing could be visualized in her uterus. Advised her to have follow up bHCG in our office on Thursday at 10am and gave bleeding/ectopic precautions. Will get a repeat ultrasound if quant rises appropriately for 10-14 days from now. Pt understanding.  Assessment: 1. Pregnancy of unknown anatomic location    Plan: Discharge home in stable condition with bleeding/ectopic precautions.     Follow-up Information     [facility] Follow up in 2 day(s).   Specialty: Obstetrics and Gynecology Why: 10am for bloodwork Contact information: [address] [place] [address] [phone]                Allergies as of 12/18/2021       Reactions   Aripiprazole Rash   Sulfa Antibiotics Rash        Medication List     TAKE these medications    buPROPion 300 MG 24 hr tablet Commonly known as: WELLBUTRIN XL TAKE 1 TABLET (300 MG TOTAL) BY  MOUTH EVERY MORNING.   buPROPion 150 MG 24 hr tablet Commonly known as: WELLBUTRIN XL TAKE 1 TABLET BY MOUTH EVERY DAY IN THE MORNING   busPIRone 10 MG tablet Commonly known as: BUSPAR TAKE 1 TABLET BY MOUTH THREE TIMES A DAY   dicyclomine 20 MG tablet Commonly known as: BENTYL Take 1 tablet (20 mg total) by mouth 2 (two) times daily.   hydrOXYzine 10 MG tablet Commonly known as: ATARAX TAKE 1 TABLET BY MOUTH THREE TIMES A DAY AS NEEDED   ondansetron 4 MG disintegrating tablet Commonly known as: ZOFRAN-ODT Take 1 tablet (4 mg total) by mouth every 8 (eight) hours as needed for nausea  or vomiting.       Edd Arbour, CNM, MSN, IBCLC Certified Nurse Midwife, [facility]

## 2021-12-20 ENCOUNTER — Ambulatory Visit (INDEPENDENT_AMBULATORY_CARE_PROVIDER_SITE_OTHER): Payer: Self-pay

## 2021-12-20 ENCOUNTER — Other Ambulatory Visit: Payer: Self-pay

## 2021-12-20 VITALS — BP 114/62 | HR 69 | Ht 62.0 in | Wt 193.0 lb

## 2021-12-20 DIAGNOSIS — O3680X0 Pregnancy with inconclusive fetal viability, not applicable or unspecified: Secondary | ICD-10-CM

## 2021-12-20 DIAGNOSIS — O26899 Other specified pregnancy related conditions, unspecified trimester: Secondary | ICD-10-CM

## 2021-12-20 DIAGNOSIS — Z6791 Unspecified blood type, Rh negative: Secondary | ICD-10-CM | POA: Insufficient documentation

## 2021-12-20 LAB — BETA HCG QUANT (REF LAB): hCG Quant: 468 m[IU]/mL

## 2021-12-20 NOTE — Progress Notes (Unsigned)
Pt here today for STAT Beta s/p pregnancy unknown location.  Pt denies vaginal bleeding or pain.  Pt reports that she is here today to see if her levels go up.  Pt advised that I will call her with results and f/u by end of day.  Pt verbalized understanding with no further questions.  Received the beta results of 468.  Reviewed results

## 2021-12-23 ENCOUNTER — Inpatient Hospital Stay (HOSPITAL_COMMUNITY): Payer: Medicaid Other

## 2021-12-23 ENCOUNTER — Inpatient Hospital Stay (HOSPITAL_COMMUNITY)
Admission: AD | Admit: 2021-12-23 | Discharge: 2021-12-23 | Disposition: A | Discharge status: Home / Self Care | Payer: Medicaid Other | Attending: Obstetrics & Gynecology | Admitting: Obstetrics & Gynecology

## 2021-12-23 DIAGNOSIS — O26899 Other specified pregnancy related conditions, unspecified trimester: Secondary | ICD-10-CM

## 2021-12-23 DIAGNOSIS — O26891 Other specified pregnancy related conditions, first trimester: Secondary | ICD-10-CM | POA: Diagnosis present

## 2021-12-23 DIAGNOSIS — O209 Hemorrhage in early pregnancy, unspecified: Secondary | ICD-10-CM | POA: Insufficient documentation

## 2021-12-23 DIAGNOSIS — Z6791 Unspecified blood type, Rh negative: Secondary | ICD-10-CM | POA: Insufficient documentation

## 2021-12-23 DIAGNOSIS — R1032 Left lower quadrant pain: Secondary | ICD-10-CM | POA: Diagnosis not present

## 2021-12-23 DIAGNOSIS — Z3A01 Less than 8 weeks gestation of pregnancy: Secondary | ICD-10-CM | POA: Insufficient documentation

## 2021-12-23 DIAGNOSIS — Z3490 Encounter for supervision of normal pregnancy, unspecified, unspecified trimester: Secondary | ICD-10-CM

## 2021-12-23 LAB — COMPREHENSIVE METABOLIC PANEL
ALT: 14 U/L (ref 0–44)
AST: 14 U/L — ABNORMAL LOW (ref 15–41)
Albumin: 4 g/dL (ref 3.5–5.0)
Alkaline Phosphatase: 52 U/L (ref 38–126)
Anion gap: 8 (ref 5–15)
BUN: 6 mg/dL (ref 6–20)
CO2: 23 mmol/L (ref 22–32)
Calcium: 9 mg/dL (ref 8.9–10.3)
Chloride: 106 mmol/L (ref 98–111)
Creatinine, Ser: 0.73 mg/dL (ref 0.44–1.00)
GFR, Estimated: >60 mL/min (ref >60)
Glucose, Bld: 82 mg/dL (ref 70–99)
Potassium: 3.6 mmol/L (ref 3.5–5.1)
Sodium: 137 mmol/L (ref 135–145)
Total Bilirubin: 0.6 mg/dL (ref 0.3–1.2)
Total Protein: 7.1 g/dL (ref 6.5–8.1)

## 2021-12-23 LAB — CBC
HCT: 40.4 % (ref 36.0–46.0)
Hemoglobin: 14.3 g/dL (ref 12.0–15.0)
MCH: 31.1 pg (ref 26.0–34.0)
MCHC: 35.4 g/dL (ref 30.0–36.0)
MCV: 87.8 fL (ref 80.0–100.0)
Platelets: 368 10*3/uL (ref 150–400)
RBC: 4.6 MIL/uL (ref 3.87–5.11)
RDW: 12.3 % (ref 11.5–15.5)
WBC: 11.1 10*3/uL — ABNORMAL HIGH (ref 4.0–10.5)
nRBC: 0 % (ref 0.0–0.2)

## 2021-12-23 LAB — WET PREP, GENITAL
Clue Cells Wet Prep HPF POC: NONE SEEN
Sperm: NONE SEEN
Trich, Wet Prep: NONE SEEN
WBC, Wet Prep HPF POC: >=10 — AB (ref <10)
Yeast Wet Prep HPF POC: NONE SEEN

## 2021-12-23 LAB — HCG, QUANTITATIVE, PREGNANCY: hCG, Beta Chain, Quant, S: 2091 m[IU]/mL — ABNORMAL HIGH (ref <5)

## 2021-12-23 MED ORDER — RHO D IMMUNE GLOBULIN 1500 UNIT/2ML IJ SOSY
300.0000 ug | PREFILLED_SYRINGE | Freq: Once | INTRAMUSCULAR | Status: AC
  Administered 2021-12-23: 300 ug via INTRAMUSCULAR
  Filled 2021-12-23: qty 2

## 2021-12-23 MED ORDER — PRENATAL 28-0.8 MG PO TABS: 1.0000 | ORAL_TABLET | Freq: Every day | ORAL | 12 refills | Status: DC

## 2021-12-23 NOTE — MAU Note (Signed)
..  Kristen Padilla is a 31 y.o. at [redacted]w[redacted]d here in MAU reporting: reports vaginal bleeding that began around 8pm, only when she wipes and a small amount on her underwear. Not wearing a pad now. Reports abdominal cramping that began Friday. Has not taken anything for it.    Pain score: 5/10 Vitals:   12/23/21 2123  BP: 119/70  Pulse: 73  Resp: 16  Temp: 98.5 F (36.9 C)  SpO2: 100%

## 2021-12-24 LAB — RH IG WORKUP (INCLUDES ABO/RH)
ABO/RH(D): O NEG
Antibody Screen: NEGATIVE
Gestational Age(Wks): 5.5
Unit division: 0

## 2021-12-24 LAB — GC/CHLAMYDIA PROBE AMP (~~LOC~~) NOT AT ARMC
Chlamydia: NEGATIVE
Comment: NEGATIVE
Comment: NORMAL
Neisseria Gonorrhea: NEGATIVE

## 2022-01-01 ENCOUNTER — Inpatient Hospital Stay (HOSPITAL_COMMUNITY)
Admission: AD | Admit: 2022-01-01 | Discharge: 2022-01-01 | Disposition: A | Discharge status: Home / Self Care | Payer: Self-pay | Attending: Obstetrics & Gynecology | Admitting: Obstetrics & Gynecology

## 2022-01-01 ENCOUNTER — Other Ambulatory Visit: Payer: Self-pay

## 2022-01-01 ENCOUNTER — Inpatient Hospital Stay (HOSPITAL_COMMUNITY): Payer: Self-pay

## 2022-01-01 ENCOUNTER — Encounter (HOSPITAL_COMMUNITY): Payer: Self-pay | Admitting: Obstetrics & Gynecology

## 2022-01-01 DIAGNOSIS — Z3A01 Less than 8 weeks gestation of pregnancy: Secondary | ICD-10-CM | POA: Insufficient documentation

## 2022-01-01 DIAGNOSIS — Z3490 Encounter for supervision of normal pregnancy, unspecified, unspecified trimester: Secondary | ICD-10-CM

## 2022-01-01 DIAGNOSIS — O26891 Other specified pregnancy related conditions, first trimester: Secondary | ICD-10-CM | POA: Insufficient documentation

## 2022-01-01 DIAGNOSIS — Z6791 Unspecified blood type, Rh negative: Secondary | ICD-10-CM

## 2022-01-01 DIAGNOSIS — O208 Other hemorrhage in early pregnancy: Secondary | ICD-10-CM | POA: Insufficient documentation

## 2022-01-01 LAB — URINALYSIS, ROUTINE W REFLEX MICROSCOPIC
Bacteria, UA: NONE SEEN
Bilirubin Urine: NEGATIVE
Glucose, UA: NEGATIVE mg/dL
Ketones, ur: NEGATIVE mg/dL
Leukocytes,Ua: NEGATIVE
Nitrite: NEGATIVE
Protein, ur: NEGATIVE mg/dL
Specific Gravity, Urine: 1.017 (ref 1.005–1.030)
pH: 7 (ref 5.0–8.0)

## 2022-01-01 LAB — CBC
HCT: 39.8 % (ref 36.0–46.0)
Hemoglobin: 13.8 g/dL (ref 12.0–15.0)
MCH: 30.3 pg (ref 26.0–34.0)
MCHC: 34.7 g/dL (ref 30.0–36.0)
MCV: 87.3 fL (ref 80.0–100.0)
Platelets: 306 10*3/uL (ref 150–400)
RBC: 4.56 MIL/uL (ref 3.87–5.11)
RDW: 12.2 % (ref 11.5–15.5)
WBC: 11 10*3/uL — ABNORMAL HIGH (ref 4.0–10.5)
nRBC: 0 % (ref 0.0–0.2)

## 2022-01-01 LAB — HCG, QUANTITATIVE, PREGNANCY: hCG, Beta Chain, Quant, S: 21898 m[IU]/mL — ABNORMAL HIGH (ref <5)

## 2022-01-01 NOTE — MAU Provider Note (Signed)
History     CSN: [id number]  Arrival date and time: 01/01/22 1606   Event Date/Time   First Provider Initiated Contact with Patient 01/01/22 1718      Chief Complaint  Patient presents with   Vaginal Bleeding   Abdominal Pain   HPI Kristen Padilla is a 31 y.o. G3P1011 at [redacted]w[redacted]d who presents to MAU with chief complaints of vaginal bleeding and abdominal pain. Patient was evaluated in MAU for these complaints on 12/23/2021. She states since that time her bleeding has become heavier and her abdominal pain is more intense. She woke up from a nap and reports seeing dark brown blood running down her legs. Her abdominal pain now 8/10. She has not taken medication for this complaint and declines medication during her MAU encounter.  OB History     Gravida  3   Para  1   Term  1   Preterm      AB  1   Living  1      SAB  1   IAB      Ectopic      Multiple      Live Births  1           Past Medical History:  Diagnosis Date   Depression    Miscarriage    Morbid obesity (HCC)     Past Surgical History:  Procedure Laterality Date   HERNIA REPAIR     LAPAROSCOPIC GASTRIC SLEEVE RESECTION     Repair of displaced fracture of the left radius  03/2020    Family History  Problem Relation Age of Onset   Diabetes Mother    Hypertension Mother    Depression Mother    Hypertension Father     Social History   Tobacco Use   Smoking status: Never   Smokeless tobacco: Never  Vaping Use   Vaping Use: Never used  Substance Use Topics   Alcohol use: No   Drug use: No    Allergies:  Allergies  Allergen Reactions   Aripiprazole Rash   Sulfa Antibiotics Rash    Medications Prior to Admission  Medication Sig Dispense Refill Last Dose   Prenatal 28-0.8 MG TABS Take 1 tablet by mouth daily. 30 tablet 12 01/01/2022   buPROPion (WELLBUTRIN XL) 150 MG 24 hr tablet TAKE 1 TABLET BY MOUTH EVERY DAY IN THE MORNING 30 tablet 2    buPROPion (WELLBUTRIN XL)  300 MG 24 hr tablet TAKE 1 TABLET (300 MG TOTAL) BY MOUTH EVERY MORNING. 90 tablet 1    busPIRone (BUSPAR) 10 MG tablet TAKE 1 TABLET BY MOUTH THREE TIMES A DAY (Patient not taking: Reported on 09/11/2020) 270 tablet 1    dicyclomine (BENTYL) 20 MG tablet Take 1 tablet (20 mg total) by mouth 2 (two) times daily. 20 tablet 0    hydrOXYzine (ATARAX/VISTARIL) 10 MG tablet TAKE 1 TABLET BY MOUTH THREE TIMES A DAY AS NEEDED 90 tablet 2    ondansetron (ZOFRAN-ODT) 4 MG disintegrating tablet Take 1 tablet (4 mg total) by mouth every 8 (eight) hours as needed for nausea or vomiting. 20 tablet 0     Review of Systems  Gastrointestinal:  Positive for abdominal pain.  Genitourinary:  Positive for vaginal bleeding.  All other systems reviewed and are negative.  Physical Exam   Blood pressure 102/67, pulse 79, resp. rate 15, last menstrual period 11/13/2021, SpO2 97 %.  Physical Exam Vitals and nursing note reviewed. Exam conducted with  a chaperone present.  Cardiovascular:     Rate and Rhythm: Normal rate.     Heart sounds: Normal heart sounds.  Pulmonary:     Effort: Pulmonary effort is normal.     Breath sounds: Normal breath sounds.  Abdominal:     Palpations: Abdomen is soft.  Skin:    Capillary Refill: Capillary refill takes less than 2 seconds.  Neurological:     Mental Status: She is alert and oriented to person, place, and time.  Psychiatric:        Mood and Affect: Mood normal.        Behavior: Behavior normal.    MAU Course  Procedures  MDM Orders Placed This Encounter  Procedures   US OB Transvaginal   Urinalysis, Routine w reflex microscopic Urine, Clean Catch   CBC   hCG, quantitative, pregnancy   Discharge patient   Patient Vitals for the past 24 hrs:  BP Pulse Resp SpO2  01/01/22 1738 120/71 82 14 --  01/01/22 1623 102/67 79 15 97 %   Results for orders placed or performed during the hospital encounter of 01/01/22 (from the past 24 hour(s))  Urinalysis, Routine  w reflex microscopic Urine, Clean Catch     Status: Abnormal   Collection Time: 01/01/22  4:21 PM  Result Value Ref Range   Color, Urine YELLOW YELLOW   APPearance HAZY (A) CLEAR   Specific Gravity, Urine 1.017 1.005 - 1.030   pH 7.0 5.0 - 8.0   Glucose, UA NEGATIVE NEGATIVE mg/dL   Hgb urine dipstick SMALL (A) NEGATIVE   Bilirubin Urine NEGATIVE NEGATIVE   Ketones, ur NEGATIVE NEGATIVE mg/dL   Protein, ur NEGATIVE NEGATIVE mg/dL   Nitrite NEGATIVE NEGATIVE   Leukocytes,Ua NEGATIVE NEGATIVE   WBC, UA 0-5 0 - 5 WBC/hpf   Bacteria, UA NONE SEEN NONE SEEN   Squamous Epithelial / LPF 0-5 0 - 5   Mucus PRESENT    Hyaline Casts, UA PRESENT   CBC     Status: Abnormal   Collection Time: 01/01/22  4:35 PM  Result Value Ref Range   WBC 11.0 (H) 4.0 - 10.5 K/uL   RBC 4.56 3.87 - 5.11 MIL/uL   Hemoglobin 13.8 12.0 - 15.0 g/dL   HCT 08.1 44.8 - 18.5 %   MCV 87.3 80.0 - 100.0 fL   MCH 30.3 26.0 - 34.0 pg   MCHC 34.7 30.0 - 36.0 g/dL   RDW 63.1 49.7 - 02.6 %   Platelets 306 150 - 400 K/uL   nRBC 0.0 0.0 - 0.2 %  hCG, quantitative, pregnancy     Status: Abnormal   Collection Time: 01/01/22  4:35 PM  Result Value Ref Range   hCG, Beta Chain, Quant, S 21,898 (H) <5 mIU/mL   US OB Transvaginal  Result Date: 01/01/2022 CLINICAL DATA:  Bleeding, cramping EXAM: TRANSVAGINAL OB ULTRASOUND TECHNIQUE: Transvaginal ultrasound was performed for complete evaluation of the gestation as well as the maternal uterus, adnexal regions, and pelvic cul-de-sac. COMPARISON:  12/23/2021 FINDINGS: Intrauterine gestational sac: Single Yolk sac:  Visualized. Embryo:  Not Visualized. MSD: 13.9 mm   6 w   2 d Subchorionic hemorrhage:  Small subchorionic hemorrhage. Maternal uterus/adnexae: Right ovary within normal limits. Left ovary was not visualized. No adnexal masses are seen. No free fluid. IMPRESSION: 1. Intrauterine gestational sac containing yolk sac. No visible embryo. Findings are suspicious but not yet  definitive for failed pregnancy. Recommend follow-up US in 10-14 days for definitive diagnosis.  This recommendation follows SRU consensus guidelines: Diagnostic Criteria for Nonviable Pregnancy Early in the First Trimester. Malva Limes Med 2013; 975:3005-11. 2. Small subchorionic hemorrhage. Electronically Signed   By: Duanne Guess D.O.   On: 01/01/2022 17:10    Assessment and Plan  --G3P1011 with confirmed IUP (+ GS, + YS) --Small Subchorionic hemorrhage noted --Reviewed pelvic rest precautions --Rhogam given 06/25 --Discharge home in stable condition with strict return precautions  F/U: Patient previously scheduled for viability scan 07/11. Advised to keep this appt  Calvert Cantor, CNM 01/01/2022, 7:36 PM

## 2022-01-01 NOTE — MAU Note (Addendum)
.  Kristen Padilla is a 31 y.o. at [redacted]w[redacted]d here in MAU reporting: woke up from a nap and noticed "a lot of dark brown blood like the end of a period running down her legs" states that she is nervous and this was more than she had seen last time. Is also having lower abdominal cramping she rates 8/10. Has not taken anything for pain.   Pain score: 8  Lab orders placed from triage:  UA

## 2022-01-08 ENCOUNTER — Ambulatory Visit (INDEPENDENT_AMBULATORY_CARE_PROVIDER_SITE_OTHER): Payer: Self-pay

## 2022-01-08 VITALS — BP 115/77 | HR 92 | Temp 98.0°F

## 2022-01-08 DIAGNOSIS — O3680X0 Pregnancy with inconclusive fetal viability, not applicable or unspecified: Secondary | ICD-10-CM

## 2022-01-08 DIAGNOSIS — Z3A01 Less than 8 weeks gestation of pregnancy: Secondary | ICD-10-CM

## 2022-01-08 DIAGNOSIS — Z6791 Unspecified blood type, Rh negative: Secondary | ICD-10-CM

## 2022-01-08 DIAGNOSIS — Z3491 Encounter for supervision of normal pregnancy, unspecified, first trimester: Secondary | ICD-10-CM

## 2022-01-08 DIAGNOSIS — O26899 Other specified pregnancy related conditions, unspecified trimester: Secondary | ICD-10-CM

## 2022-01-08 NOTE — Patient Instructions (Signed)

## 2022-01-08 NOTE — Progress Notes (Signed)
   GYNECOLOGY OFFICE VISIT NOTE  History:   Kristen Padilla is a 31 y.o. G3P1011 here today for ultrasound results.  Ultrasound shows live IUP measuring 6 weeks 6 days with EDD of 08/28/22.  Abdominal pain: No   Vaginal bleeding: No    OB History  Gravida Para Term Preterm AB Living  3 1 1   1 1   SAB IAB Ectopic Multiple Live Births  1       1    # Outcome Date GA Lbr Len/2nd Weight Sex Delivery Anes PTL Lv  3 Current           2 Term 12/14/10    F Vag-Spont   LIV     Complications: Shoulder Dystocia  1 SAB              Health Maintenance Due  Topic Date Due   Hepatitis C Screening  Never done   TETANUS/TDAP  Never done   PAP-Cervical Cytology Screening  Never done   PAP SMEAR-Modifier  Never done   COVID-19 Vaccine (3 - Pfizer series) 02/25/2020    Past Medical History:  Diagnosis Date   Depression    Miscarriage    Morbid obesity (HCC)     Past Surgical History:  Procedure Laterality Date   HERNIA REPAIR     LAPAROSCOPIC GASTRIC SLEEVE RESECTION     Repair of displaced fracture of the left radius  03/2020    The following portions of the patient's history were reviewed and updated as appropriate: allergies, current medications, past family history, past medical history, past social history, past surgical history and problem list.   Review of Systems:  Pertinent items noted in HPI and remainder of comprehensive ROS otherwise negative.  Physical Exam:  BP 115/77   Pulse 92   Temp 98 F (36.7 C)   LMP 11/13/2021 (Exact Date)  CONSTITUTIONAL: Well-developed, well-nourished female in no acute distress.  HEENT:  Normocephalic, atraumatic. External right and left ear normal. No scleral icterus.  NECK: Normal range of motion, supple, no masses noted on observation SKIN: No rash noted. Not diaphoretic. No erythema. No pallor. MUSCULOSKELETAL: Normal range of motion. No edema noted. NEUROLOGIC: Alert and oriented to person, place, and time. Normal  muscle tone coordination.  PSYCHIATRIC: Normal mood and affect. Normal behavior. Normal judgment and thought content. RESPIRATORY: Effort normal, no problems with respiration noted  Assessment and Plan:  Viable Pregnancy - Z34.90  Reviewed results with patient that show a viable intrauterine pregnancy at 6 weeks of gestation. Recommended she contact providers to start prenatal care, and she was given a list of options in her AVS. Prescription sent for prenatal vitamins. Reviewed ED precautions including vaginal bleeding like a period or heavier, severe abdominal pain, and fever. All questions answered.   Please refer to After Visit Summary for other counseling recommendations.   Patient to follow up to begin prenatal care with [facility] on 7/28  Total face-to-face time with patient: 20 minutes.  Over 50% of encounter was spent on counseling and coordination of care.   8/28, CNM [facility], [facility]

## 2022-01-25 LAB — OB RESULTS CONSOLE HEPATITIS B SURFACE ANTIGEN: Hepatitis B Surface Ag: NEGATIVE

## 2022-01-25 LAB — OB RESULTS CONSOLE RUBELLA ANTIBODY, IGM: Rubella: NON-IMMUNE/NOT IMMUNE

## 2022-01-25 LAB — OB RESULTS CONSOLE RPR: RPR: NONREACTIVE

## 2022-01-25 LAB — OB RESULTS CONSOLE HIV ANTIBODY (ROUTINE TESTING): HIV: NONREACTIVE

## 2022-02-13 ENCOUNTER — Other Ambulatory Visit: Payer: Self-pay

## 2022-02-16 ENCOUNTER — Other Ambulatory Visit: Payer: Self-pay

## 2022-03-10 ENCOUNTER — Inpatient Hospital Stay (HOSPITAL_COMMUNITY)
Admission: AD | Admit: 2022-03-10 | Discharge: 2022-03-10 | Disposition: A | Discharge status: Home / Self Care | Payer: Medicaid Other | Attending: Obstetrics and Gynecology | Admitting: Obstetrics and Gynecology

## 2022-03-10 ENCOUNTER — Encounter (HOSPITAL_COMMUNITY): Payer: Self-pay | Admitting: Obstetrics and Gynecology

## 2022-03-10 DIAGNOSIS — O23592 Infection of other part of genital tract in pregnancy, second trimester: Secondary | ICD-10-CM | POA: Insufficient documentation

## 2022-03-10 DIAGNOSIS — N898 Other specified noninflammatory disorders of vagina: Secondary | ICD-10-CM

## 2022-03-10 DIAGNOSIS — O99891 Other specified diseases and conditions complicating pregnancy: Secondary | ICD-10-CM | POA: Insufficient documentation

## 2022-03-10 DIAGNOSIS — O26892 Other specified pregnancy related conditions, second trimester: Secondary | ICD-10-CM | POA: Diagnosis not present

## 2022-03-10 DIAGNOSIS — Z3A16 16 weeks gestation of pregnancy: Secondary | ICD-10-CM | POA: Insufficient documentation

## 2022-03-10 DIAGNOSIS — R300 Dysuria: Secondary | ICD-10-CM | POA: Insufficient documentation

## 2022-03-10 DIAGNOSIS — B3731 Acute candidiasis of vulva and vagina: Secondary | ICD-10-CM | POA: Insufficient documentation

## 2022-03-10 DIAGNOSIS — E669 Obesity, unspecified: Secondary | ICD-10-CM | POA: Diagnosis not present

## 2022-03-10 DIAGNOSIS — O99212 Obesity complicating pregnancy, second trimester: Secondary | ICD-10-CM | POA: Diagnosis not present

## 2022-03-10 LAB — URINALYSIS, ROUTINE W REFLEX MICROSCOPIC
Bilirubin Urine: NEGATIVE
Glucose, UA: NEGATIVE mg/dL
Hgb urine dipstick: NEGATIVE
Ketones, ur: 5 mg/dL — AB
Nitrite: NEGATIVE
Protein, ur: NEGATIVE mg/dL
Specific Gravity, Urine: 1.015 (ref 1.005–1.030)
pH: 5 (ref 5.0–8.0)

## 2022-03-10 LAB — WET PREP, GENITAL
Clue Cells Wet Prep HPF POC: NONE SEEN
Sperm: NONE SEEN
Trich, Wet Prep: NONE SEEN
WBC, Wet Prep HPF POC: >=10 — AB (ref <10)
Yeast Wet Prep HPF POC: NONE SEEN

## 2022-03-10 MED ORDER — TERCONAZOLE 0.4 % VA CREA: 1.0000 | TOPICAL_CREAM | Freq: Every day | VAGINAL | 0 refills | Status: DC

## 2022-03-10 MED ORDER — CEFADROXIL 500 MG PO CAPS: 500.0000 mg | ORAL_CAPSULE | Freq: Two times a day (BID) | ORAL | 0 refills | Status: AC

## 2022-03-10 NOTE — MAU Note (Signed)
.  Kristen Padilla is a 31 y.o. at [redacted]w[redacted]d here in MAU reporting: she has had itching and irritation to her vagina for a few days .burning with urination and  white vag  discharge with odor.  LMP:  Onset of complaint: 2-3 days Pain score: 2 Vitals:   03/10/22 1508  BP: 116/68  Pulse: 86  Resp: 18  Temp: 98.1 F (36.7 C)     CYE:LYHT get in room Lab orders placed from triage:

## 2022-03-10 NOTE — MAU Provider Note (Signed)
History     CSN: [id number]  Arrival date and time: 03/10/22 1446   Event Date/Time   First Provider Initiated Contact with Patient 03/10/22 1615      Chief Complaint  Patient presents with   Vaginal Discharge   Ms. Kristen Padilla is a 31 y.o. year old G38P1011 female at [redacted]w[redacted]d weeks gestation who presents to MAU reporting itching, irritation, vaginal odor, and burning with urination for the past 2-3 days. She rates the pain 2/10. She reports no problems in pregnancy. Her pregnancy is complicated by obesity. She receives [facility] with [facility]. Her spouse is present and contributing to the history taking.      OB History     Gravida  3   Para  1   Term  1   Preterm      AB  1   Living  1      SAB  1   IAB      Ectopic      Multiple      Live Births  1           Past Medical History:  Diagnosis Date   Depression    Miscarriage    Morbid obesity (HCC)     Past Surgical History:  Procedure Laterality Date   HERNIA REPAIR     LAPAROSCOPIC GASTRIC SLEEVE RESECTION     Repair of displaced fracture of the left radius  03/2020    Family History  Problem Relation Age of Onset   Diabetes Mother    Hypertension Mother    Depression Mother    Hypertension Father     Social History   Tobacco Use   Smoking status: Never   Smokeless tobacco: Never  Vaping Use   Vaping Use: Never used  Substance Use Topics   Alcohol use: No   Drug use: No    Allergies:  Allergies  Allergen Reactions   Aripiprazole Rash   Sulfa Antibiotics Rash    No medications prior to admission.    Review of Systems  Constitutional: Negative.   HENT: Negative.    Eyes: Negative.   Respiratory: Negative.    Cardiovascular: Negative.   Gastrointestinal: Negative.   Endocrine: Negative.   Genitourinary:  Positive for dysuria (burning with urination) and vaginal discharge.       Itching, vaginal odor  Musculoskeletal: Negative.   Skin:  Negative.   Allergic/Immunologic: Negative.   Neurological: Negative.   Hematological: Negative.   Psychiatric/Behavioral: Negative.     Physical Exam   Blood pressure (!) 98/58, pulse 71, temperature 98.2 F (36.8 C), temperature source Oral, resp. rate 16, height 5' 2.5" (1.588 m), weight 88 kg, last menstrual period 11/13/2021, SpO2 100 %.  Physical Exam Vitals and nursing note reviewed. Exam conducted with a chaperone present.  Constitutional:      Appearance: She is obese.  Cardiovascular:     Rate and Rhythm: Normal rate.  Pulmonary:     Effort: Pulmonary effort is normal.  Abdominal:     Palpations: Abdomen is soft.  Genitourinary:    General: Normal vulva.     Comments: Pelvic exam: External genitalia normal, SE: vaginal walls pink and well rugated, cervix is smooth, pink, no lesions, moderate amt of thick, clumpy, white vaginal d/c -- WP, GC/CT done, cervix visually closed, Uterus is non-tender, S=D, no CMT or friability, no adnexal tenderness.  Musculoskeletal:        General: Normal range of motion.  Skin:    General: Skin is warm and dry.  Neurological:     Mental Status: She is alert and oriented to person, place, and time.  Psychiatric:        Mood and Affect: Mood normal.        Behavior: Behavior normal.        Thought Content: Thought content normal.        Judgment: Judgment normal.    FHTs by doppler: 144 bpm  MAU Course  Procedures  MDM CCUA Wet Prep GC/CT -- Results pending   Results for orders placed or performed during the hospital encounter of 03/10/22 (from the past 24 hour(s))  Urinalysis, Routine w reflex microscopic Urine, Clean Catch     Status: Abnormal   Collection Time: 03/10/22  3:15 PM  Result Value Ref Range   Color, Urine YELLOW YELLOW   APPearance HAZY (A) CLEAR   Specific Gravity, Urine 1.015 1.005 - 1.030   pH 5.0 5.0 - 8.0   Glucose, UA NEGATIVE NEGATIVE mg/dL   Hgb urine dipstick NEGATIVE NEGATIVE   Bilirubin Urine  NEGATIVE NEGATIVE   Ketones, ur 5 (A) NEGATIVE mg/dL   Protein, ur NEGATIVE NEGATIVE mg/dL   Nitrite NEGATIVE NEGATIVE   Leukocytes,Ua SMALL (A) NEGATIVE   RBC / HPF 0-5 0 - 5 RBC/hpf   WBC, UA 0-5 0 - 5 WBC/hpf   Bacteria, UA RARE (A) NONE SEEN   Squamous Epithelial / LPF 6-10 0 - 5   Mucus PRESENT   Wet prep, genital     Status: Abnormal   Collection Time: 03/10/22  4:22 PM   Specimen: Vaginal  Result Value Ref Range   Yeast Wet Prep HPF POC NONE SEEN NONE SEEN   Trich, Wet Prep NONE SEEN NONE SEEN   Clue Cells Wet Prep HPF POC NONE SEEN NONE SEEN   WBC, Wet Prep HPF POC >=10 (A) <10   Sperm NONE SEEN     Assessment and Plan  1. Dysuria during pregnancy in second trimester - Rx for Cefadroxil 500 mg BIDx 7 days - Information provided on dysuria   2. Vaginal discharge during pregnancy in second trimester - Rx for Terazol cream 0.4% vaginal cream pv hs x 7 days - Information provided on candidiasis vaginitis   3. [redacted] weeks gestation of pregnancy   - Discharge patient - Keep scheduled appts with [facility] - Patient verbalized an understanding of the plan of care and agrees.   Raelyn Mora, CNM 03/10/2022, 4:15 PM

## 2022-03-11 LAB — GC/CHLAMYDIA PROBE AMP (~~LOC~~) NOT AT ARMC
Chlamydia: NEGATIVE
Comment: NEGATIVE
Comment: NORMAL
Neisseria Gonorrhea: NEGATIVE

## 2022-03-11 LAB — CULTURE, OB URINE: Culture: 10000 — AB

## 2022-04-09 ENCOUNTER — Inpatient Hospital Stay (HOSPITAL_COMMUNITY)
Admission: AD | Admit: 2022-04-09 | Discharge: 2022-04-09 | Disposition: A | Discharge status: Home / Self Care | Payer: Medicaid Other | Attending: Obstetrics and Gynecology | Admitting: Obstetrics and Gynecology

## 2022-04-09 ENCOUNTER — Encounter (HOSPITAL_COMMUNITY): Payer: Self-pay | Admitting: Obstetrics and Gynecology

## 2022-04-09 ENCOUNTER — Inpatient Hospital Stay (HOSPITAL_BASED_OUTPATIENT_CLINIC_OR_DEPARTMENT_OTHER): Payer: Medicaid Other

## 2022-04-09 DIAGNOSIS — N888 Other specified noninflammatory disorders of cervix uteri: Secondary | ICD-10-CM

## 2022-04-09 DIAGNOSIS — E669 Obesity, unspecified: Secondary | ICD-10-CM

## 2022-04-09 DIAGNOSIS — Z3A21 21 weeks gestation of pregnancy: Secondary | ICD-10-CM | POA: Diagnosis not present

## 2022-04-09 DIAGNOSIS — O4690 Antepartum hemorrhage, unspecified, unspecified trimester: Secondary | ICD-10-CM | POA: Diagnosis not present

## 2022-04-09 DIAGNOSIS — O99212 Obesity complicating pregnancy, second trimester: Secondary | ICD-10-CM | POA: Diagnosis not present

## 2022-04-09 DIAGNOSIS — O4692 Antepartum hemorrhage, unspecified, second trimester: Secondary | ICD-10-CM | POA: Diagnosis not present

## 2022-04-09 LAB — URINALYSIS, ROUTINE W REFLEX MICROSCOPIC
Glucose, UA: NEGATIVE mg/dL
Ketones, ur: NEGATIVE mg/dL
Leukocytes,Ua: NEGATIVE
Nitrite: NEGATIVE
Protein, ur: 30 mg/dL — AB
Specific Gravity, Urine: 1.031 — ABNORMAL HIGH (ref 1.005–1.030)
pH: 5 (ref 5.0–8.0)

## 2022-04-09 LAB — CBC
HCT: 36.5 % (ref 36.0–46.0)
Hemoglobin: 12.9 g/dL (ref 12.0–15.0)
MCH: 31.8 pg (ref 26.0–34.0)
MCHC: 35.3 g/dL (ref 30.0–36.0)
MCV: 89.9 fL (ref 80.0–100.0)
Platelets: 303 10*3/uL (ref 150–400)
RBC: 4.06 MIL/uL (ref 3.87–5.11)
RDW: 12.7 % (ref 11.5–15.5)
WBC: 15.5 10*3/uL — ABNORMAL HIGH (ref 4.0–10.5)
nRBC: 0 % (ref 0.0–0.2)

## 2022-04-09 MED ORDER — RHO D IMMUNE GLOBULIN 1500 UNIT/2ML IJ SOSY
300.0000 ug | PREFILLED_SYRINGE | Freq: Once | INTRAMUSCULAR | Status: AC
  Administered 2022-04-09: 300 ug via INTRAMUSCULAR
  Filled 2022-04-09: qty 2

## 2022-04-09 MED ORDER — PROGESTERONE MICRONIZED 100 MG PO CAPS
200.0000 mg | ORAL_CAPSULE | Freq: Once | ORAL | Status: AC
  Administered 2022-04-09: 200 mg via VAGINAL
  Filled 2022-04-09: qty 2

## 2022-04-09 MED ORDER — PROGESTERONE 200 MG PO CAPS: 200.0000 mg | ORAL_CAPSULE | Freq: Every day | ORAL | 1 refills | Status: DC

## 2022-04-09 NOTE — MAU Note (Signed)
.  Kristen Padilla is a 31 y.o. at [redacted]w[redacted]d here in MAU reporting vaginal bleeding after straining to have a BM. Has trouble having BMs despite taking Miralax, stool softening, etc. Pretty sure bleeding was from vagina. Has had some mild cramping last few days. Has felt FM since being at hospital  Onset of complaint: 1900 Pain score: 6 Vitals:   04/09/22 1943 04/09/22 1948  BP:  114/72  Pulse:  92  Resp: 17   Temp: 98.4 F (36.9 C)   SpO2: 100%      FHT:149 Lab orders placed from triage:  u/a

## 2022-04-09 NOTE — MAU Provider Note (Signed)
Chief Complaint:  Vaginal Bleeding   Event Date/Time   First Provider Initiated Contact with Patient 04/09/22 2012     HPI: Kristen Padilla is a 31 y.o. [id number] at 40w0dwho presents to maternity admissions reporting episode of bleeding after straining for a bowel movement.  Also had some cramping afterward.  States took tissue and stuck her fingers in her vagina and saw a lot of blood  Is sure it was not rectal . She reports good fetal movement, denies LOF, vaginal itching/burning, urinary symptoms, h/a, dizziness, n/v, diarrhea, constipation or fever/chills.    Vaginal Bleeding The patient's primary symptoms include pelvic pain. The patient's pertinent negatives include no genital itching or genital odor. This is a new problem. The current episode started today. The problem has been resolved. She is pregnant. Associated symptoms include constipation. Pertinent negatives include no chills, diarrhea, dysuria or fever. Nothing aggravates the symptoms. She has tried nothing for the symptoms.   RN Note: .Kristen Padilla is a 31 y.o. at [redacted]w[redacted]d here in MAU reporting vaginal bleeding after straining to have a BM. Has trouble having BMs despite taking Miralax, stool softening, etc. Pretty sure bleeding was from vagina. Has had some mild cramping last few days. Has felt FM since being at hospital  Onset of complaint: 1900 Pain score: 6  Past Medical History: Past Medical History:  Diagnosis Date   Depression    Miscarriage    Morbid obesity (HCC)     Past obstetric history: OB History  Gravida Para Term Preterm AB Living  5 1 1   3 1   SAB IAB Ectopic Multiple Live Births  3       1    # Outcome Date GA Lbr Len/2nd Weight Sex Delivery Anes PTL Lv  5 Current           4 Term 12/14/10    F Vag-Spont   LIV     Complications: Shoulder Dystocia  3 SAB           2 SAB           1 SAB             Past Surgical History: Past Surgical History:  Procedure Laterality  Date   HERNIA REPAIR     LAPAROSCOPIC GASTRIC SLEEVE RESECTION     Repair of displaced fracture of the left radius  03/2020    Family History: Family History  Problem Relation Age of Onset   Diabetes Mother    Hypertension Mother    Depression Mother    Hypertension Father     Social History: Social History   Tobacco Use   Smoking status: Never   Smokeless tobacco: Never  Vaping Use   Vaping Use: Never used  Substance Use Topics   Alcohol use: No   Drug use: No    Allergies:  Allergies  Allergen Reactions   Aripiprazole Rash   Sulfa Antibiotics Rash    Meds:  Medications Prior to Admission  Medication Sig Dispense Refill Last Dose   Prenatal 28-0.8 MG TABS Take 1 tablet by mouth daily. 30 tablet 12 04/09/2022   buPROPion (WELLBUTRIN XL) 150 MG 24 hr tablet TAKE 1 TABLET BY MOUTH EVERY DAY IN THE MORNING (Patient not taking: Reported on 04/09/2022) 30 tablet 2 Not Taking   buPROPion (WELLBUTRIN XL) 300 MG 24 hr tablet TAKE 1 TABLET (300 MG TOTAL) BY MOUTH EVERY MORNING. (Patient not taking: Reported on 04/09/2022) 90 tablet 1 Not Taking  busPIRone (BUSPAR) 10 MG tablet TAKE 1 TABLET BY MOUTH THREE TIMES A DAY (Patient not taking: Reported on 09/11/2020) 270 tablet 1    hydrOXYzine (ATARAX/VISTARIL) 10 MG tablet TAKE 1 TABLET BY MOUTH THREE TIMES A DAY AS NEEDED (Patient not taking: Reported on 04/09/2022) 90 tablet 2 Not Taking   ondansetron (ZOFRAN-ODT) 4 MG disintegrating tablet Take 1 tablet (4 mg total) by mouth every 8 (eight) hours as needed for nausea or vomiting. 20 tablet 0 More than a month   terconazole (TERAZOL 7) 0.4 % vaginal cream Place 1 applicator vaginally at bedtime. Use for seven days 45 g 0 More than a month    I have reviewed patient's Past Medical Hx, Surgical Hx, Family Hx, Social Hx, medications and allergies.   ROS:  Review of Systems  Constitutional:  Negative for chills and fever.  Gastrointestinal:  Positive for constipation. Negative  for diarrhea.  Genitourinary:  Positive for pelvic pain and vaginal bleeding. Negative for dysuria.   Other systems negative  Physical Exam  Patient Vitals for the past 24 hrs:  BP Temp Pulse Resp SpO2 Height Weight  04/09/22 1948 114/72 -- 92 -- -- -- --  04/09/22 1943 -- 98.4 F (36.9 C) -- 17 100 % 5' 2.5" (1.588 m) 88.9 kg   Constitutional: Well-developed, well-nourished female in no acute distress.  Cardiovascular: normal rate  Respiratory: normal effort GI: Abd soft, non-tender, gravid appropriate for gestational age.   No rebound or guarding. MS: Extremities nontender, no edema, normal ROM Neurologic: Alert and oriented x 4.  GU: Neg CVAT.  PELVIC EXAM: Cervix pink, visually closed, without lesion, scant white creamy discharge, vaginal walls and external genitalia normal No blood seen in vault.   FHT:  149   Labs:   --/--/O NEG (06/25 2134)  Imaging:   2.7cm closed external os 0.5cm funneled internal os  MAU Course/MDM: I have reviewed the triage vital signs and the nursing notes.   Pertinent labs & imaging results that were available during my care of the patient were reviewed by me and considered in my medical decision making (see chart for details).      I have reviewed her medical records including past results, notes and treatments.   I have ordered labs and reviewed results.  [place] reviewed    Cervix noted to have 0.5cm funneling with 2.7cm closed length.  Consult Drs Elly Modena and Dillard with presentation, exam findings and test results. They recommend progesterone suppositories and office will arrange appointment with MD.  Treatments in MAU included SSE, [place].    Assessment: [place] IUP at [redacted]w[redacted]d Bleeding episode Funneling of cervix  Plan: Discharge home Rx Progesterone suppositories qhs (will give first one tonight) Pelvic rest Preterm Labor precautions and fetal kick counts Follow up in Office for prenatal visits  Encouraged to return if she develops  worsening of symptoms, increase in pain, fever, or other concerning symptoms.   Pt stable at time of discharge.  Hansel Feinstein CNM, MSN Certified Nurse-Midwife 04/09/2022 8:12 PM

## 2022-04-10 LAB — RH IG WORKUP (INCLUDES ABO/RH)
ABO/RH(D): O NEG
Antibody Screen: NEGATIVE
Gestational Age(Wks): 21
Unit division: 0

## 2022-04-16 ENCOUNTER — Other Ambulatory Visit: Payer: Self-pay | Admitting: Obstetrics & Gynecology

## 2022-04-16 DIAGNOSIS — Z363 Encounter for antenatal screening for malformations: Secondary | ICD-10-CM

## 2022-04-17 ENCOUNTER — Other Ambulatory Visit: Payer: Self-pay

## 2022-04-19 ENCOUNTER — Other Ambulatory Visit: Payer: Self-pay | Admitting: *Deleted

## 2022-04-19 ENCOUNTER — Ambulatory Visit: Payer: Medicaid Other | Admitting: *Deleted

## 2022-04-19 ENCOUNTER — Ambulatory Visit: Payer: Medicaid Other | Attending: Obstetrics and Gynecology

## 2022-04-19 ENCOUNTER — Encounter: Payer: Self-pay | Admitting: *Deleted

## 2022-04-19 ENCOUNTER — Ambulatory Visit: Payer: Medicaid Other | Attending: Obstetrics | Admitting: Obstetrics

## 2022-04-19 VITALS — BP 108/64 | HR 111

## 2022-04-19 DIAGNOSIS — Z363 Encounter for antenatal screening for malformations: Secondary | ICD-10-CM | POA: Diagnosis present

## 2022-04-19 DIAGNOSIS — O99842 Bariatric surgery status complicating pregnancy, second trimester: Secondary | ICD-10-CM | POA: Diagnosis not present

## 2022-04-19 DIAGNOSIS — Z6791 Unspecified blood type, Rh negative: Secondary | ICD-10-CM | POA: Insufficient documentation

## 2022-04-19 DIAGNOSIS — Z3A22 22 weeks gestation of pregnancy: Secondary | ICD-10-CM

## 2022-04-19 DIAGNOSIS — O26899 Other specified pregnancy related conditions, unspecified trimester: Secondary | ICD-10-CM

## 2022-04-19 DIAGNOSIS — O99212 Obesity complicating pregnancy, second trimester: Secondary | ICD-10-CM

## 2022-04-19 DIAGNOSIS — O09522 Supervision of elderly multigravida, second trimester: Secondary | ICD-10-CM

## 2022-04-19 DIAGNOSIS — O26872 Cervical shortening, second trimester: Secondary | ICD-10-CM

## 2022-04-19 NOTE — Progress Notes (Signed)
MFM Note  Kristen Padilla was seen for a detailed fetal anatomy scan due to maternal obesity and history of gastric bypass surgery.  She had a gastric sleeve performed about a year ago.  She has lost about 100 pounds since her surgery.  The patient was seen in the MAU about 10 days ago due to bleeding after straining for bowel movements.  A transvaginal ultrasound performed in the MAU showed that her cervix was 2.7 cm long with about 0.5 cm of funneling.    Due to this finding, she was started on daily vaginal progesterone.    The patient reports that she had a transvaginal cervical length measurement in your office 3 days ago that showed a cervical length of 3.5 cm long without any signs of funneling.  She denies feeling any contractions or lower abdominal cramping.    Her past pregnancy history includes a prior full-term vaginal delivery of an infant weighing over 9 pounds. Shoulder dystocia was encountered during that delivery.  She had a cell free DNA test earlier in her pregnancy which indicated a low risk for trisomy 27, 9, and 13. A female fetus is predicted.   Based on the fetal biometry measurements obtained today, her EDC was changed to August 28, 2021, making her 21 weeks and 2 days pregnant today.    The [facility] of August 28, 2021 is consistent with a first trimester ultrasound.  There were no obvious fetal anomalies noted on today's ultrasound exam.  However, the views of the fetal anatomy were limited today due to the fetal position.  The patient was informed that anomalies may be missed due to technical limitations. If the fetus is in a suboptimal position or maternal habitus is increased, visualization of the fetus in the maternal uterus may be impaired.  The following were discussed during today's consultation:  Sonographically detected shortened cervix  Her cervical length measured transabdominally today was 3.2 cm long without any signs of funneling.  The patient  declined to have a transvaginal ultrasound performed today as she just had one performed 2 days ago in your office.  The patient was reassured that based on the normal cervical length noted today along with her past obstetrical history, her risk of a preterm birth is low at this time.   She was advised to continue using the daily vaginal progesterone until at least 36 weeks.  History of gastric bypass surgery  The increased risk of fetal growth restriction in women who have a history of gastric bypass surgery was discussed.   As women who have had gastric bypass surgery may not tolerate the large glucose load necessary for the gestational diabetes screening test, an alternative strategy to screen for gestational diabetes may be to have her monitor her fingerstick values 4 times a day for a week.  Due to her history of gastric bypass surgery we will continue to follow her with growth ultrasounds throughout her pregnancy.  History of shoulder dystocia with her last delivery  The patient was advised that we will continue to assess the fetal weight later in her pregnancy.    A vaginal delivery may be attempted if this fetus measures smaller than her last child.    Alternatively, she understands that the only way to avoid another shoulder dystocia would be to have a cesarean delivery.  A follow-up exam was scheduled in 3 weeks to complete the views of the fetal anatomy.  We will reassess her cervical length again at that time.  The patient stated that all of her questions were answered today.  A total of 30 minutes was spent counseling and coordinating the care for this patient.  Greater than 50% of the time was spent in direct face-to-face contact.

## 2022-05-09 ENCOUNTER — Other Ambulatory Visit: Payer: Self-pay | Admitting: *Deleted

## 2022-05-09 ENCOUNTER — Ambulatory Visit: Payer: Medicaid Other | Admitting: *Deleted

## 2022-05-09 ENCOUNTER — Ambulatory Visit: Payer: Medicaid Other | Attending: Obstetrics and Gynecology

## 2022-05-09 VITALS — BP 119/63 | HR 87

## 2022-05-09 DIAGNOSIS — O26899 Other specified pregnancy related conditions, unspecified trimester: Secondary | ICD-10-CM | POA: Diagnosis present

## 2022-05-09 DIAGNOSIS — Z6791 Unspecified blood type, Rh negative: Secondary | ICD-10-CM | POA: Diagnosis present

## 2022-05-09 DIAGNOSIS — O09522 Supervision of elderly multigravida, second trimester: Secondary | ICD-10-CM | POA: Insufficient documentation

## 2022-05-09 DIAGNOSIS — R638 Other symptoms and signs concerning food and fluid intake: Secondary | ICD-10-CM

## 2022-05-09 DIAGNOSIS — Z9884 Bariatric surgery status: Secondary | ICD-10-CM

## 2022-05-09 DIAGNOSIS — O99212 Obesity complicating pregnancy, second trimester: Secondary | ICD-10-CM

## 2022-05-10 ENCOUNTER — Encounter (HOSPITAL_COMMUNITY): Payer: Self-pay | Admitting: Obstetrics & Gynecology

## 2022-05-10 ENCOUNTER — Inpatient Hospital Stay (HOSPITAL_COMMUNITY)
Admission: AD | Admit: 2022-05-10 | Discharge: 2022-05-10 | Disposition: A | Discharge status: Home / Self Care | Payer: Medicaid Other | Attending: Obstetrics & Gynecology | Admitting: Obstetrics & Gynecology

## 2022-05-10 ENCOUNTER — Inpatient Hospital Stay (HOSPITAL_COMMUNITY)
Admission: AD | Admit: 2022-05-10 | Discharge: 2022-05-10 | Disposition: A | Discharge status: Home / Self Care | Payer: Medicaid Other | Source: Home / Self Care | Attending: Obstetrics & Gynecology | Admitting: Obstetrics & Gynecology

## 2022-05-10 DIAGNOSIS — Z3A24 24 weeks gestation of pregnancy: Secondary | ICD-10-CM | POA: Insufficient documentation

## 2022-05-10 DIAGNOSIS — Z6791 Unspecified blood type, Rh negative: Secondary | ICD-10-CM | POA: Insufficient documentation

## 2022-05-10 DIAGNOSIS — O26892 Other specified pregnancy related conditions, second trimester: Secondary | ICD-10-CM | POA: Insufficient documentation

## 2022-05-10 DIAGNOSIS — O99342 Other mental disorders complicating pregnancy, second trimester: Secondary | ICD-10-CM | POA: Insufficient documentation

## 2022-05-10 DIAGNOSIS — W010XXA Fall on same level from slipping, tripping and stumbling without subsequent striking against object, initial encounter: Secondary | ICD-10-CM | POA: Diagnosis not present

## 2022-05-10 DIAGNOSIS — O99212 Obesity complicating pregnancy, second trimester: Secondary | ICD-10-CM | POA: Diagnosis present

## 2022-05-10 DIAGNOSIS — O9A212 Injury, poisoning and certain other consequences of external causes complicating pregnancy, second trimester: Secondary | ICD-10-CM | POA: Diagnosis not present

## 2022-05-10 DIAGNOSIS — O26899 Other specified pregnancy related conditions, unspecified trimester: Secondary | ICD-10-CM

## 2022-05-10 DIAGNOSIS — W1839XA Other fall on same level, initial encounter: Secondary | ICD-10-CM | POA: Insufficient documentation

## 2022-05-10 NOTE — MAU Provider Note (Signed)
History     CSN: [id number]  Arrival date and time: 05/10/22 1310   Event Date/Time   First Provider Initiated Contact with Patient 05/10/22 1545      Chief Complaint  Patient presents with   Fall   Kristen Padilla is a 31yo [id number] at [redacted]w[redacted]d who presents to the MAU for a mechanical fall that occurred at 1230pm today. The patient reports that she slipped on concrete and fell onto her left knee and left abdomen. She had initial sharp pains in her left abdomen, but reports that her pain has completely resolved. She was wearing a thick jacket at the time and feels it cushioned her fall. She denies any dizziness or lightheadedness. Denies loss of consciousness. Did not hit her head. Denies LOF or vaginal bleeding. Endorses fetal movements. She has an upcoming appointment with [facility] on 05/13/22.   Fall Pertinent negatives include no abdominal pain or headaches.    OB History     Gravida  5   Para  1   Term  1   Preterm      AB  3   Living  1      SAB  3   IAB      Ectopic      Multiple      Live Births  1           Past Medical History:  Diagnosis Date   Depression    Miscarriage    Morbid obesity (HCC)     Past Surgical History:  Procedure Laterality Date   HERNIA REPAIR     LAPAROSCOPIC GASTRIC SLEEVE RESECTION     Repair of displaced fracture of the left radius  03/2020    Family History  Problem Relation Age of Onset   Diabetes Mother    Hypertension Mother    Depression Mother    Hypertension Father     Social History   Tobacco Use   Smoking status: Never   Smokeless tobacco: Never  Vaping Use   Vaping Use: Never used  Substance Use Topics   Alcohol use: No   Drug use: No    Allergies:  Allergies  Allergen Reactions   Aripiprazole Rash   Sulfa Antibiotics Rash    Medications Prior to Admission  Medication Sig Dispense Refill Last Dose   Prenatal 28-0.8 MG TABS Take 1 tablet by mouth daily. 30 tablet 12  05/10/2022   progesterone (PROMETRIUM) 200 MG capsule Place 1 capsule (200 mg total) vaginally at bedtime. (Patient taking differently: Take 200 mg by mouth at bedtime.) 30 capsule 1 05/10/2022   buPROPion (WELLBUTRIN XL) 150 MG 24 hr tablet TAKE 1 TABLET BY MOUTH EVERY DAY IN THE MORNING (Patient not taking: Reported on 04/09/2022) 30 tablet 2 Unknown   buPROPion (WELLBUTRIN XL) 300 MG 24 hr tablet TAKE 1 TABLET (300 MG TOTAL) BY MOUTH EVERY MORNING. (Patient not taking: Reported on 04/09/2022) 90 tablet 1 Unknown   busPIRone (BUSPAR) 10 MG tablet TAKE 1 TABLET BY MOUTH THREE TIMES A DAY (Patient not taking: Reported on 09/11/2020) 270 tablet 1 Unknown   hydrOXYzine (ATARAX/VISTARIL) 10 MG tablet TAKE 1 TABLET BY MOUTH THREE TIMES A DAY AS NEEDED (Patient not taking: Reported on 04/09/2022) 90 tablet 2 Unknown   ondansetron (ZOFRAN-ODT) 4 MG disintegrating tablet Take 1 tablet (4 mg total) by mouth every 8 (eight) hours as needed for nausea or vomiting. (Patient not taking: Reported on 04/19/2022) 20 tablet 0 Unknown  terconazole (TERAZOL 7) 0.4 % vaginal cream Place 1 applicator vaginally at bedtime. Use for seven days (Patient not taking: Reported on 05/09/2022) 45 g 0 Unknown    Review of Systems  Constitutional: Negative.   Respiratory: Negative.    Cardiovascular: Negative.   Gastrointestinal:  Negative for abdominal pain.  Genitourinary:  Negative for vaginal bleeding.  Musculoskeletal:  Negative for joint swelling.  Neurological:  Negative for dizziness, syncope, light-headedness and headaches.   Physical Exam   Blood pressure 104/65, pulse 82, temperature 97.9 F (36.6 C), temperature source Oral, resp. rate 16, height 5' 2.5" (1.588 m), weight 91.2 kg, last menstrual period 11/13/2021, SpO2 97 %.  Physical Exam Constitutional:      General: She is not in acute distress.    Appearance: Normal appearance.  HENT:     Head: Atraumatic.  Eyes:     Extraocular Movements: Extraocular  movements intact.  Cardiovascular:     Rate and Rhythm: Normal rate and regular rhythm.  Pulmonary:     Effort: Pulmonary effort is normal.  Abdominal:     Tenderness: There is no abdominal tenderness.  Musculoskeletal:        General: Signs of injury (abrasion to L knee) present. No swelling or tenderness.     Cervical back: Normal range of motion.  Neurological:     Mental Status: She is alert and oriented to person, place, and time.     MAU Course  Procedures No results found for this or any previous visit (from the past 24 hour(s)).   MDM Continuous fetal monitoring for 4 hours post trauma until 4:30pm today.  Assessment and Plan  Traumatic Injury in Pregnancy   - Continuous fetal monitoring for 4 hours post fall - Counseled on fetal kick counts  [redacted] weeks gestational age  - Follow up with [facility]   -Discharge home in stable condition -VB, LOF and fall precautions discussed -Fetal kick count instructions given -Instructions on preventing traumatic injury in pregnancy given -Patient advised to follow-up with [facility] on Monday 05/13/22. -Patient may return to MAU as needed or if her condition were to change or worsen   Georgiann Hahn 05/10/2022, 3:52 PM

## 2022-05-10 NOTE — MAU Provider Note (Signed)
Patient returned for labwork = KB. Patient will be called tomorrow to inform her if she needs to return to MAU for Rhophylac injection. On-coming MAU provider will be notified.  Raelyn Mora, CNM

## 2022-05-10 NOTE — MAU Note (Signed)
Pt returned for blood work only.  CNM had called her and asked her to return for additional lab work. They will call her with the results. Pt does not need to wait for results.

## 2022-05-10 NOTE — MAU Note (Signed)
.  Kristen Padilla is a 31 y.o. at [redacted]w[redacted]d here in MAU reporting: at 12:30 she fel on her knee and hit he  left side of her abd. Had some cramping initially but not hurting now. Has felt baby move since fall. Denies any vag bleeding or leaking.  LMP:  Onset of complaint: 12:30 Pain score: 0 Vitals:   05/10/22 1409  BP: 113/62  Pulse: 90  Resp: 18  Temp: (!) 97.5 F (36.4 C)     FHT:156 Lab orders placed from triage:

## 2022-05-10 NOTE — MAU Provider Note (Signed)
History     CSN: [id number]  Arrival date and time: 05/10/22 1310   Event Date/Time   First Provider Initiated Contact with Patient 05/10/22 1545      Chief Complaint  Patient presents with   Fall   HPI Ms. BENISHA HADAWAY is a 31 y.o. year old [id number] female at [redacted]w[redacted]d weeks gestation who presents to MAU reporting she slipped and fell while walking down the driveway of her home around 1230 this afternoon. She reports she hit her LT knee and the LT side of her abdomen. She reports she had sharp pains on the LT side of her abdomen, but they resolved while she waited in the MAU lobby. She reports her fall was cushioned by her wearing a thick fluffy hoodie. She denies any dizziness, H/A, or LOC. She did not hit her head during the fall. She denies any pain now. She reports good (+) FM. She receives [facility] with [facility]; next appt is 05/13/2022. Her spouse is present and contributing to the history taking.    OB History     Gravida  5   Para  1   Term  1   Preterm      AB  3   Living  1      SAB  3   IAB      Ectopic      Multiple      Live Births  1           Past Medical History:  Diagnosis Date   Depression    Miscarriage    Morbid obesity (HCC)     Past Surgical History:  Procedure Laterality Date   HERNIA REPAIR     LAPAROSCOPIC GASTRIC SLEEVE RESECTION     Repair of displaced fracture of the left radius  03/2020    Family History  Problem Relation Age of Onset   Diabetes Mother    Hypertension Mother    Depression Mother    Hypertension Father     Social History   Tobacco Use   Smoking status: Never   Smokeless tobacco: Never  Vaping Use   Vaping Use: Never used  Substance Use Topics   Alcohol use: No   Drug use: No    Allergies:  Allergies  Allergen Reactions   Aripiprazole Rash   Sulfa Antibiotics Rash    Medications Prior to Admission  Medication Sig Dispense Refill Last Dose   Prenatal 28-0.8 MG TABS Take 1 tablet  by mouth daily. 30 tablet 12 05/10/2022   progesterone (PROMETRIUM) 200 MG capsule Place 1 capsule (200 mg total) vaginally at bedtime. (Patient taking differently: Take 200 mg by mouth at bedtime.) 30 capsule 1 05/10/2022   buPROPion (WELLBUTRIN XL) 150 MG 24 hr tablet TAKE 1 TABLET BY MOUTH EVERY DAY IN THE MORNING (Patient not taking: Reported on 04/09/2022) 30 tablet 2 Unknown   buPROPion (WELLBUTRIN XL) 300 MG 24 hr tablet TAKE 1 TABLET (300 MG TOTAL) BY MOUTH EVERY MORNING. (Patient not taking: Reported on 04/09/2022) 90 tablet 1 Unknown   busPIRone (BUSPAR) 10 MG tablet TAKE 1 TABLET BY MOUTH THREE TIMES A DAY (Patient not taking: Reported on 09/11/2020) 270 tablet 1 Unknown   hydrOXYzine (ATARAX/VISTARIL) 10 MG tablet TAKE 1 TABLET BY MOUTH THREE TIMES A DAY AS NEEDED (Patient not taking: Reported on 04/09/2022) 90 tablet 2 Unknown   ondansetron (ZOFRAN-ODT) 4 MG disintegrating tablet Take 1 tablet (4 mg total) by mouth every 8 (eight) hours as  needed for nausea or vomiting. (Patient not taking: Reported on 04/19/2022) 20 tablet 0 Unknown   terconazole (TERAZOL 7) 0.4 % vaginal cream Place 1 applicator vaginally at bedtime. Use for seven days (Patient not taking: Reported on 05/09/2022) 45 g 0 Unknown    Review of Systems  Constitutional: Negative.   HENT: Negative.    Eyes: Negative.   Respiratory: Negative.    Cardiovascular: Negative.   Gastrointestinal: Negative.   Endocrine: Negative.   Genitourinary: Negative.   Musculoskeletal: Negative.   Skin:        Thought there was a bruise on LT side of abd, but it is a vein  Allergic/Immunologic: Negative.   Neurological: Negative.   Hematological: Negative.   Psychiatric/Behavioral: Negative.     Physical Exam   Blood pressure 104/65, pulse 82, temperature 97.9 F (36.6 C), temperature source Oral, resp. rate 16, height 5' 2.5" (1.588 m), weight 91.2 kg, last menstrual period 11/13/2021, SpO2 97 %.  Physical Exam Vitals and  nursing note reviewed.  Constitutional:      Appearance: Normal appearance. She is obese.  HENT:     Head: Normocephalic and atraumatic.  Cardiovascular:     Rate and Rhythm: Normal rate.  Pulmonary:     Effort: Pulmonary effort is normal.  Abdominal:     General: Abdomen is flat.     Palpations: Abdomen is soft.  Musculoskeletal:        General: Normal range of motion.  Skin:    General: Skin is warm and dry.     Comments: No bruising noted on abdomen  Neurological:     Mental Status: She is alert and oriented to person, place, and time.  Psychiatric:        Mood and Affect: Mood normal.        Behavior: Behavior normal.        Thought Content: Thought content normal.        Judgment: Judgment normal.     MAU Course  Procedures  MDM Prolonged EFM x 2+ hours  Assessment and Plan  1. Traumatic injury during pregnancy in second trimester - Discharge patient - Information provided on preventing injuries in pregnancy and FKC - Discussed precautions to return to MAU for VB, LOF, UC's and no FM   2. [redacted] weeks gestation of pregnancy   - Keep scheduled appt with [facility] on Monday 05/13/2022 - Patient verbalized an understanding of the plan of care and agrees.    Raelyn Mora, CNM 05/10/2022, 3:46 PM

## 2022-05-11 ENCOUNTER — Telehealth: Payer: Self-pay | Admitting: Certified Nurse Midwife

## 2022-05-11 LAB — KLEIHAUER-BETKE STAIN
# Vials RhIg: 1
Fetal Cells %: 0 %
Quantitation Fetal Hemoglobin: 0 mL

## 2022-05-11 NOTE — Telephone Encounter (Signed)
Call placed to patient to discuss KB lab results from previous fall. 2 Patient identifiers collected prior to reading results. KB blood results normal. No additional Rhogam needed at this time. Patient verbalized understanding and has no further questions at this time.   Grey Rakestraw Danella Deis) Suzie Portela, MSN, CNM  [facility]  05/11/22 1:23 PM

## 2022-06-03 ENCOUNTER — Inpatient Hospital Stay (HOSPITAL_BASED_OUTPATIENT_CLINIC_OR_DEPARTMENT_OTHER): Payer: Medicaid Other

## 2022-06-03 ENCOUNTER — Inpatient Hospital Stay (HOSPITAL_COMMUNITY)
Admission: AD | Admit: 2022-06-03 | Discharge: 2022-06-03 | Disposition: A | Discharge status: Home / Self Care | Payer: Medicaid Other | Attending: Obstetrics and Gynecology | Admitting: Obstetrics and Gynecology

## 2022-06-03 ENCOUNTER — Encounter (HOSPITAL_COMMUNITY): Payer: Self-pay | Admitting: Obstetrics and Gynecology

## 2022-06-03 DIAGNOSIS — O26892 Other specified pregnancy related conditions, second trimester: Secondary | ICD-10-CM | POA: Diagnosis present

## 2022-06-03 DIAGNOSIS — Z6791 Unspecified blood type, Rh negative: Secondary | ICD-10-CM | POA: Diagnosis not present

## 2022-06-03 DIAGNOSIS — Z3A27 27 weeks gestation of pregnancy: Secondary | ICD-10-CM

## 2022-06-03 DIAGNOSIS — O4693 Antepartum hemorrhage, unspecified, third trimester: Secondary | ICD-10-CM

## 2022-06-03 DIAGNOSIS — O4692 Antepartum hemorrhage, unspecified, second trimester: Secondary | ICD-10-CM

## 2022-06-03 DIAGNOSIS — R1030 Lower abdominal pain, unspecified: Secondary | ICD-10-CM | POA: Diagnosis present

## 2022-06-03 DIAGNOSIS — O26899 Other specified pregnancy related conditions, unspecified trimester: Secondary | ICD-10-CM

## 2022-06-03 HISTORY — DX: Urinary tract infection, site not specified: N39.0

## 2022-06-03 LAB — CBC
HCT: 32.4 % — ABNORMAL LOW (ref 36.0–46.0)
Hemoglobin: 11.7 g/dL — ABNORMAL LOW (ref 12.0–15.0)
MCH: 31.6 pg (ref 26.0–34.0)
MCHC: 36.1 g/dL — ABNORMAL HIGH (ref 30.0–36.0)
MCV: 87.6 fL (ref 80.0–100.0)
Platelets: 315 10*3/uL (ref 150–400)
RBC: 3.7 MIL/uL — ABNORMAL LOW (ref 3.87–5.11)
RDW: 12 % (ref 11.5–15.5)
WBC: 10.8 10*3/uL — ABNORMAL HIGH (ref 4.0–10.5)
nRBC: 0 % (ref 0.0–0.2)

## 2022-06-03 LAB — KLEIHAUER-BETKE STAIN
# Vials RhIg: 1
Fetal Cells %: 0 %
Quantitation Fetal Hemoglobin: 0 mL

## 2022-06-03 MED ORDER — ACETAMINOPHEN 500 MG PO TABS
1000.0000 mg | ORAL_TABLET | Freq: Once | ORAL | Status: AC
  Administered 2022-06-03: 1000 mg via ORAL
  Filled 2022-06-03: qty 2

## 2022-06-03 MED ORDER — CYCLOBENZAPRINE HCL 5 MG PO TABS
10.0000 mg | ORAL_TABLET | Freq: Once | ORAL | Status: AC
  Administered 2022-06-03: 10 mg via ORAL
  Filled 2022-06-03: qty 2

## 2022-06-03 NOTE — MAU Note (Signed)
Kristen Padilla is a 31 y.o. at [redacted]w[redacted]d here in MAU reporting: when she woke up and used the bathroom this morning, "blood just kept coming out, can feel it now too." Passed a quarter sized clot.   Denies placental issues, low lying or previa.  Starting to cramp now.  Hasn't really felt him move, only felt him move once. Onset of complaint: 0630 Pain score: 8 Vitals:   06/03/22 0736  BP: 116/71  Pulse: 91  Resp: 20  Temp: 98.2 F (36.8 C)  SpO2: (!) 84%     FHT:142 Lab orders placed from triage:  none

## 2022-06-03 NOTE — MAU Provider Note (Cosign Needed Addendum)
History     CSN: [id number]  Arrival date and time: 06/03/22 0720   Event Date/Time   First Provider Initiated Contact with Patient 06/03/22 [phone]      Chief Complaint  Patient presents with   Vaginal Bleeding   Abdominal Pain   Vaginal Bleeding Associated symptoms include abdominal pain.  Abdominal Pain   Kristen Padilla is a .31 y.o. [id number] at [redacted]w[redacted]d who presents to MAU with chief complaint of heavy vaginal bleeding. Onset of complaint was around 0630 this morning. Patient reports passing a quarter-sized blood clot. She endorses ongoing active bleeding on CNM initial assessment. She is remote from sexual intercourse.  Patient also c/o lower abdominal cramping. This is a new problem,onset coincides with arrival in MAU. Pain score is 8/10. She has not taken medication for this complaint. She denies abdominal tenderness, dysuria, fever or recent illness.  Patient receives care with [facility].  OB History     Gravida  5   Para  1   Term  1   Preterm      AB  3   Living  1      SAB  3   IAB      Ectopic      Multiple      Live Births  1           Past Medical History:  Diagnosis Date   Depression    Miscarriage    Morbid obesity (HCC)     Past Surgical History:  Procedure Laterality Date   HERNIA REPAIR     LAPAROSCOPIC GASTRIC SLEEVE RESECTION     Repair of displaced fracture of the left radius  03/2020    Family History  Problem Relation Age of Onset   Diabetes Mother    Hypertension Mother    Depression Mother    Hypertension Father     Social History   Tobacco Use   Smoking status: Never   Smokeless tobacco: Never  Vaping Use   Vaping Use: Never used  Substance Use Topics   Alcohol use: No   Drug use: No    Allergies:  Allergies  Allergen Reactions   Aripiprazole Rash   Sulfa Antibiotics Rash    Medications Prior to Admission  Medication Sig Dispense Refill Last Dose   Prenatal 28-0.8 MG TABS Take 1 tablet  by mouth daily. 30 tablet 12     Review of Systems  Gastrointestinal:  Positive for abdominal pain.  Genitourinary:  Positive for vaginal bleeding.  All other systems reviewed and are negative.  Physical Exam   Blood pressure 116/71, pulse 91, temperature 98.2 F (36.8 C), temperature source Oral, resp. rate 20, height 5' 2.5" (1.588 m), weight 93 kg, last menstrual period 11/13/2021, SpO2 (!) 84 %.  Physical Exam Vitals and nursing note reviewed. Exam conducted with a chaperone present.  Constitutional:      Appearance: She is well-developed. She is obese.  HENT:     Head: Normocephalic.  Cardiovascular:     Rate and Rhythm: Normal rate.  Pulmonary:     Effort: Pulmonary effort is normal.  Abdominal:     Palpations: Abdomen is soft.     Comments: Gravid  Genitourinary:    Comments: Pelvic exam: External genitalia normal, vaginal walls pink and well rugated, cervix visually closed, no lesions noted. Portions of speculum which were in contact with vaginal tissue show scant glazing of diluted red-tinged vaginal discharge.   Skin:    General: Skin  is warm and dry.  Neurological:     Mental Status: She is alert and oriented to person, place, and time.  Psychiatric:        Mood and Affect: Mood normal.        Behavior: Behavior normal.      NST:  Baseline: 135 Variability: moderate Accels: 10x10 Decels: none Toco: none Reactive/Appropriate for GA   Results for orders placed or performed during the hospital encounter of 06/03/22 (from the past 24 hour(s))  CBC     Status: Abnormal   Collection Time: 06/03/22  8:01 AM  Result Value Ref Range   WBC 10.8 (H) 4.0 - 10.5 K/uL   RBC 3.70 (L) 3.87 - 5.11 MIL/uL   Hemoglobin 11.7 (L) 12.0 - 15.0 g/dL   HCT 74.2 (L) 59.5 - 63.8 %   MCV 87.6 80.0 - 100.0 fL   MCH 31.6 26.0 - 34.0 pg   MCHC 36.1 (H) 30.0 - 36.0 g/dL   RDW 75.6 43.3 - 29.5 %   Platelets 315 150 - 400 K/uL   nRBC 0.0 0.0 - 0.2 %     MAU Course   Procedures  MDM  --EMR reviewed. Normal cervical length on MFM ultrasound 05/09/2022 --S/p Rhogam administration 04/09/2022  Orders Placed This Encounter  Procedures   [place] MFM OB Limited   CBC   Kleihauer-Betke stain   Meds ordered this encounter  Medications   acetaminophen (TYLENOL) tablet 1,000 mg   cyclobenzaprine (FLEXERIL) tablet 10 mg   Report given to H. Mathews Robinsons, DNP, CNM who assumes care of patient at this time  Clayton Bibles, MSA, MSN, CNM Certified Nurse Midwife, Faculty Practice 06/03/22 8:00 AM  MFM Ultrasound: Comments:  This patient presented to the MAU due to vaginal bleeding.  A limited ultrasound performed today shows that the fetus is  in the vertex presentation.  There was normal amniotic fluid noted.  A normal-appearing anterior placenta is noted.  There were  no signs of placenta previa noted. ----------------------------------------------------------------------                   Ma Rings, MD Electronically Signed Final Report   06/03/2022 08:41 am    1029am: Patient reports pain is 0/10. No contractions tracing on the monitor. Reviewed [place] results with the patient. Will DC home and have her keep OB appt as planned.   Assessment and Plan   1. Rh negative, antepartum   2. Vaginal bleeding in pregnancy, third trimester   3. [redacted] weeks gestation of pregnancy    DC home in stable condition  KB still pending, this likely won't be back until later today/tonight. Patient had rhogam on 04/09/22 3rd Trimester precautions  Bleeding precautions PTL precautions  Fetal kick counts RX: none  Return to MAU as needed FU with OB as planned   Follow-up Information     [facility] Follow up.   Specialty: Obstetrics and Gynecology Contact information: [address] [place] [address] [place] [address] [phone]               Thressa Sheller DNP, CNM  06/03/22  10:34 AM

## 2022-06-13 ENCOUNTER — Other Ambulatory Visit: Payer: Self-pay | Admitting: *Deleted

## 2022-06-13 ENCOUNTER — Ambulatory Visit: Payer: Medicaid Other | Attending: Obstetrics

## 2022-06-13 ENCOUNTER — Ambulatory Visit: Payer: Medicaid Other | Admitting: *Deleted

## 2022-06-13 VITALS — BP 123/69 | HR 93

## 2022-06-13 DIAGNOSIS — O99212 Obesity complicating pregnancy, second trimester: Secondary | ICD-10-CM | POA: Insufficient documentation

## 2022-06-13 DIAGNOSIS — O99843 Bariatric surgery status complicating pregnancy, third trimester: Secondary | ICD-10-CM | POA: Diagnosis not present

## 2022-06-13 DIAGNOSIS — Z362 Encounter for other antenatal screening follow-up: Secondary | ICD-10-CM

## 2022-06-13 DIAGNOSIS — O09293 Supervision of pregnancy with other poor reproductive or obstetric history, third trimester: Secondary | ICD-10-CM | POA: Diagnosis not present

## 2022-06-13 DIAGNOSIS — Z9884 Bariatric surgery status: Secondary | ICD-10-CM | POA: Diagnosis present

## 2022-06-13 DIAGNOSIS — Z3A29 29 weeks gestation of pregnancy: Secondary | ICD-10-CM

## 2022-06-13 DIAGNOSIS — R638 Other symptoms and signs concerning food and fluid intake: Secondary | ICD-10-CM | POA: Insufficient documentation

## 2022-06-13 DIAGNOSIS — O99213 Obesity complicating pregnancy, third trimester: Secondary | ICD-10-CM

## 2022-06-16 ENCOUNTER — Inpatient Hospital Stay (EMERGENCY_DEPARTMENT_HOSPITAL)
Admission: AD | Admit: 2022-06-16 | Discharge: 2022-06-17 | Disposition: A | Discharge status: Home / Self Care | Payer: Medicaid Other | Source: Home / Self Care | Attending: Obstetrics and Gynecology | Admitting: Obstetrics and Gynecology

## 2022-06-16 ENCOUNTER — Encounter (HOSPITAL_COMMUNITY): Payer: Self-pay | Admitting: Obstetrics and Gynecology

## 2022-06-16 DIAGNOSIS — O99213 Obesity complicating pregnancy, third trimester: Secondary | ICD-10-CM | POA: Insufficient documentation

## 2022-06-16 DIAGNOSIS — O4693 Antepartum hemorrhage, unspecified, third trimester: Secondary | ICD-10-CM | POA: Insufficient documentation

## 2022-06-16 DIAGNOSIS — Z3A29 29 weeks gestation of pregnancy: Secondary | ICD-10-CM

## 2022-06-16 NOTE — MAU Note (Signed)
.  Kristen Padilla is a 31 y.o. at [redacted]w[redacted]d here in MAU reporting: spontaneous bright red vaginal bleeding- same bleeding 2 weeks ago without diagnosis(per pt). Pt states she is not having cramping or contractions. Wearing peri pad. Denies SROM. Reports + fetal movement.  Onset of complaint: 2200 Pain score: 0 Vitals:   06/16/22 2318  BP: 129/74  Pulse: 74  Resp: 18  Temp: 98 F (36.7 C)  SpO2: 100%     FHT:135bpm Lab orders placed from triage:  mau standing

## 2022-06-17 ENCOUNTER — Inpatient Hospital Stay (HOSPITAL_BASED_OUTPATIENT_CLINIC_OR_DEPARTMENT_OTHER): Payer: Medicaid Other

## 2022-06-17 ENCOUNTER — Encounter (HOSPITAL_COMMUNITY): Payer: Self-pay | Admitting: Obstetrics and Gynecology

## 2022-06-17 ENCOUNTER — Inpatient Hospital Stay (HOSPITAL_COMMUNITY)
Admission: AD | Admit: 2022-06-17 | Discharge: 2022-06-23 | DRG: 832 | Disposition: A | Discharge status: Home / Self Care | Payer: Medicaid Other | Attending: Obstetrics and Gynecology | Admitting: Obstetrics and Gynecology

## 2022-06-17 ENCOUNTER — Other Ambulatory Visit: Payer: Self-pay

## 2022-06-17 DIAGNOSIS — O458X3 Other premature separation of placenta, third trimester: Secondary | ICD-10-CM | POA: Diagnosis not present

## 2022-06-17 DIAGNOSIS — O24419 Gestational diabetes mellitus in pregnancy, unspecified control: Secondary | ICD-10-CM | POA: Diagnosis not present

## 2022-06-17 DIAGNOSIS — Z6791 Unspecified blood type, Rh negative: Secondary | ICD-10-CM

## 2022-06-17 DIAGNOSIS — O4693 Antepartum hemorrhage, unspecified, third trimester: Secondary | ICD-10-CM | POA: Diagnosis not present

## 2022-06-17 DIAGNOSIS — O24410 Gestational diabetes mellitus in pregnancy, diet controlled: Secondary | ICD-10-CM | POA: Diagnosis present

## 2022-06-17 DIAGNOSIS — O99213 Obesity complicating pregnancy, third trimester: Secondary | ICD-10-CM | POA: Diagnosis present

## 2022-06-17 DIAGNOSIS — O368330 Maternal care for abnormalities of the fetal heart rate or rhythm, third trimester, not applicable or unspecified: Secondary | ICD-10-CM | POA: Diagnosis present

## 2022-06-17 DIAGNOSIS — O26873 Cervical shortening, third trimester: Secondary | ICD-10-CM | POA: Diagnosis present

## 2022-06-17 DIAGNOSIS — O99013 Anemia complicating pregnancy, third trimester: Secondary | ICD-10-CM | POA: Diagnosis present

## 2022-06-17 DIAGNOSIS — F411 Generalized anxiety disorder: Secondary | ICD-10-CM | POA: Diagnosis present

## 2022-06-17 DIAGNOSIS — O4593 Premature separation of placenta, unspecified, third trimester: Principal | ICD-10-CM | POA: Diagnosis present

## 2022-06-17 DIAGNOSIS — O26893 Other specified pregnancy related conditions, third trimester: Secondary | ICD-10-CM | POA: Diagnosis present

## 2022-06-17 DIAGNOSIS — O99343 Other mental disorders complicating pregnancy, third trimester: Secondary | ICD-10-CM | POA: Diagnosis present

## 2022-06-17 DIAGNOSIS — Z3A29 29 weeks gestation of pregnancy: Secondary | ICD-10-CM | POA: Diagnosis not present

## 2022-06-17 DIAGNOSIS — O4692 Antepartum hemorrhage, unspecified, second trimester: Principal | ICD-10-CM | POA: Diagnosis present

## 2022-06-17 HISTORY — DX: Antepartum hemorrhage, unspecified, second trimester: O46.92

## 2022-06-17 LAB — CBC
HCT: 26.2 % — ABNORMAL LOW (ref 36.0–46.0)
HCT: 30.8 % — ABNORMAL LOW (ref 36.0–46.0)
Hemoglobin: 10.9 g/dL — ABNORMAL LOW (ref 12.0–15.0)
Hemoglobin: 8.8 g/dL — ABNORMAL LOW (ref 12.0–15.0)
MCH: 30.3 pg (ref 26.0–34.0)
MCH: 30.4 pg (ref 26.0–34.0)
MCHC: 33.6 g/dL (ref 30.0–36.0)
MCHC: 35.4 g/dL (ref 30.0–36.0)
MCV: 85.6 fL (ref 80.0–100.0)
MCV: 90.7 fL (ref 80.0–100.0)
Platelets: 300 10*3/uL (ref 150–400)
Platelets: 349 10*3/uL (ref 150–400)
RBC: 2.89 MIL/uL — ABNORMAL LOW (ref 3.87–5.11)
RBC: 3.6 MIL/uL — ABNORMAL LOW (ref 3.87–5.11)
RDW: 11.6 % (ref 11.5–15.5)
RDW: 11.9 % (ref 11.5–15.5)
WBC: 11.6 10*3/uL — ABNORMAL HIGH (ref 4.0–10.5)
WBC: 12.1 10*3/uL — ABNORMAL HIGH (ref 4.0–10.5)
nRBC: 0 % (ref 0.0–0.2)
nRBC: 0 % (ref 0.0–0.2)

## 2022-06-17 LAB — TYPE AND SCREEN
ABO/RH(D): O NEG
Antibody Screen: NEGATIVE

## 2022-06-17 LAB — KLEIHAUER-BETKE STAIN
# Vials RhIg: 1
Fetal Cells %: 0 %
Quantitation Fetal Hemoglobin: 0 mL

## 2022-06-17 LAB — GLUCOSE, CAPILLARY
Glucose-Capillary: 76 mg/dL (ref 70–99)
Glucose-Capillary: 110 mg/dL — ABNORMAL HIGH (ref 70–99)
Glucose-Capillary: 151 mg/dL — ABNORMAL HIGH (ref 70–99)

## 2022-06-17 MED ORDER — CALCIUM CARBONATE ANTACID 500 MG PO CHEW: 2.0000 | CHEWABLE_TABLET | ORAL | Status: DC | PRN

## 2022-06-17 MED ORDER — INSULIN ASPART 100 UNIT/ML IJ SOLN
0.0000 [IU] | Freq: Three times a day (TID) | INTRAMUSCULAR | Status: DC
  Administered 2022-06-17: 3 [IU] via SUBCUTANEOUS

## 2022-06-17 MED ORDER — BETAMETHASONE SOD PHOS & ACET 6 (3-3) MG/ML IJ SUSP
12.0000 mg | INTRAMUSCULAR | Status: AC
  Administered 2022-06-17 – 2022-06-18 (×2): 12 mg via INTRAMUSCULAR
  Filled 2022-06-17: qty 5

## 2022-06-17 MED ORDER — ACETAMINOPHEN 325 MG PO TABS
650.0000 mg | ORAL_TABLET | ORAL | Status: DC | PRN
  Administered 2022-06-19: 650 mg via ORAL
  Filled 2022-06-17: qty 2

## 2022-06-17 MED ORDER — LACTATED RINGERS IV SOLN: INTRAVENOUS | Status: DC

## 2022-06-17 MED ORDER — PRENATAL MULTIVITAMIN CH
1.0000 | ORAL_TABLET | Freq: Every day | ORAL | Status: DC
  Administered 2022-06-18: 1 via ORAL
  Filled 2022-06-17 (×3): qty 1

## 2022-06-17 MED ORDER — DOCUSATE SODIUM 100 MG PO CAPS
100.0000 mg | ORAL_CAPSULE | Freq: Every day | ORAL | Status: DC
  Administered 2022-06-17 – 2022-06-23 (×4): 100 mg via ORAL
  Filled 2022-06-17 (×7): qty 1

## 2022-06-17 MED ORDER — ZOLPIDEM TARTRATE 5 MG PO TABS: 5.0000 mg | ORAL_TABLET | Freq: Every evening | ORAL | Status: DC | PRN

## 2022-06-17 MED ORDER — LACTATED RINGERS IV SOLN: 125.0000 mL/h | INTRAVENOUS | Status: AC

## 2022-06-17 MED ORDER — RHO D IMMUNE GLOBULIN 1500 UNIT/2ML IJ SOSY
300.0000 ug | PREFILLED_SYRINGE | Freq: Once | INTRAMUSCULAR | Status: AC
  Administered 2022-06-17: 300 ug via INTRAMUSCULAR
  Filled 2022-06-17: qty 2

## 2022-06-17 NOTE — Discharge Instructions (Signed)
Return to MAU: If you have heavier bleeding that soaks through more that 2 pads per hour for an hour or more If you bleed so much that you feel like you might pass out or you do pass out If you have significant abdominal pain that is not improved with Tylenol 1000 mg every 8 hours as needed for pain If you develop a fever > 100.5  

## 2022-06-17 NOTE — H&P (Signed)
Kristen Padilla ANN LEOS TORRES is [birth year]  Patient's last menstrual period was 11/13/2021 (exact date). [redacted]w[redacted]d  pt presents with third episode of bleeding.  She was seen in [facility] last night and sent home to FU in the office today.  The bleeding did not stop.  She denies contractions or LOF. Good FM  Pt presents with Second trimester bleeding [O46.92]  Pregnancy complicated by Third episode of bleeding RH neg Depression Post traumatic disorder GDM Rubella NI H/o shoulder dystocia PMHX  Past Medical History:  Diagnosis Date   Depression    Miscarriage    Morbid obesity (HCC)    UTI (urinary tract infection)     PSURG HX  Past Surgical History:  Procedure Laterality Date   HERNIA REPAIR     LAPAROSCOPIC GASTRIC SLEEVE RESECTION     Repair of displaced fracture of the left radius  03/2020    Fam HX  Family History  Problem Relation Age of Onset   Diabetes Mother    Hypertension Mother    Depression Mother    Hypertension Father     Soc HX  Social History   Socioeconomic History   Marital status: Married    Spouse name: Not on file   Number of children: 1   Years of education: Not on file   Highest education level: High school graduate  Occupational History   Not on file  Tobacco Use   Smoking status: Never   Smokeless tobacco: Never  Vaping Use   Vaping Use: Never used  Substance and Sexual Activity   Alcohol use: No   Drug use: No   Sexual activity: Not Currently    Birth control/protection: None  Other Topics Concern   Not on file  Social History Narrative   Not on file   Social Determinants of Health   Financial Resource Strain: Low Risk  (06/05/2020)   Overall Financial Resource Strain (CARDIA)    Difficulty of Paying Living Expenses: Not very hard  Food Insecurity: No Food Insecurity (06/17/2022)   Hunger Vital Sign    Worried About Running Out of Food in the Last Year: Never true    Ran Out of Food in the Last Year: Never true  Transportation  Needs: No Transportation Needs (06/17/2022)   PRAPARE - Administrator, Civil Service (Medical): No    Lack of Transportation (Non-Medical): No  Physical Activity: Insufficiently Active (08/10/2020)   Exercise Vital Sign    Days of Exercise per Week: 2 days    Minutes of Exercise per Session: 50 min  Stress: Stress Concern Present (06/05/2020)   [employer] of Occupational Health - Occupational Stress Questionnaire    Feeling of Stress : Very much  Social Connections: Socially Isolated (06/05/2020)   Social Connection and Isolation Panel [NHANES]    Frequency of Communication with Friends and Family: Once a week    Frequency of Social Gatherings with Friends and Family: Once a week    Attends Religious Services: Never    Database administrator or Organizations: No    Attends Banker Meetings: Never    Marital Status: Married  Catering manager Violence: Not At Risk (06/17/2022)   Humiliation, Afraid, Rape, and Kick questionnaire    Fear of Current or Ex-Partner: No    Emotionally Abused: No    Physically Abused: No    Sexually Abused: No    ROS  Review of Systems -  see above  [place] SIUP vtx  position.  AFI16   EFW3-3#   16% CBC    Component Value Date/Time   WBC 12.1 (H) 06/17/2022 1550   RBC 2.89 (L) 06/17/2022 1550   HGB 8.8 (L) 06/17/2022 1550   HCT 26.2 (L) 06/17/2022 1550   PLT 349 06/17/2022 1550   MCV 90.7 06/17/2022 1550   MCH 30.4 06/17/2022 1550   MCHC 33.6 06/17/2022 1550   RDW 11.9 06/17/2022 1550   LYMPHSABS 2.9 11/16/2021 1710   MONOABS 0.6 11/16/2021 1710   EOSABS 0.2 11/16/2021 1710   BASOSABS 0.1 11/16/2021 1710     Gen WDWN  IN NAD CV RRR LUNGS CTAB ABD Gravid soft NT GU moderate vb.  Per VL exam WNL EXT neg Homans sign B A/P: IUP [redacted]w[redacted]d Thir trimester bleeidng Admit to antepartum [place] r/o abruption CBC done .   Pt is anemic and significant drop from yesterday.  Will do pad count and type and cross Pt RH neg received  rhogam MFM AND NEO CONSULT  GDM BS FOUR TIMES A DAY WITH ISS  BLEEDING HAS SLOWED WILL MONITOR FETAL STATUS REASSURING

## 2022-06-17 NOTE — MAU Provider Note (Signed)
History     CSN: [id number]  Arrival date and time: 06/16/22 2259   Event Date/Time   First Provider Initiated Contact with Patient 06/17/22 0000      Chief Complaint  Patient presents with   Vaginal Bleeding   Ms. MAGUADALUPE LATA is a 31 y.o. year old [id number] female at [redacted]w[redacted]d weeks gestation who presents to MAU reporting spontaneous BRB; "the same as 2 weeks ago." She states, "It just came out of nowhere. I was sitting watching tv when it happened." She denies any cramping, pain or LOF. She reports last SI was 3-4 wks ago. She reports good (+) FM. She receives [facility] with [facility]; next appt is 06/18/2022. Her father is present and contributing to the history taking.    OB History     Gravida  5   Para  1   Term  1   Preterm      AB  3   Living  1      SAB  3   IAB      Ectopic      Multiple      Live Births  1           Past Medical History:  Diagnosis Date   Depression    Miscarriage    Morbid obesity (HCC)    UTI (urinary tract infection)     Past Surgical History:  Procedure Laterality Date   HERNIA REPAIR     LAPAROSCOPIC GASTRIC SLEEVE RESECTION     Repair of displaced fracture of the left radius  03/2020    Family History  Problem Relation Age of Onset   Diabetes Mother    Hypertension Mother    Depression Mother    Hypertension Father     Social History   Tobacco Use   Smoking status: Never   Smokeless tobacco: Never  Vaping Use   Vaping Use: Never used  Substance Use Topics   Alcohol use: No   Drug use: No    Allergies:  Allergies  Allergen Reactions   Aripiprazole Rash   Sulfa Antibiotics Rash    Medications Prior to Admission  Medication Sig Dispense Refill Last Dose   Prenatal 28-0.8 MG TABS Take 1 tablet by mouth daily. 30 tablet 12 06/16/2022    Review of Systems  Constitutional: Negative.   HENT: Negative.    Eyes: Negative.   Respiratory: Negative.    Cardiovascular: Negative.    Gastrointestinal: Negative.   Endocrine: Negative.   Genitourinary:  Vaginal bleeding: BRB in toilet on on toilet paper x several wipes.  Musculoskeletal: Negative.   Skin: Negative.   Allergic/Immunologic: Negative.   Neurological: Negative.   Hematological: Negative.   Psychiatric/Behavioral: Negative.     Physical Exam   Blood pressure 110/70, pulse 88, temperature 98 F (36.7 C), temperature source Oral, resp. rate 18, height 5\' 2"  (1.575 m), weight 94 kg, last menstrual period 11/13/2021, SpO2 100 %.  Physical Exam Constitutional:      Appearance: Normal appearance. She is obese.  Cardiovascular:     Rate and Rhythm: Normal rate.  Pulmonary:     Effort: Pulmonary effort is normal.  Abdominal:     Palpations: Abdomen is soft.  Genitourinary:    General: Normal vulva.     Comments: Dilation: 1 Effacement (%): Thick Cervical Position: Posterior Station: Ballotable Presentation: Undeterminable Exam by:: [email or web address], CNM  Musculoskeletal:        General: Normal range of motion.  Skin:  General: Skin is warm and dry.  Neurological:     Mental Status: She is alert and oriented to person, place, and time.  Psychiatric:        Mood and Affect: Mood normal.        Behavior: Behavior normal.        Thought Content: Thought content normal.        Judgment: Judgment normal.    REACTIVE NST - FHR: 135 bpm / moderate variability / accels present / decels absent / TOCO: none MAU Course  Procedures  MDM EFM Speculum Exam OB MFM Limited U/S  *Consult with Dr. Charlotta Newton @ 0100 - notified of patient's complaints, assessments, lab & U/S results, tx plan call [facility] on-call MD to see if they want to admit for observation - agrees with plan, but "admission is a soft call and not absolutely necessary. If [facility] was upset about her not being admitted before, then recommending observation admission is not unreasonable."  TC to Dr. Su Hilt @ 0103 - notified of patient's complaints,  assessments, lab & U/S results, tx plan admit for observation - ok to admit for observation if desired by patient, agree with Dr. Charlotta Newton that it is not necessary, If patient of with going home, d/c home.  Assessment and Plan  1. Vaginal bleeding in pregnancy, third trimester - Information provided on vaginal bleeding in third trimester - Return to MAU: If you have heavier bleeding that soaks through more that 2 pads per hour for an hour or more If you bleed so much that you feel like you might pass out or you do pass out If you have significant abdominal pain that is not improved with Tylenol 1000 mg every 8 hours as needed for pain If you develop a fever > 100.5   2. [redacted] weeks gestation of pregnancy   - Discharge patient - Keep scheduled appt with [facility] on 06/18/2022 - Patient verbalized an understanding of the plan of care and agrees.   Raelyn Mora, CNM 06/17/2022, 12:00 AM

## 2022-06-17 NOTE — Progress Notes (Signed)
Called Dr. Normand Sloop and left voicemail re: patient's arrival on unit.

## 2022-06-18 DIAGNOSIS — O24419 Gestational diabetes mellitus in pregnancy, unspecified control: Secondary | ICD-10-CM

## 2022-06-18 DIAGNOSIS — O458X3 Other premature separation of placenta, third trimester: Secondary | ICD-10-CM

## 2022-06-18 DIAGNOSIS — Z3A29 29 weeks gestation of pregnancy: Secondary | ICD-10-CM | POA: Diagnosis not present

## 2022-06-18 DIAGNOSIS — O4693 Antepartum hemorrhage, unspecified, third trimester: Secondary | ICD-10-CM | POA: Diagnosis not present

## 2022-06-18 LAB — GLUCOSE, CAPILLARY
Glucose-Capillary: 104 mg/dL — ABNORMAL HIGH (ref 70–99)
Glucose-Capillary: 107 mg/dL — ABNORMAL HIGH (ref 70–99)
Glucose-Capillary: 108 mg/dL — ABNORMAL HIGH (ref 70–99)
Glucose-Capillary: 122 mg/dL — ABNORMAL HIGH (ref 70–99)
Glucose-Capillary: 124 mg/dL — ABNORMAL HIGH (ref 70–99)
Glucose-Capillary: 165 mg/dL — ABNORMAL HIGH (ref 70–99)
Glucose-Capillary: 84 mg/dL (ref 70–99)
Glucose-Capillary: 93 mg/dL (ref 70–99)

## 2022-06-18 LAB — RH IG WORKUP (INCLUDES ABO/RH)
ABO/RH(D): O NEG
Antibody Screen: NEGATIVE
Gestational Age(Wks): 29
Unit division: 0

## 2022-06-18 LAB — CBC
HCT: 31.9 % — ABNORMAL LOW (ref 36.0–46.0)
Hemoglobin: 10.9 g/dL — ABNORMAL LOW (ref 12.0–15.0)
MCH: 29.8 pg (ref 26.0–34.0)
MCHC: 34.2 g/dL (ref 30.0–36.0)
MCV: 87.2 fL (ref 80.0–100.0)
Platelets: 306 10*3/uL (ref 150–400)
RBC: 3.66 MIL/uL — ABNORMAL LOW (ref 3.87–5.11)
RDW: 11.8 % (ref 11.5–15.5)
WBC: 12.1 10*3/uL — ABNORMAL HIGH (ref 4.0–10.5)
nRBC: 0 % (ref 0.0–0.2)

## 2022-06-18 LAB — GROUP B STREP BY PCR: Group B strep by PCR: NEGATIVE

## 2022-06-18 LAB — HEMOGLOBIN A1C
Hgb A1c MFr Bld: 5.2 % (ref 4.8–5.6)
Mean Plasma Glucose: 103 mg/dL

## 2022-06-18 NOTE — Progress Notes (Addendum)
Hospital day # 1 pregnancy at [redacted]w[redacted]d--Pt was admitted on 12/18 for vaginal bleeding in third trimester at 29.5 weeks.   Patient Active Problem List   Diagnosis Date Noted   Second trimester bleeding 06/17/2022   Rh negative, antepartum 12/20/2021   PTSD (post-traumatic stress disorder) 06/12/2020   GAD (generalized anxiety disorder) 06/05/2020   Overdose 05/22/2020   Tylenol overdose, intentional self-harm, initial encounter (HCC) 05/21/2020   Leukocytosis 05/21/2020   Morbid obesity (HCC)    Depression      Active Ambulatory Problems    Diagnosis Date Noted   Tylenol overdose, intentional self-harm, initial encounter (HCC) 05/21/2020   Morbid obesity (HCC)    Depression    Leukocytosis 05/21/2020   Overdose 05/22/2020   GAD (generalized anxiety disorder) 06/05/2020   PTSD (post-traumatic stress disorder) 06/12/2020   Rh negative, antepartum 12/20/2021   Resolved Ambulatory Problems    Diagnosis Date Noted   No Resolved Ambulatory Problems   Past Medical History:  Diagnosis Date   Miscarriage    UTI (urinary tract infection)      S:  Pt endorses feeling good, monitor hurts her beely, but slept well, reviewed POC and pt to stay in pt for MFM and NICU consults today, reviewer seconds dose BMZ, and 24-48 hours post bleeding stop, pt endorses over last night had spotted a couple time of red blood other wise bleeding has slow to little, pt denies dizziness SOB or heart palpitations, +FM, denies leakage of fluid, fever, pain no abdominal tenderness, or foul oders.       Perception of contractions: none      Vaginal bleeding: spotting but much less      Vaginal discharge:  no significant change  O: BP 117/69 (BP Location: Left Arm)   Pulse (!) 103   Temp 97.6 F (36.4 C) (Oral)   Resp 17   Ht 5\' 2"  (1.575 m)   Wt 94 kg   LMP 11/13/2021 (Exact Date)   SpO2 98%   BMI 37.90 kg/m       Fetal tracings: Baseline 135s, moderate, +acels 15x15, -decel, reactive       Contractions:   none.      Gen WDWN  IN NAD      CV RRR      LUNGS CTAB      ABD Gravid soft NT      GU moderate vb.  Per VL exam WNL      EXT neg Homans sign B       Uterus non-tender      Extremities: no significant edema and no signs of DVT     CBG (last 3)  Recent Labs    06/18/22 0538 06/18/22 0654 06/18/22 0931  GLUCAP 104* 107* 108*    A: Hospital day # 1 pregnancy at [redacted]w[redacted]d--Pt was admitted on 12/18 for vaginal bleeding in third trimester at 29.5 weeks. This is pts 3 bleed in pregnancy, had large gush of bright red blood at home then while on toilet here, over the night 2 pads used only small spotting of red, -KB, RH-, received rhogam last recorded given 04/09/2022, HGB 10.9-8.8-10.9. WBC 12.1-12.1. afebrile. BMZ given 12/18 @ 1645. Pt  had GDM CBGs slight elevated but okay, no meds, being covered with insulin sliding scale with meals.  H/O depression with meds, mood stable. Pt currently improved/stable. NST reactive.   P: Continue current plan of care      Qshift NST  BMZ second dose due today at @1900       Last rhogam was 10/10 will discussed if pt should receive another dose.       Continue sliding scale for GDM of insulin with meal and CBGs fasting and with meals.       Med lock      MFM Consult orders placed      NICU consult orders placed      Consistent carb diet      Monitor Mood with h/o PTSD, MDD.      GBS PCR, GC/C Urine, OB UC all ordered.       Fasting and CBG with meals and to be covered with sliding scale insulin as needed      DR to be updated on pt status.   [facility] CNM, FNP-C, PMHNP-BC  [address]  [place], [place]  Cell: [phone]  Office Phone: [phone] Fax: [phone] 06/18/2022  9:53 AM

## 2022-06-18 NOTE — Inpatient Diabetes Management (Signed)
Inpatient Diabetes Program Recommendations  AACE/ADA: New Consensus Statement on Inpatient Glycemic Control (2015)  Target Ranges:  Prepandial:   less than 140 mg/dL      Peak postprandial:   less than 180 mg/dL (1-2 hours)      Critically ill patients:  140 - 180 mg/dL   Lab Results  Component Value Date   GLUCAP 84 06/18/2022   HGBA1C 5.2 06/17/2022    Review of Glycemic Control  Latest Reference Range & Units 06/17/22 20:07 06/17/22 22:02 06/18/22 00:34 06/18/22 05:38 06/18/22 06:54 06/18/22 09:31 06/18/22 12:21  Glucose-Capillary 70 - 99 mg/dL 364 (H) 383 (H) 779 (H) 104 (H) 107 (H) 108 (H) 84  (H): Data is abnormally high  Diabetes history: GDM (diagnosed on 12/18) Outpatient Diabetes medications: None Current orders for Inpatient glycemic control:  Novolog 0-15 units TID Betamethasone 12.5 mg on 06/17/22 & today  History of gastric sleeve in 2021 Admitted with vaginal bleeding   Inpatient Diabetes Program Recommendations:    Please consider Novolog 0-14 units TID 2 hrs after meals  Spoke with patient and significant other at bedside.  She states she was diagnosed with GDM on 12/18.  She states her 2 hour GTT was 180 mg/dL.  She has been admitted for vaginal bleeding.  She received 12.5 mg BMZ yesterday and will receive her second dose this afternoon.  Explained that this is a steroid and could result in elevated glucose.  Discussed BG goals fasting and 2 hrs postprandial.  Educated on CHO's, risks of elevated glucose in pregnancy, reviewed menu with her, discussed importance of eliminating caloric beverages.  She is only drinking water and states she is fine with that.  Explained that we will be checking her cbgs fasting and 2 hours postprandial.  We will add meal coverage as needed.    Discussed use of the Freestyle Libre 3 cgm.  She is interested in using this.  She states "I think that will help me manage my blood sugars".  Received verbal order for placing the Freestyle  Libre 3 on patient.  Placed on back of right arm.  Educated her on [place] 3.    Will continue to follow while inpatient.  Thank you, Dulce Sellar, MSN, CDCES Diabetes Coordinator Inpatient Diabetes Program [phone] (team pager from 8a-5p)

## 2022-06-18 NOTE — Consult Note (Addendum)
Consultation Service: Neonatology   Dr. Charlesetta Garibaldi has asked for consultation on Kristen Padilla regarding the care of a premature infant at [redacted]w[redacted]d Thank you for inviting [place] see this patient.   Reason for consult:  Explain the possible complications, the prognosis, and the care of a premature infant at 266and 6/7 weeks.  Chief complaint: 31y.o. female with a singleton female IUP named "Kristen Padilla with an estimated weight of 1435 grams (last 12/14). 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  Plan is for delivery via vaginal delivery if possible.  My key findings of this patient's HPI are:  I have reviewed the patient's chart and have met with her. The salient information is as follows:   [facility] given this is her 3rd instance of bleeding. Infant has looked good on stress testing and maternal cell counts stable. Bleeding has slowed although mom is concerned about risk of repeat bleeding. [facility] she should continue discussion with MFM regarding best plan for inpatient/outpatient monitoring given risk of bleeding and toddler at home.   Prenatal labs: see maternal H&P   Prenatal care:   good Pregnancy complications:  placental abruption (chronic), GDM, history of shoulder dystocia, Rubella non-immune,  Maternal antibiotics: This patient's mother is not on file. Maternal Steroids: Yes Most recent dose:  12/19 at [facility]  My recommendations for this patient and my actions included:   1. In the presence of the [place] the father of baby, I spent 20 minutes discussing the possible complications and outcomes of prematurity at this gestational age. I discussed specific complications at this gestational age referencing the need for resuscitation at birth due to respiratory  distress which may require mechanical ventilation, CPAP, and surfactant administration. In addition infant may require IV fluids pending establishment of enteral feeds (encouraged breast milk feeding), antibiotics for possible sepsis, temperature support, and continuous monitoring. I also discussed the potential risk of complications such as feeding difficulty (requiring gavage support), hyperbilirubinemia, hypoglycemia, need for temperature support, and risk of anemia given chronic abruption. I discussed this with parents in detail and they expressed an understanding of the risks and complications of prematurity.   2. I also discussed the expected survival of an infant born at 217 which is (Good). We further discussed that (Few) of the neonates born at this age have profound or severe neurological complications and school difficulties. In addition, (Some) of the neonates born at this age will have some for of mild to moderate neurological complications. She expressed an understanding of this information and also understands that these values depend on Kristen Coilbeing born in a controled fashion. If he were to be born outside a hospital with NICU services or after a significant amount of maternal bleeding, his survival chances and risk of neurodevelopmental impairment would be worse.    3. I informed her that the NICU team would be present at the delivery. She agreed that all appropriate medical measures could be taken to resuscitate her infant at the delivery. She also understood that our team will always be available for any questions that come up during their infant's hospitalization and we will continue to partner with their family to support them through this difficult time. Visitation policy was discussed and all questions were addressed.   Final  Impression:  31 y.o. female with a singleton female IUP named "Kristen Padilla" who is threatening to deliver and who now understands the possible complications and  prognosis of her infant. The mother agrees with plan for resuscitation and ICU care. Kristen Padilla's questions were answered. She is planning to try and provide breast milk for her infant.    ______________________________________________________________________  Thank you for asking [place] to participate in the care of this patient. Please do not hesitate to contact us again if you are aware of any further ways we can be of assistance.   Sincerely,  Edman Circle, MD Attending Neonatologist   I spent ~35 minutes in consultation time, of which 20 minutes was spent in direct face to face counseling.

## 2022-06-18 NOTE — Consult Note (Addendum)
MFM Consult Note Patient Name: Kristen Padilla [name]  Patient MRN:   [id number]  Referring provider: Jaymes Graff, MD  Reason for Consult: Third trimester bleeding   HPI: Kristen Padilla is a 31 y.o. [id number] at [redacted]w[redacted]d admitted for suspected placental abruption.   The patient was admitted for vaginal bleeding.  The patient has a history of a subchorionic hemorrhage early in the first trimester and then had no bleeding until about 21 weeks where she had spontaneous vaginal bleeding.  She was also found to have a short cervix os with funneling was placed on vaginal progesterone.  Sometime later she then developed what she calls an allergic reaction with vaginal swelling and irritation.  She stopped the progesterone but then had vaginal bleeding around 27 weeks.  This bleeding event was unprovoked and then resolved spontaneously.  Yesterday she went to the MAU for vaginal bleeding and was discharged home but then returned after the bleeding got worse.  She is now admitted for suspected placental abruption.  An ultrasound was performed which did not show sonographic evidence of placental hemorrhage.  She is hemodynamically stable.  Her hemoglobin is stable around 10.  She has been given RhoGAM for Rh- status.  She reports that she denies chest pain, shortness of breath, dizziness, fatigue or presyncopal episodes.  I discussed my concern for possible placental abruption as well as the need to remain inpatient to closely monitor her and her fetus.  I discussed we need to balance the potential consequence of a early delivery with neonatal complications of prematurity versus a potential stillbirth by delaying delivery.  She verbalized understanding and is agreeable to the plan outlined below.  Review of Systems: A review of systems was performed and was negative except per HPI   Vitals and Physical Exam Vitals:   06/18/22 0751 06/18/22 1224  BP: 117/69 115/64  Pulse: (!) 103 74  Resp:  17 17  Temp: 97.6 F (36.4 C) 97.9 F (36.6 C)  SpO2: 98% 99%     Sitting comfortably on the sonogram table Nonlabored breathing Normal rate and rhythm Abdomen is nontender  Genetic testing: low risk NIPS  Assessment - Partial placental abruption, second major bleeding event in the viable peroid (>22 weeks) - Gestational diabetes - Short cervix Plan - BMZ course has been started - Magnesium sulfate is only indicated if preterm birth is likely to occur or indicated at less than [redacted] weeks gestation - NICU consultation if not done - Continuous fetal monitoring with any ongoing bleeding. Once stable consider NST 2-3 times per day.  - Weekly ultrasound to assess the placenta for possible visualization of placental abruption - Continue to give Rhogam as needed with KB test to quantify the dose needed - CBC and coags should be performed at least 2-3 times per week to assess for coagulopathy and internal bleeding - Maintain IV in place for rapid infusion of crystalloid or blood products when needed - Monitor for signs of PPROM since placental abruption increases this risk - Serial growth ultrasound every 4 weeks - Due to gestational diabetes continue to check blood sugars fasting and postprandial - Vaginal progesterone can be discontinued since she is bleeding   - Delivery likely around 35-37 weeks but may be sooner if indicated - If she remains clinically stable in 5-7 days we can reassess for possible discharge with close follow-up   I spent 60 minutes reviewing the patients chart, including labs and images as well as counseling  the patient about her medical conditions.  Valeda Malm  MFM, [place]   06/18/2022  11:48 AM

## 2022-06-19 ENCOUNTER — Encounter (HOSPITAL_COMMUNITY): Payer: Self-pay | Admitting: Obstetrics and Gynecology

## 2022-06-19 LAB — CULTURE, OB URINE

## 2022-06-19 LAB — RAPID URINE DRUG SCREEN, HOSP PERFORMED
Amphetamines: NOT DETECTED
Barbiturates: NOT DETECTED
Benzodiazepines: NOT DETECTED
Cocaine: NOT DETECTED
Opiates: NOT DETECTED
Tetrahydrocannabinol: NOT DETECTED

## 2022-06-19 LAB — GC/CHLAMYDIA PROBE AMP (~~LOC~~) NOT AT ARMC
Chlamydia: NEGATIVE
Comment: NEGATIVE
Comment: NORMAL
Neisseria Gonorrhea: NEGATIVE

## 2022-06-19 LAB — GLUCOSE, CAPILLARY: Glucose-Capillary: 101 mg/dL — ABNORMAL HIGH (ref 70–99)

## 2022-06-19 MED ORDER — INSULIN ASPART 100 UNIT/ML IJ SOLN: 0.0000 [IU] | Freq: Three times a day (TID) | INTRAMUSCULAR | Status: DC

## 2022-06-19 MED ORDER — SODIUM CHLORIDE 0.9% FLUSH
3.0000 mL | Freq: Two times a day (BID) | INTRAVENOUS | Status: DC
  Administered 2022-06-19 – 2022-06-23 (×8): 3 mL via INTRAVENOUS

## 2022-06-19 MED ORDER — INSULIN ASPART 100 UNIT/ML IJ SOLN
0.0000 [IU] | Freq: Three times a day (TID) | INTRAMUSCULAR | Status: DC
  Administered 2022-06-20: 2 [IU] via SUBCUTANEOUS
  Administered 2022-06-21: 1 [IU] via SUBCUTANEOUS

## 2022-06-19 MED ORDER — LACTATED RINGERS IV BOLUS
1000.0000 mL | Freq: Once | INTRAVENOUS | Status: AC
  Administered 2022-06-19: 1000 mL via INTRAVENOUS

## 2022-06-19 NOTE — Progress Notes (Signed)
In to room to assess patient after called by primary RN regarding new onset pain in the upper abdomen. Previously reported this pain an 8/10 but is now 5-6/10 after taking Tylenol. The pain is above the umbilicus to the left of the abdomen. The pain is sharp and intermittent. She has not felt this pain previously. Bleeding remains minimal but bright red with wiping. Has been hydrating today. Has slept/rested most of the day. She states she has used Flexeril two weeks ago and does not like how it makes her feel.  BP 105/64 (BP Location: Left Arm)   Pulse 78   Temp 97.6 F (36.4 C) (Oral)   Resp 16   Ht 5\' 2"  (1.575 m)   Wt 94 kg   LMP 11/13/2021 (Exact Date)   SpO2 100%   BMI 37.90 kg/m  Gen: NAD, resting in bed Abd: Soft, gravid, mild tenderness at the left fundus and left side of the uterus, no rebound/guarding, no tenderness on the right side of the uterus, possible mild contraction palpated but difficult to palpate uterus due to habitus Ext: No bilateral calf tenderness  FHT: 125bpm, moderate variability, 15x15 accel, no decels Toco: quiet  A/P: 11/15/2021 at [redacted]w[redacted]d admitted for vaginal bleeding due chronic partial abruption, now with new onset abdominal pain.  Suspect new onset pain is likely due to preterm contractions secondary to partial abruption (not graphing on toco) vs MSK pain vs round ligament pain. Exam is significant for mild tenderness at the fundus and on the left side of the uterus, no rebound/guarding. Patient overall appears comfortable at rest and bleeding remains minimal. Pain has decreased with Tylenol 650mg  given at 2108. Patient declines Flexeril. Repeat vital signs are at baseline and are unremarkable. Fetal heart tracing remains reassuring and no contractions are graphing on toco. Recommend 1L IVF bolus and repeat CBC and coagulopathy panel. Primary RN to obtain clicker so that patient's pain can be tracked on FHT/toco. Will continue to monitor.  , DO

## 2022-06-19 NOTE — Progress Notes (Addendum)
Patient ID: Irven Coe [name], female   DOB: Sep 01, 1990, 31 y.o.   MRN: [id number] Kailene Steinhart ANN LEOS [name] is a 31 y.o. K2H0623 at [redacted]w[redacted]d admitted for recurrent vaginal bleeding  Hospital Day No: 3  Subjective: Minimal dark spotting.  Denies ctxs, LOF and reports good FM.  Objective: BP 103/60 (BP Location: Right Arm)   Pulse 80   Temp 98.4 F (36.9 C) (Oral)   Resp 17   Ht 5\' 2"  (1.575 m)   Wt 94 kg   LMP 11/13/2021 (Exact Date)   SpO2 100%   BMI 37.90 kg/m  I/O last 3 completed shifts: In: 1141.9 [I.V.:1141.9] Out: -  No intake/output data recorded. Fasting CBG 101  Physical Exam:  Gen: alert and no distress Chest/Lungs: cta bilaterally  Heart/Pulse: RRR  Abdomen: soft, gravid, nontender Uterine fundus: soft, nontender Skin & Color: warm and dry  Neurological: AOx3, DTRs 2+ EXT: negative Homan's b/l, edema none  FHT:  FHR: 125 bpm, variability: moderate,  accelerations:  Present,  decelerations:  Absent UC:   none SVE:    deferred  Labs: Lab Results  Component Value Date   WBC 12.1 (H) 06/18/2022   HGB 10.9 (L) 06/18/2022   HCT 31.9 (L) 06/18/2022   MCV 87.2 06/18/2022   PLT 306 06/18/2022    Assessment and Plan: has Tylenol overdose, intentional self-harm, initial encounter (HCC); Morbid obesity (HCC); Depression; Leukocytosis; Overdose; GAD (generalized anxiety disorder); PTSD (post-traumatic stress disorder); Rh negative, antepartum; and Second trimester bleeding on their problem list.  Partial Abruption - stable bleeding.  Per MFM consult will order urine toxicology.  MFM Recs - BMZ course has been started - Magnesium sulfate is only indicated if preterm birth is likely to occur or indicated at less than [redacted] weeks gestation - NICU consultation if not done - Continuous fetal monitoring with any ongoing bleeding. Once stable consider NST 2-3 times per day.  - Weekly ultrasound to assess the placenta for possible visualization of placental  abruption - Continue to give Rhogam as needed with KB test to quantify the dose needed - CBC and coags should be performed at least 2-3 times per week to assess for coagulopathy and internal bleeding - Maintain IV in place for rapid infusion of crystalloid or blood products when needed - Monitor for signs of PPROM since placental abruption increases this risk - Serial growth ultrasound every 4 weeks - Due to gestational diabetes continue to check blood sugars fasting and postprandial - Vaginal progesterone can be discontinued since she is bleeding   - Delivery likely around 35-37 weeks but may be sooner if indicated - If she remains clinically stable in 5-7 days we can reassess for possible discharge with close follow-up   GDMA1 - SSI and CBGs 4x/day. SCDs for DVT prophylaxis Neonatology consult done Cont observation.  06/20/2022 06/19/2022, 9:38 AM  Called by RN at 12:35p with report of pt noticing bright red blood upon wiping.  RN reports tracing remains cat 1.  No ctxs.  Will continue observation and change to continuous monitoring for now.  1600 Tracing reviewed remotely by me cat 1, no ctxs. Urine drug screen negative.

## 2022-06-20 LAB — GLUCOSE, CAPILLARY
Glucose-Capillary: 90 mg/dL (ref 70–99)
Glucose-Capillary: 93 mg/dL (ref 70–99)
Glucose-Capillary: 136 mg/dL — ABNORMAL HIGH (ref 70–99)
Glucose-Capillary: 123 mg/dL — ABNORMAL HIGH (ref 70–99)

## 2022-06-20 LAB — PROTIME-INR
INR: 1 (ref 0.8–1.2)
Prothrombin Time: 13.5 seconds (ref 11.4–15.2)

## 2022-06-20 LAB — CBC
HCT: 28.8 % — ABNORMAL LOW (ref 36.0–46.0)
Hemoglobin: 10 g/dL — ABNORMAL LOW (ref 12.0–15.0)
MCH: 30.5 pg (ref 26.0–34.0)
MCHC: 34.7 g/dL (ref 30.0–36.0)
MCV: 87.8 fL (ref 80.0–100.0)
Platelets: 275 10*3/uL (ref 150–400)
RBC: 3.28 MIL/uL — ABNORMAL LOW (ref 3.87–5.11)
RDW: 11.8 % (ref 11.5–15.5)
WBC: 11.8 10*3/uL — ABNORMAL HIGH (ref 4.0–10.5)
nRBC: 0 % (ref 0.0–0.2)

## 2022-06-20 LAB — TYPE AND SCREEN
ABO/RH(D): O NEG
Antibody Screen: POSITIVE

## 2022-06-20 LAB — APTT: aPTT: 22 seconds — ABNORMAL LOW (ref 24–36)

## 2022-06-20 LAB — FIBRINOGEN: Fibrinogen: 433 mg/dL (ref 210–475)

## 2022-06-20 NOTE — Hospital Course (Signed)
Pt without complaints.  No leakage of fluid and VB has slowed downGood FM  BP 103/60 (BP Location: Left Arm)   Pulse 79   Temp 98 F (36.7 C) (Oral)   Resp 16   Ht 5\' 2"  (1.575 m)   Wt 94 kg   LMP 11/13/2021 (Exact Date)   SpO2 95%   BMI 37.90 kg/m   FHTS Baseline: 145 bpm and Accelerations: Reactive  Toco none  Pt in NAD CV RRR Lungs CTAB abd  Gravid soft and NT GU no vb EXt no calf tenderness Results for orders placed or performed during the hospital encounter of 06/17/22 (from the past 72 hour(s))  Glucose, capillary     Status: Abnormal   Collection Time: 06/17/22  8:07 PM  Result Value Ref Range   Glucose-Capillary 110 (H) 70 - 99 mg/dL    Comment: Glucose reference range applies only to samples taken after fasting for at least 8 hours.   Comment 1 Notify RN   Glucose, capillary     Status: Abnormal   Collection Time: 06/17/22 10:02 PM  Result Value Ref Range   Glucose-Capillary 151 (H) 70 - 99 mg/dL    Comment: Glucose reference range applies only to samples taken after fasting for at least 8 hours.   Comment 1 Notify RN   Glucose, capillary     Status: Abnormal   Collection Time: 06/18/22 12:34 AM  Result Value Ref Range   Glucose-Capillary 165 (H) 70 - 99 mg/dL    Comment: Glucose reference range applies only to samples taken after fasting for at least 8 hours.   Comment 1 Notify RN   CBC     Status: Abnormal   Collection Time: 06/18/22  5:15 AM  Result Value Ref Range   WBC 12.1 (H) 4.0 - 10.5 K/uL   RBC 3.66 (L) 3.87 - 5.11 MIL/uL   Hemoglobin 10.9 (L) 12.0 - 15.0 g/dL   HCT 06/20/22 (L) 10.9 - 32.3 %   MCV 87.2 80.0 - 100.0 fL   MCH 29.8 26.0 - 34.0 pg   MCHC 34.2 30.0 - 36.0 g/dL   RDW 55.7 32.2 - 02.5 %   Platelets 306 150 - 400 K/uL   nRBC 0.0 0.0 - 0.2 %    Comment: Performed at [facility], [address], [place], [place]  Glucose, capillary     Status: Abnormal   Collection Time: 06/18/22  5:38 AM  Result Value Ref Range    Glucose-Capillary 104 (H) 70 - 99 mg/dL    Comment: Glucose reference range applies only to samples taken after fasting for at least 8 hours.  Glucose, capillary     Status: Abnormal   Collection Time: 06/18/22  6:54 AM  Result Value Ref Range   Glucose-Capillary 107 (H) 70 - 99 mg/dL    Comment: Glucose reference range applies only to samples taken after fasting for at least 8 hours.   Comment 1 Notify RN   Glucose, capillary     Status: Abnormal   Collection Time: 06/18/22  9:31 AM  Result Value Ref Range   Glucose-Capillary 108 (H) 70 - 99 mg/dL    Comment: Glucose reference range applies only to samples taken after fasting for at least 8 hours.  GC/Chlamydia probe amp ([place])not at [facility]     Status: None   Collection Time: 06/18/22 10:06 AM  Result Value Ref Range   Neisseria Gonorrhea Negative    Chlamydia Negative  Comment Normal Reference [place] Chlamydia - Negative    Comment      Normal Reference Range Neisseria Gonorrhea - Negative  Culture, OB Urine     Status: Abnormal   Collection Time: 06/18/22 10:06 AM   Specimen: Urine, Random  Result Value Ref Range   Specimen Description URINE, RANDOM    Special Requests NONE    Culture (A)     MULTIPLE SPECIES PRESENT, SUGGEST RECOLLECTION NO GROUP B STREP (S.AGALACTIAE) ISOLATED Performed at [facility], [address], [place], [place] [address]    Report Status 06/19/2022 FINAL   Group B strep by PCR     Status: None   Collection Time: 06/18/22 10:31 AM   Specimen: Vaginal/Rectal; Genital  Result Value Ref Range   Group B strep by PCR NEGATIVE NEGATIVE    Comment: (NOTE) Intrapartum testing with Xpert GBS assay should be used as an adjunct to other methods available and not used to replace antepartum testing (at 35-[redacted] weeks gestation). Performed at [facility], [address] [place], [place] [address]   Glucose, capillary     Status: None   Collection Time: 06/18/22 12:21 PM  Result Value Ref  Range   Glucose-Capillary 84 70 - 99 mg/dL    Comment: Glucose reference range applies only to samples taken after fasting for at least 8 hours.  Glucose, capillary     Status: Abnormal   Collection Time: 06/18/22  2:27 PM  Result Value Ref Range   Glucose-Capillary 124 (H) 70 - 99 mg/dL    Comment: Glucose reference range applies only to samples taken after fasting for at least 8 hours.  Glucose, capillary     Status: None   Collection Time: 06/18/22  5:02 PM  Result Value Ref Range   Glucose-Capillary 93 70 - 99 mg/dL    Comment: Glucose reference range applies only to samples taken after fasting for at least 8 hours.  Glucose, capillary     Status: Abnormal   Collection Time: 06/18/22 10:25 PM  Result Value Ref Range   Glucose-Capillary 122 (H) 70 - 99 mg/dL    Comment: Glucose reference range applies only to samples taken after fasting for at least 8 hours.  Glucose, capillary     Status: Abnormal   Collection Time: 06/19/22  6:41 AM  Result Value Ref Range   Glucose-Capillary 101 (H) 70 - 99 mg/dL    Comment: Glucose reference range applies only to samples taken after fasting for at least 8 hours.   Comment 1 Notify RN   Rapid urine drug screen (hospital performed)     Status: None   Collection Time: 06/19/22 11:54 AM  Result Value Ref Range   Opiates NONE DETECTED NONE DETECTED   Cocaine NONE DETECTED NONE DETECTED   Benzodiazepines NONE DETECTED NONE DETECTED   Amphetamines NONE DETECTED NONE DETECTED   Tetrahydrocannabinol NONE DETECTED NONE DETECTED   Barbiturates NONE DETECTED NONE DETECTED    Comment: (NOTE) DRUG SCREEN FOR MEDICAL PURPOSES ONLY.  IF CONFIRMATION IS NEEDED FOR ANY PURPOSE, NOTIFY LAB WITHIN 5 DAYS.  LOWEST DETECTABLE LIMITS FOR URINE DRUG SCREEN Drug Class                     Cutoff (ng/mL) Amphetamine and metabolites    1000 Barbiturate and metabolites    200 Benzodiazepine                 200 Opiates and metabolites  300 Cocaine and  metabolites        300 THC                            50 Performed at [facility], [address], [place], [place] [address]   Type and screen [facility]     Status: None   Collection Time: 06/19/22 11:15 PM  Result Value Ref Range   ABO/RH(D) O NEG    Antibody Screen POS    Sample Expiration 06/22/2022,2359    Antibody Identification      PASSIVELY ACQUIRED ANTI-D Performed at [facility], [address] [place], [place] [address]   CBC     Status: Abnormal   Collection Time: 06/19/22 11:19 PM  Result Value Ref Range   WBC 11.8 (H) 4.0 - 10.5 K/uL   RBC 3.28 (L) 3.87 - 5.11 MIL/uL   Hemoglobin 10.0 (L) 12.0 - 15.0 g/dL   HCT 09.3 (L) 26.7 - 12.4 %   MCV 87.8 80.0 - 100.0 fL   MCH 30.5 26.0 - 34.0 pg   MCHC 34.7 30.0 - 36.0 g/dL   RDW 58.0 99.8 - 33.8 %   Platelets 275 150 - 400 K/uL   nRBC 0.0 0.0 - 0.2 %    Comment: Performed at [facility], [address] [place], [place] [address]  Fibrinogen (coagulopathy lab panel)     Status: None   Collection Time: 06/19/22 11:19 PM  Result Value Ref Range   Fibrinogen 433 210 - 475 mg/dL    Comment: (NOTE) Fibrinogen results may be underestimated in patients receiving thrombolytic therapy. Performed at [facility], [address] [place], [place] [address]   Protime-INR (coagulopathy lab panel)     Status: None   Collection Time: 06/19/22 11:19 PM  Result Value Ref Range   Prothrombin Time 13.5 11.4 - 15.2 seconds   INR 1.0 0.8 - 1.2    Comment: (NOTE) INR goal varies based on device and disease states. Performed at [facility], [address] [place], [place] [address]   APTT (coagulopathy lab panel)     Status: Abnormal   Collection Time: 06/19/22 11:19 PM  Result Value Ref Range   aPTT 22 (L) 24 - 36 seconds    Comment: Performed at [facility], [address] [place], [place] [address]  Glucose, capillary     Status: None   Collection Time: 06/20/22   9:34 AM  Result Value Ref Range   Glucose-Capillary 90 70 - 99 mg/dL    Comment: Glucose reference range applies only to samples taken after fasting for at least 8 hours.  Glucose, capillary     Status: None   Collection Time: 06/20/22  4:28 PM  Result Value Ref Range   Glucose-Capillary 93 70 - 99 mg/dL    Comment: Glucose reference range applies only to samples taken after fasting for at least 8 hours.    Assessment and Plan [redacted]w[redacted]d  Third trimester bleeding.   Bleeding is getting better  Fetal monitoring reassuring Monitor closely

## 2022-06-20 NOTE — Progress Notes (Signed)
Antepartum Progress Note  Subjective: Patient reports feeling significantly better than she did last night after IVF bolus. Has been able to rest/sleep. She was unaware of FHR deceleration. Has laid on her back mostly as this is the easiest way for fetal monitoring. Has not had any vaginal bleeding.  Denies fevers, chills, chest pain, visual changes, SOB, RUQ/epigastric pain, N/V, dysuria, hematuria, or sudden onset/worsening bilateral LE or facial edema.  Objective: BP 103/63 (BP Location: Left Arm)   Pulse 73   Temp 97.9 F (36.6 C) (Oral)   Resp 16   Ht 5\' 2"  (1.575 m)   Wt 94 kg   LMP 11/13/2021 (Exact Date)   SpO2 99%   BMI 37.90 kg/m  Gen:  NAD, pleasant and cooperative Cardio:  RRR Pulm:  CTAB, no wheezes/rales/rhonchi Abd:  Soft, gravid, non-distended, non-tender throughout, no rebound/guarding (significant improvement in exam from last night) Ext:  No bilateral LE edema, no bilateral calf tenderness  FHT: currently 135bpm, moderate variability, + accel, - decel FHT (approximately 0530): baseline 135bpm, moderate variability, + accel, 5 minute fetal heart rate deceleration with its nadir at 45 and rise back to baseline in the 110s Toco: None  Results for orders placed or performed during the hospital encounter of 06/17/22  Group B strep by PCR   Specimen: Vaginal/Rectal; Genital  Result Value Ref Range   Group B strep by PCR NEGATIVE NEGATIVE  Culture, OB Urine   Specimen: Urine, Random  Result Value Ref Range   Specimen Description URINE, RANDOM    Special Requests NONE    Culture (A)     MULTIPLE SPECIES PRESENT, SUGGEST RECOLLECTION NO GROUP B STREP (S.AGALACTIAE) ISOLATED Performed at [facility], [address], [place], [place]    Report Status 06/19/2022 FINAL   CBC  Result Value Ref Range   WBC 12.1 (H) 4.0 - 10.5 K/uL   RBC 2.89 (L) 3.87 - 5.11 MIL/uL   Hemoglobin 8.8 (L) 12.0 - 15.0 g/dL   HCT 06/21/2022 (L) 15.8 - 30.9 %   MCV 90.7 80.0  - 100.0 fL   MCH 30.4 26.0 - 34.0 pg   MCHC 33.6 30.0 - 36.0 g/dL   RDW 40.7 68.0 - 88.1 %   Platelets 349 150 - 400 K/uL   nRBC 0.0 0.0 - 0.2 %  Hemoglobin A1c  Result Value Ref Range   Hgb A1c MFr Bld 5.2 4.8 - 5.6 %   Mean Plasma Glucose 103 mg/dL  Glucose, capillary  Result Value Ref Range   Glucose-Capillary 76 70 - 99 mg/dL  Glucose, capillary  Result Value Ref Range   Glucose-Capillary 110 (H) 70 - 99 mg/dL   Comment 1 Notify RN   Glucose, capillary  Result Value Ref Range   Glucose-Capillary 151 (H) 70 - 99 mg/dL   Comment 1 Notify RN   CBC  Result Value Ref Range   WBC 12.1 (H) 4.0 - 10.5 K/uL   RBC 3.66 (L) 3.87 - 5.11 MIL/uL   Hemoglobin 10.9 (L) 12.0 - 15.0 g/dL   HCT 10.3 (L) 15.9 - 45.8 %   MCV 87.2 80.0 - 100.0 fL   MCH 29.8 26.0 - 34.0 pg   MCHC 34.2 30.0 - 36.0 g/dL   RDW 59.2 92.4 - 46.2 %   Platelets 306 150 - 400 K/uL   nRBC 0.0 0.0 - 0.2 %  Glucose, capillary  Result Value Ref Range   Glucose-Capillary 165 (H) 70 - 99 mg/dL  Comment 1 Notify RN   Glucose, capillary  Result Value Ref Range   Glucose-Capillary 104 (H) 70 - 99 mg/dL  Glucose, capillary  Result Value Ref Range   Glucose-Capillary 107 (H) 70 - 99 mg/dL   Comment 1 Notify RN   Glucose, capillary  Result Value Ref Range   Glucose-Capillary 108 (H) 70 - 99 mg/dL  Glucose, capillary  Result Value Ref Range   Glucose-Capillary 84 70 - 99 mg/dL  Glucose, capillary  Result Value Ref Range   Glucose-Capillary 124 (H) 70 - 99 mg/dL  Glucose, capillary  Result Value Ref Range   Glucose-Capillary 93 70 - 99 mg/dL  Glucose, capillary  Result Value Ref Range   Glucose-Capillary 122 (H) 70 - 99 mg/dL  Glucose, capillary  Result Value Ref Range   Glucose-Capillary 101 (H) 70 - 99 mg/dL   Comment 1 Notify RN   OB RESULTS CONSOLE RPR  Result Value Ref Range   RPR Nonreactive   OB RESULTS CONSOLE HIV antibody  Result Value Ref Range   HIV Non-reactive   OB RESULTS CONSOLE Rubella  Antibody  Result Value Ref Range   Rubella Nonimmune   OB RESULTS CONSOLE Hepatitis B surface antigen  Result Value Ref Range   Hepatitis B Surface Ag Negative   Rapid urine drug screen (hospital performed)  Result Value Ref Range   Opiates NONE DETECTED NONE DETECTED   Cocaine NONE DETECTED NONE DETECTED   Benzodiazepines NONE DETECTED NONE DETECTED   Amphetamines NONE DETECTED NONE DETECTED   Tetrahydrocannabinol NONE DETECTED NONE DETECTED   Barbiturates NONE DETECTED NONE DETECTED  CBC  Result Value Ref Range   WBC 11.8 (H) 4.0 - 10.5 K/uL   RBC 3.28 (L) 3.87 - 5.11 MIL/uL   Hemoglobin 10.0 (L) 12.0 - 15.0 g/dL   HCT 99.2 (L) 42.6 - 83.4 %   MCV 87.8 80.0 - 100.0 fL   MCH 30.5 26.0 - 34.0 pg   MCHC 34.7 30.0 - 36.0 g/dL   RDW 19.6 22.2 - 97.9 %   Platelets 275 150 - 400 K/uL   nRBC 0.0 0.0 - 0.2 %  Fibrinogen (coagulopathy lab panel)  Result Value Ref Range   Fibrinogen 433 210 - 475 mg/dL  Protime-INR (coagulopathy lab panel)  Result Value Ref Range   Prothrombin Time 13.5 11.4 - 15.2 seconds   INR 1.0 0.8 - 1.2  APTT (coagulopathy lab panel)  Result Value Ref Range   aPTT 22 (L) 24 - 36 seconds  Type and screen [facility]  Result Value Ref Range   ABO/RH(D) O NEG    Antibody Screen NEG    Sample Expiration      06/20/2022,2359 Performed at [facility], [address], [place], [place] [address]   Type and screen [facility]  Result Value Ref Range   ABO/RH(D) O NEG    Antibody Screen POS    Sample Expiration 06/22/2022,2359    Antibody Identification      PASSIVELY ACQUIRED ANTI-D Performed at [facility], [address] [place], [place] [address]   GC/Chlamydia probe amp ([place])not at [facility]  Result Value Ref Range   Neisseria Gonorrhea Negative    Chlamydia Negative    Comment Normal Reference Ranger Chlamydia - Negative    Comment      Normal Reference Range Neisseria Gonorrhea - Negative      A/P: Kristen Padilla is a 31 y.o.  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  Chronic partial abruption - FHT overall reassuring now, counseled primary RN to notify provider as soon as next fetal heart rate deceleration is noted.  - Abdominal pain from earlier this evening has resolved after IVF bolus and Tylenol -- suspect pain overnight was due to contractions which were not graphing on toco - Continue CEFM/Toco - S/p BMZ x 2 doses (12/18-12/19) - GBS negative - CBC stable and Coags overall unremarkable (12/20) - S/p MFM and NICU Consults - S/p Rhogam for Rh negative status - Patient was counseled regarding the possibility of need for cesarean delivery for fetal indications or if significant vaginal bleeding  Class A1DM - CBG fasting and 2h postprandial - SSI ordered  Recommendations per MFM (Dr. Darra Lis): - BMZ course has been started -- completed - Magnesium sulfate is only indicated if preterm birth is likely to occur or indicated at less than [redacted] weeks gestation - NICU consultation if not done -- completed - Continuous fetal monitoring with any ongoing bleeding. Once stable consider NST 2-3 times per day.  - Weekly ultrasound to assess the placenta for possible visualization of placental abruption - Continue to give Rhogam as needed with KB test to quantify the dose needed - CBC and coags should be performed at least 2-3 times per week to assess for coagulopathy and internal bleeding - Maintain IV in place for rapid infusion of crystalloid or blood products when needed - Monitor for signs of PPROM since placental abruption increases this risk - Serial growth ultrasound every 4 weeks - Due to gestational diabetes continue to check blood sugars fasting and postprandial - Vaginal progesterone can be discontinued since she is bleeding   - Delivery likely around 35-37 weeks but may be sooner if indicated - If she remains  clinically stable in 5-7 days we can reassess for possible discharge with close follow-up     Steva Ready, DO

## 2022-06-20 NOTE — Progress Notes (Signed)
Received call from primary RN regarding labs as documented below. Hemoglobin appears stable. Coagulopathy panel appears overall unremarkable. aPTT mildly decreased at 22 but will trend this value. Per primary RN, patient feeling much better after IVF bolus and the pain has spaced out. Suspect previous pain was due to mild uterine contractions. FHT very reassuring -- baseline 135bpm, moderate variability, 15x15 accels, no decels. No contractions on toco.      Latest Ref Rng & Units 06/19/2022   11:19 PM 06/18/2022    5:15 AM 06/17/2022    3:50 PM  CBC  WBC 4.0 - 10.5 K/uL 11.8  12.1  12.1   Hemoglobin 12.0 - 15.0 g/dL 29.5  18.8  8.8   Hematocrit 36.0 - 46.0 % 28.8  31.9  26.2   Platelets 150 - 400 K/uL 275  306  349        Component Ref Range & Units 1 d ago  Fibrinogen 210 - 475 mg/dL 416  Comment: (NOTE) Fibrinogen results may be underestimated in patients receiving thrombolytic therapy. Performed at [facility], [address], [place], [place]           Component Ref Range & Units 1 d ago (06/19/22) 2 yr ago (05/22/20) 2 yr ago (05/21/20)  Prothrombin Time 11.4 - 15.2 seconds 13.5 13.7 13.6  INR 0.8 - 1.2 1.0 1.1 CM 1.1 CM  Comment: (NOTE)     Component Ref Range & Units 1 d ago (06/19/22) 2 yr ago (05/22/20) 2 yr ago (05/21/20)  aPTT 24 - 36 seconds 22 Low  25 CM 26 CM   Steva Ready, DO

## 2022-06-21 LAB — GLUCOSE, CAPILLARY
Glucose-Capillary: 79 mg/dL (ref 70–99)
Glucose-Capillary: 117 mg/dL — ABNORMAL HIGH (ref 70–99)
Glucose-Capillary: 105 mg/dL — ABNORMAL HIGH (ref 70–99)
Glucose-Capillary: 90 mg/dL (ref 70–99)
Glucose-Capillary: 81 mg/dL (ref 70–99)

## 2022-06-21 NOTE — Progress Notes (Signed)
Subjective:    Pt denies having any bleeding x 24 hours. Reports active fetal movement and does not perceive any contractions. Pt requests a wheelchair ride each day, discussed criteria    Objective:    VS: BP (!) 102/57 (BP Location: Left Arm)   Pulse 77   Temp 97.8 F (36.6 C) (Oral)   Resp 19   Ht 5\' 2"  (1.575 m)   Wt 94 kg   LMP 11/13/2021 (Exact Date)   SpO2 99%   BMI 37.90 kg/m  FHR : baseline 130 / variability moderate / accelerations present / absent decelerations Toco: contractions none Membranes: intact     Latest Ref Rng & Units 06/19/2022   11:19 PM 06/18/2022    5:15 AM 06/17/2022    3:50 PM  CBC  WBC 4.0 - 10.5 K/uL 11.8  12.1  12.1   Hemoglobin 12.0 - 15.0 g/dL 06/19/2022  12.4  8.8   Hematocrit 36.0 - 46.0 % 28.8  31.9  26.2   Platelets 150 - 400 K/uL 275  306  349        Assessment/Plan:   31 y.o. 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  Pt may have daily wheelchair rides when in stable condition 3hr GTT on 06/22/22 NPO after midnight  There is 20-30 points difference in glucose values of Dexacom and POCT machine. Dexacom dc'd.     Chronic partial abruption - FHT overall reassuring now, counseled primary RN to notify provider as soon as next fetal heart rate deceleration is noted.  - Abdominal pain from earlier this evening has resolved after IVF bolus and Tylenol -- suspect pain overnight was due to contractions which were not graphing on toco - Continue CEFM/Toco - S/p BMZ x 2 doses (12/18-12/19) - GBS negative - CBC stable and Coags overall unremarkable (12/20) - S/p MFM and NICU Consults - S/p Rhogam for Rh negative status - Patient was counseled regarding the possibility of need for cesarean delivery for fetal indications or if significant vaginal bleeding   Class A1DM - CBG fasting and 2h postprandial - SSI ordered   Recommendations per MFM (Dr. 01-05-1968): - BMZ course has been started -- completed - Magnesium  sulfate is only indicated if preterm birth is likely to occur or indicated at less than [redacted] weeks gestation - NICU consultation if not done -- completed - Continuous fetal monitoring with any ongoing bleeding. Once stable consider NST 2-3 times per day.  - Weekly ultrasound to assess the placenta for possible visualization of placental abruption - Continue to give Rhogam as needed with KB test to quantify the dose needed - CBC and coags should be performed at least 2-3 times per week to assess for coagulopathy and internal bleeding - Maintain IV in place for rapid infusion of crystalloid or blood products when needed - Monitor for signs of PPROM since placental abruption increases this risk - Serial growth ultrasound every 4 weeks - Due to gestational diabetes continue to check blood sugars fasting and postprandial - Vaginal progesterone can be discontinued since she is bleeding   - Delivery likely around 35-37 weeks but may be sooner if indicated - If she remains clinically stable in 5-7 days we can reassess for possible discharge with close follow-up   Kristen Padilla, CNM 06/21/2022 7:16 PM

## 2022-06-22 LAB — GLUCOSE, FASTING GESTATIONAL: Glucose Tolerance, Fasting: 76 mg/dL

## 2022-06-22 LAB — GLUCOSE, CAPILLARY
Glucose-Capillary: 80 mg/dL (ref 70–99)
Glucose-Capillary: 108 mg/dL — ABNORMAL HIGH (ref 70–99)
Glucose-Capillary: 97 mg/dL (ref 70–99)

## 2022-06-22 LAB — GLUCOSE, 1 HOUR GESTATIONAL: Glucose Tolerance, 1 hour: 191 mg/dL

## 2022-06-22 NOTE — Progress Notes (Signed)
Patient tolerated 100gm Glucola po at 0608.

## 2022-06-22 NOTE — Progress Notes (Signed)
Subjective:    Doing well, but expresses her desire to be discharged. Reviewed MFM plan of care and discharge criteria. Abnormal 1 hr GTT this pregnancy, but pt has been refusing insulin "because it makes me feel bad".  Plan was developed to D/C DME device D/T inaccurate readings and perform 3 hr GTT this AM. Plan of care was changed D/T miscommunication between providers. Fasting and 1 hr CBG was resulted and then test was stopped. Discussed plan with Dr. Marinell Blight. And Dr. Richardson Dopp. Pt agrees to repeat 3 hr GTT tomorrow morning bc she desperately wants to d/c finger sticks and insulin.   Objective:    VS: BP 114/73   Pulse (!) 101   Temp 98.2 F (36.8 C) (Oral)   Resp 18   Ht 5\' 2"  (1.575 m)   Wt 94 kg   LMP 11/13/2021 (Exact Date)   SpO2 100%   BMI 37.90 kg/m  NST : baseline 130 / variability moderate continuous surveillance is interrupted and further assessment needed  Toco: does not perceive contractions Membranes: intact Vaginal bleeding: none in past 24 hours    Assessment/Plan:   31 y.o. 26 [redacted]w[redacted]d  Admitted for vaginal bleeding due to chronic partial abruption.      Chronic partial abruption - NST TIDo- S/p BMZ x 2 doses (12/18-12/19) - GBS negative - CBC stable and Coags overall unremarkable (12/20) - S/p MFM and NICU Consults - S/p Rhogam for Rh negative status - Patient was counseled regarding the possibility of need for cesarean delivery for fetal indications or if significant vaginal bleeding   Class A1DM -Hold for finger sticks and SSI for 06/22/22 -3hr GTT 06/23/22 AM (BMZ 12/18 and 12/19) - NPO after midnight   Recommendations per MFM (Dr. 1/20): - BMZ course has been started -- completed - Magnesium sulfate is only indicated if preterm birth is likely to occur or indicated at less than [redacted] weeks gestation - NICU consultation if not done -- completed - Continuous fetal monitoring with any ongoing bleeding. Once stable consider NST 2-3 times per day.   - Weekly ultrasound to assess the placenta for possible visualization of placental abruption - Continue to give Rhogam as needed with KB test to quantify the dose needed - CBC and coags should be performed at least 2-3 times per week to assess for coagulopathy and internal bleeding - Maintain IV in place for rapid infusion of crystalloid or blood products when needed - Monitor for signs of PPROM since placental abruption increases this risk - Serial growth ultrasound every 4 weeks - Due to gestational diabetes continue to check blood sugars fasting and postprandial - Vaginal progesterone can be discontinued since she is bleeding   - Delivery likely around 35-37 weeks but may be sooner if indicated - If she remains clinically stable in 5-7 days we can reassess for possible discharge with close follow-up   Plan dicussed with Dr. Darra Lis. And Dr. Marinell Blight, DNP, CNM 06/22/2022 9:14 AM

## 2022-06-22 NOTE — Progress Notes (Signed)
Patient ID: Kristen Padilla Kristen Padilla, female   DOB: [birth year], 31 y.o.   MRN: [id number]  Subjective: Pt denies vaginal bleeding. Last bleed was 06/20/2022. +FM no lof no vaginal bleeding. Pt voiced concern regarding requiring insulin and symptoms of hypoglycemia which is why she wants to have 3 hour GTT to determine if she truly has gestational diabetes. This plan was developed with the Midwife Rhea Pink.  Vitals:   06/21/22 1918 06/21/22 2206 06/22/22 0832 06/22/22 1527  BP: 103/63 103/65 114/73 110/72  Pulse: 83 79 (!) 101 89  Resp: 18 18 18 18   Temp: 97.9 F (36.6 C) 97.7 F (36.5 C) 98.2 F (36.8 C) 98.1 F (36.7 C)  TempSrc: Oral Oral Oral   SpO2: 99% 100% 100% 100%  Weight:      Height:        General: Alert and oriented NAD.  Abdomen gravid nontender  Resp: no distress  Extremities no evidence of dvt  FHR reactive NST this AM   Results for orders placed or performed during the hospital encounter of 06/17/22 (from the past 24 hour(s))  Glucose, capillary     Status: None   Collection Time: 06/21/22  7:43 PM  Result Value Ref Range   Glucose-Capillary 90 70 - 99 mg/dL  Glucose, capillary     Status: None   Collection Time: 06/21/22 10:04 PM  Result Value Ref Range   Glucose-Capillary 81 70 - 99 mg/dL  Glucose, fasting gestational     Status: None   Collection Time: 06/22/22  6:03 AM  Result Value Ref Range   Glucose Tolerance, Fasting 76 mg/dL  Glucose, 1 hour gestational     Status: None   Collection Time: 06/22/22  7:10 AM  Result Value Ref Range   Glucose Tolerance, 1 hour 191 mg/dL  Glucose, capillary     Status: None   Collection Time: 06/22/22 10:35 AM  Result Value Ref Range   Glucose-Capillary 80 70 - 99 mg/dL  Glucose, capillary     Status: Abnormal   Collection Time: 06/22/22  2:47 PM  Result Value Ref Range   Glucose-Capillary 108 (H) 70 - 99 mg/dL    Assessment/Plan:    31 y.o. 06/24/22 [redacted]w[redacted]d   vaginal bleeding due to chronic partial  abruption. Currently stable.    Pt may have daily wheelchair rides when in stable condition 3hr GTT on 06/23/22 NPO after midnight  There is 20-30 points difference in glucose values of Dexacom and POCT machine. Dexacom dc'd.     - NST q shift  - S/p BMZ x 2 doses (12/18-12/19) - GBS negative - CBC stable and Coags overall unremarkable (12/20) - S/p MFM and NICU Consults - S/p Rhogam for Rh negative status - Patient was counseled regarding the possibility of need for cesarean delivery for fetal indications or if significant vaginal bleeding   Class A1DM questionable.  Plan 3 hour GTT 06/23/2022    Recommendations per MFM (Dr. 06/25/2022): - BMZ course has been started -- completed - Magnesium sulfate is only indicated if preterm birth is likely to occur or indicated at less than [redacted] weeks gestation - NICU consultation if not done -- completed - Continuous fetal monitoring with any ongoing bleeding. Once stable consider NST 2-3 times per day.  - Weekly ultrasound to assess the placenta for possible visualization of placental abruption - Continue to give Rhogam as needed with KB test to quantify the dose needed - CBC and coags should be performed at  least 2-3 times per week to assess for coagulopathy and internal bleeding - Maintain IV in place for rapid infusion of crystalloid or blood products when needed - Monitor for signs of PPROM since placental abruption increases this risk - Serial growth ultrasound every 4 weeks - Due to gestational diabetes continue to check blood sugars fasting and postprandial - Vaginal progesterone can be discontinued since she is bleeding   - Delivery likely around 35-37 weeks but may be sooner if indicated - If she remains clinically stable in 5-7 days we can reassess for possible discharge with close follow-up

## 2022-06-23 LAB — TYPE AND SCREEN
ABO/RH(D): O NEG
Antibody Screen: POSITIVE

## 2022-06-23 LAB — CBC
HCT: 32.5 % — ABNORMAL LOW (ref 36.0–46.0)
Hemoglobin: 11.2 g/dL — ABNORMAL LOW (ref 12.0–15.0)
MCH: 30.2 pg (ref 26.0–34.0)
MCHC: 34.5 g/dL (ref 30.0–36.0)
MCV: 87.6 fL (ref 80.0–100.0)
Platelets: 325 10*3/uL (ref 150–400)
RBC: 3.71 MIL/uL — ABNORMAL LOW (ref 3.87–5.11)
RDW: 11.6 % (ref 11.5–15.5)
WBC: 13 10*3/uL — ABNORMAL HIGH (ref 4.0–10.5)
nRBC: 0 % (ref 0.0–0.2)

## 2022-06-23 LAB — GLUCOSE, FASTING GESTATIONAL: Glucose Tolerance, Fasting: 79 mg/dL

## 2022-06-23 LAB — GLUCOSE, CAPILLARY: Glucose-Capillary: 74 mg/dL (ref 70–99)

## 2022-06-23 MED ORDER — ONDANSETRON HCL 4 MG/2ML IJ SOLN
4.0000 mg | Freq: Four times a day (QID) | INTRAMUSCULAR | Status: DC | PRN
  Administered 2022-06-23: 4 mg via INTRAVENOUS
  Filled 2022-06-23: qty 2

## 2022-06-23 NOTE — Discharge Summary (Signed)
Obstetric Discharge Summary Reason for Admission:  vaginal bleeding Prenatal Procedures: ultrasound  Hemoglobin  Date Value Ref Range Status  06/23/2022 11.2 (L) 12.0 - 15.0 g/dL Final   HCT  Date Value Ref Range Status  06/23/2022 32.5 (L) 36.0 - 46.0 % Final    Physical Exam:  General: Alert and oriented NAD.  Abdomen gravid nontender  Resp: no distress  Extremities no evidence of dvt  FHR reactive NST this AM   Discharge Diagnoses: Vaginal bleeding in 3rd trimester, Suspected chronic abruption, GDMA1  Discharge Information: Date: 06/23/2022 Activity: pelvic rest Diet: routine and gestational diabetic Medications: None Condition: stable Instructions: refer to practice specific booklet Discharge to: home  Follow-up Information     [facility]. Call on 06/25/2022.   Specialty: Obstetrics and Gynecology Why: Call on 06/25/2022 to schedule appointment and [place] this week. If [facility] unable to schedule [place], please call to schedule with MFM this week. Patient to have weekly [place] to asses placenta and growth [place] every 4 weeks. Contact information: [address] [place] [address] [phone]        Noralee Space, MD. Schedule an appointment as soon as possible for a visit in 1 week(s).   Specialty: Obstetrics and Gynecology Contact information: [address] [place] [address] [phone]                Recommendations per MFM (Dr. Darra Lis): - BMZ course has been started -- completed - Magnesium sulfate is only indicated if preterm birth is likely to occur or indicated at less than [redacted] weeks gestation - NICU consultation if not done -- completed - Continuous fetal monitoring with any ongoing bleeding. Once stable consider NST 2-3 times per day.  - Weekly ultrasound to assess the placenta for possible visualization of placental abruption - Continue to give Rhogam as needed with KB test to  quantify the dose needed - CBC and coags should be performed at least 2-3 times per week to assess for coagulopathy and internal bleeding - Maintain IV in place for rapid infusion of crystalloid or blood products when needed - Monitor for signs of PPROM since placental abruption increases this risk - Serial growth ultrasound every 4 weeks - Due to gestational diabetes continue to check blood sugars fasting and postprandial - Vaginal progesterone can be discontinued since she is bleeding   - Delivery likely around 35-37 weeks but may be sooner if indicated - If she remains clinically stable in 5-7 days we can reassess for possible discharge with close follow-up   As patient has had no further bleeding after 3 days, stable for discharge to home. Bleeding and pain precautions discussed. Patient informed to return to [facility] if she again experiences vaginal bleeding or contractions for evaluation. Patient to call on 06/25/2022 to schedule a f/u appointment w/ limited US to assess placenta at [facility]. If unable to schedule [place] with [facility] will schedule with MFM. Discussed with MFM attending Dr. Judeth Cornfield. Continue to check fasting and 2hr PP CBG     Montrae Braithwaite D Hildegarde Dunaway 06/23/2022, 1:43 PM

## 2022-06-23 NOTE — Progress Notes (Signed)
Patient ID: Kristen Padilla, female   DOB: 12-11-1990, 31 y.o.   MRN: [id number]  Subjective: Patient is doing well and denies contraction, vaginal bleeding and reports good fetal movement. Patient unable to tolerate Glucola this morning for 3hr GTT. Patient request 3hr GTT as she was concerned if she truly carried diagnoses of gestational diabetes and was having symptoms of hypoglycemia with insulin administration while admitted. Patient was not requiring insulin at home and controlled GDM with diet. Ask if patient still desires 3hr GTT and she states she does not, she will continue checking her fasting and 2hr PP CBGs and report at office visits.   Vitals:   06/22/22 1527 06/22/22 1950 06/22/22 2223 06/23/22 0936  BP: 110/72 106/69 109/65 (!) 103/58  Pulse: 89 94 83 84  Resp: 18 18 17 16   Temp: 98.1 F (36.7 C) 98.1 F (36.7 C) 97.9 F (36.6 C) 98.1 F (36.7 C)  TempSrc:  Oral Oral Oral  SpO2: 100% 99% 99% 98%  Weight:      Height:        General: Alert and oriented NAD.  Abdomen gravid nontender  Resp: no distress  Extremities no evidence of dvt  FHR reactive NST this AM   Results for orders placed or performed during the hospital encounter of 06/17/22 (from the past 24 hour(s))  Glucose, capillary     Status: Abnormal   Collection Time: 06/22/22  2:47 PM  Result Value Ref Range   Glucose-Capillary 108 (H) 70 - 99 mg/dL  Glucose, capillary     Status: None   Collection Time: 06/22/22  8:07 PM  Result Value Ref Range   Glucose-Capillary 97 70 - 99 mg/dL  Type and screen  [facility]     Status: None   Collection Time: 06/23/22 12:21 AM  Result Value Ref Range   ABO/RH(D) O NEG    Antibody Screen POS    Sample Expiration 06/26/2022,2359    Antibody Identification      PASSIVELY ACQUIRED ANTI-D Performed at [facility], [address] [place], [place]   CBC     Status: Abnormal   Collection Time: 06/23/22 12:23 AM  Result  Value Ref Range   WBC 13.0 (H) 4.0 - 10.5 K/uL   RBC 3.71 (L) 3.87 - 5.11 MIL/uL   Hemoglobin 11.2 (L) 12.0 - 15.0 g/dL   HCT 06/25/22 (L) 82.8 - 00.3 %   MCV 87.6 80.0 - 100.0 fL   MCH 30.2 26.0 - 34.0 pg   MCHC 34.5 30.0 - 36.0 g/dL   RDW 49.1 79.1 - 50.5 %   Platelets 325 150 - 400 K/uL   nRBC 0.0 0.0 - 0.2 %  Glucose, fasting gestational     Status: None   Collection Time: 06/23/22  5:45 AM  Result Value Ref Range   Glucose Tolerance, Fasting 79 mg/dL  Glucose, capillary     Status: None   Collection Time: 06/23/22  9:46 AM  Result Value Ref Range   Glucose-Capillary 74 70 - 99 mg/dL    Assessment/Plan:    31 y.o. 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 Currently stable.    Pt may have daily wheelchair rides when in stable condition Declined 3hr GTT this morning, will continue to check fasting and 2hr PP CBG   - NST q shift  - S/p BMZ x 2 doses (12/18-12/19) - GBS negative - CBC stable and Coags overall  unremarkable (12/20) - S/p MFM and NICU Consults - S/p Rhogam for Rh negative status - Patient was counseled regarding the possibility of need for cesarean delivery for fetal indications or if significant vaginal bleeding    Recommendations per MFM (Dr. Darra Lis): - BMZ course has been started -- completed - Magnesium sulfate is only indicated if preterm birth is likely to occur or indicated at less than [redacted] weeks gestation - NICU consultation if not done -- completed - Continuous fetal monitoring with any ongoing bleeding. Once stable consider NST 2-3 times per day.  - Weekly ultrasound to assess the placenta for possible visualization of placental abruption - Continue to give Rhogam as needed with KB test to quantify the dose needed - CBC and coags should be performed at least 2-3 times per week to assess for coagulopathy and internal bleeding - Maintain IV in place for rapid infusion of crystalloid or blood products when needed - Monitor for  signs of PPROM since placental abruption increases this risk - Serial growth ultrasound every 4 weeks - Due to gestational diabetes continue to check blood sugars fasting and postprandial - Vaginal progesterone can be discontinued since she is bleeding   - Delivery likely around 35-37 weeks but may be sooner if indicated - If she remains clinically stable in 5-7 days we can reassess for possible discharge with close follow-up   As patient has had no further bleeding after 3 days, stable for discharge to home. Bleeding and pain precautions discussed. Patient informed to return to MAU if she again experiences vaginal bleeding or contractions for evaluation. Patient to call on 06/25/2022 to schedule a f/u appointment w/ limited US to assess placenta at [facility]. If unable to schedule [place] with [facility] will schedule with MFM. Discussed with MFM attending Dr. Judeth Cornfield.  Dr. Reesa Chew

## 2022-06-27 ENCOUNTER — Other Ambulatory Visit: Payer: Self-pay

## 2022-06-27 ENCOUNTER — Encounter (HOSPITAL_COMMUNITY): Payer: Self-pay | Admitting: Obstetrics & Gynecology

## 2022-06-27 ENCOUNTER — Inpatient Hospital Stay (HOSPITAL_COMMUNITY)
Admission: AD | Admit: 2022-06-27 | Discharge: 2022-07-01 | DRG: 788 | Disposition: A | Discharge status: Home / Self Care | Payer: Medicaid Other | Attending: Obstetrics & Gynecology | Admitting: Obstetrics & Gynecology

## 2022-06-27 DIAGNOSIS — O4693 Antepartum hemorrhage, unspecified, third trimester: Secondary | ICD-10-CM | POA: Diagnosis present

## 2022-06-27 DIAGNOSIS — O26899 Other specified pregnancy related conditions, unspecified trimester: Principal | ICD-10-CM

## 2022-06-27 DIAGNOSIS — O9902 Anemia complicating childbirth: Secondary | ICD-10-CM | POA: Diagnosis present

## 2022-06-27 DIAGNOSIS — O26893 Other specified pregnancy related conditions, third trimester: Secondary | ICD-10-CM | POA: Diagnosis present

## 2022-06-27 DIAGNOSIS — O99214 Obesity complicating childbirth: Secondary | ICD-10-CM | POA: Diagnosis present

## 2022-06-27 DIAGNOSIS — O24420 Gestational diabetes mellitus in childbirth, diet controlled: Secondary | ICD-10-CM | POA: Diagnosis present

## 2022-06-27 DIAGNOSIS — Z23 Encounter for immunization: Secondary | ICD-10-CM | POA: Diagnosis not present

## 2022-06-27 DIAGNOSIS — E669 Obesity, unspecified: Secondary | ICD-10-CM | POA: Diagnosis not present

## 2022-06-27 DIAGNOSIS — O328XX0 Maternal care for other malpresentation of fetus, not applicable or unspecified: Secondary | ICD-10-CM | POA: Diagnosis present

## 2022-06-27 DIAGNOSIS — O4593 Premature separation of placenta, unspecified, third trimester: Secondary | ICD-10-CM | POA: Diagnosis not present

## 2022-06-27 DIAGNOSIS — O321XX0 Maternal care for breech presentation, not applicable or unspecified: Secondary | ICD-10-CM | POA: Diagnosis not present

## 2022-06-27 DIAGNOSIS — D509 Iron deficiency anemia, unspecified: Secondary | ICD-10-CM | POA: Diagnosis present

## 2022-06-27 DIAGNOSIS — O99843 Bariatric surgery status complicating pregnancy, third trimester: Secondary | ICD-10-CM | POA: Diagnosis not present

## 2022-06-27 DIAGNOSIS — Z6791 Unspecified blood type, Rh negative: Secondary | ICD-10-CM

## 2022-06-27 DIAGNOSIS — Z3A31 31 weeks gestation of pregnancy: Secondary | ICD-10-CM

## 2022-06-27 DIAGNOSIS — O99213 Obesity complicating pregnancy, third trimester: Secondary | ICD-10-CM | POA: Diagnosis not present

## 2022-06-27 HISTORY — DX: Antepartum hemorrhage, unspecified, third trimester: O46.93

## 2022-06-27 LAB — TYPE AND SCREEN
ABO/RH(D): O NEG
Antibody Screen: POSITIVE

## 2022-06-27 MED ORDER — ACETAMINOPHEN 325 MG PO TABS
650.0000 mg | ORAL_TABLET | ORAL | Status: DC | PRN
  Administered 2022-06-28: 650 mg via ORAL
  Filled 2022-06-27: qty 2

## 2022-06-27 MED ORDER — PRENATAL MULTIVITAMIN CH
1.0000 | ORAL_TABLET | Freq: Every day | ORAL | Status: DC
  Filled 2022-06-27: qty 1

## 2022-06-27 MED ORDER — LACTATED RINGERS IV SOLN: 125.0000 mL/h | INTRAVENOUS | Status: AC

## 2022-06-27 MED ORDER — CALCIUM CARBONATE ANTACID 500 MG PO CHEW: 2.0000 | CHEWABLE_TABLET | ORAL | Status: DC | PRN

## 2022-06-27 MED ORDER — DOCUSATE SODIUM 100 MG PO CAPS
100.0000 mg | ORAL_CAPSULE | Freq: Every day | ORAL | Status: DC
  Administered 2022-06-28 – 2022-07-01 (×3): 100 mg via ORAL
  Filled 2022-06-27 (×4): qty 1

## 2022-06-27 NOTE — MAU Provider Note (Signed)
History     CSN: [id number]  Arrival date and time: 06/27/22 1912   Event Date/Time   First Provider Initiated Contact with Patient 06/27/22 2002      Chief Complaint  Patient presents with   Vaginal Bleeding   Abdominal Pain   HPI  Kristen Padilla [name] is a 31 y.o. [id number] at [redacted]w[redacted]d who presents for evaluation of vaginal bleeding. Patient reports at 1830 she started seeing bright red blood when she wiped. She reports it was spotting at first but has gradually gotten heavier. She also reports lower abdominal cramping that started when the bleeding started. Patient rates the pain as a 5/10 and has not tried anything for the pain.  She denies any discharge and leaking of fluid. Denies any constipation, diarrhea or any urinary complaints. Reports normal fetal movement.  This is her 4th presentation for vaginal bleeding in December. She was admitted on 12/18 to 12/24 for suspected abruption. She reports she has not had any bleeding in 4 days and had a normal ultrasound on 12/26 in the office. She denies any intercourse or exams before today.   OB History     Gravida  7   Para  1   Term  1   Preterm      AB  5   Living  1      SAB  5   IAB      Ectopic      Multiple      Live Births  1           Past Medical History:  Diagnosis Date   Depression    Miscarriage    Morbid obesity (HCC)    UTI (urinary tract infection)     Past Surgical History:  Procedure Laterality Date   HERNIA REPAIR     LAPAROSCOPIC GASTRIC SLEEVE RESECTION     Repair of displaced fracture of the left radius  03/2020    Family History  Problem Relation Age of Onset   Diabetes Mother    Hypertension Mother    Depression Mother    Hypertension Father     Social History   Tobacco Use   Smoking status: Never   Smokeless tobacco: Never  Vaping Use   Vaping Use: Never used  Substance Use Topics   Alcohol use: No   Drug use: No    Allergies:  Allergies   Allergen Reactions   Progesterone Swelling    Vaginal swelling when taken vaginally   Aripiprazole Rash   Sulfa Antibiotics Rash    Medications Prior to Admission  Medication Sig Dispense Refill Last Dose   Prenatal 28-0.8 MG TABS Take 1 tablet by mouth daily. (Patient not taking: Reported on 06/20/2022) 30 tablet 12     Review of Systems  Constitutional: Negative.  Negative for fatigue and fever.  HENT: Negative.    Respiratory: Negative.  Negative for shortness of breath.   Cardiovascular: Negative.  Negative for chest pain.  Gastrointestinal:  Positive for abdominal pain. Negative for constipation, diarrhea, nausea and vomiting.  Genitourinary:  Positive for vaginal bleeding. Negative for dysuria.  Neurological: Negative.  Negative for dizziness and headaches.   Physical Exam   Blood pressure 111/65, pulse 83, temperature 98.3 F (36.8 C), resp. rate 17, height 5\' 2"  (1.575 m), weight 94.8 kg, last menstrual period 11/13/2021, SpO2 99 %.  Patient Vitals for the past 24 hrs:  BP Temp Pulse Resp SpO2 Height Weight  06/27/22 1938 111/65 -- -- -- -- -- --  06/27/22 1937 -- 98.3 F (36.8 C) 83 17 99 % 5\' 2"  (1.575 m) 94.8 kg    Physical Exam Vitals and nursing note reviewed.  Constitutional:      General: She is not in acute distress.    Appearance: She is well-developed.  HENT:     Head: Normocephalic.  Eyes:     Pupils: Pupils are equal, round, and reactive to light.  Cardiovascular:     Rate and Rhythm: Normal rate and regular rhythm.     Heart sounds: Normal heart sounds.  Pulmonary:     Effort: Pulmonary effort is normal. No respiratory distress.     Breath sounds: Normal breath sounds.  Abdominal:     General: Bowel sounds are normal. There is no distension.     Palpations: Abdomen is soft.     Tenderness: There is no abdominal tenderness.  Genitourinary:    Vagina: Bleeding present.  Skin:    General: Skin is warm and dry.  Neurological:     Mental  Status: She is alert and oriented to person, place, and time.  Psychiatric:        Mood and Affect: Mood normal.        Behavior: Behavior normal.        Thought Content: Thought content normal.        Judgment: Judgment normal.     Fetal Tracing:  Baseline: 120 Variability: moderate Accels: 15x15 Decels: none  Toco: none  MAU Course  Procedures   MDM Prenatal records from community office reviewed. Pregnancy complicated by suspected abruption  Labs ordered and reviewed.   SSE: approximately 87ml of bright red blood in vault and speculum, bright red blood on vaginal walls and cervix. No active bleeding noted from cervical os and cervix appears long and closed. Digital exam deferred at this time.   NST reactive  CNM consulted with Dr. 43m regarding presentation and results- MD recommends admission for observation of bleeding and consult with MFM  Assessment and Plan   1. Rh negative, antepartum   2. Vaginal bleeding in pregnancy, third trimester   3. [redacted] weeks gestation of pregnancy    -Admit for observation -Care turned over to MD  Sallye Ober, CNM 06/27/2022, 8:03 PM

## 2022-06-27 NOTE — H&P (Addendum)
Kristen Padilla is a 30 y.o. female presenting for vaginal bleeding that spontaneously began earlier this evening while eating. Bleeding is described as spotting that became heavier.  She did not require any peri pads usage for the bleeding.  An evaluation in MAU showed small bright red vaginal bleeding coming from the cervix, less then 50cc and cervix to be visually closed.  She was admitted for further monitoring and management.  Patient is a [id number] at 31 weeks 1 day EGA, EDC by first trimester U/S, EDC is 08/28/22 with a known history of presumed placenta abruption.  This is fourth vaginal bleeding episode for patient after 20 weeks of pregnancy, first bleed was at 21 weeks, then 27 weeks, then 29 weeks.  She was recently admitted in hospital at 29 weeks, for 1 week for vaginal bleeding.  She remained stable without further bleeding after discharge on 06/23/22 until current bleeding.   Prenatal course significant for: Gestational DMA1. Short cervix at 21 weeks and s/p normalization of cervical length and s/p vaginal progesterone use. Rh negative and is s/p Rhogam at [redacted] weeks EGA, on 04/09/22 and most recently on 06/17/22 at [redacted] weeks EGA. Betamethasone injection on 06/17/22 and 06/18/22.   OB History     Gravida  7   Para  1   Term  1   Preterm      AB  5   Living  1      SAB  5   IAB      Ectopic      Multiple      Live Births  1          Past Medical History:  Diagnosis Date   Depression    Miscarriage    Morbid obesity ([place])    UTI (urinary tract infection)    Past Surgical History:  Procedure Laterality Date   HERNIA REPAIR     LAPAROSCOPIC GASTRIC SLEEVE RESECTION     Repair of displaced fracture of the left radius  03/2020   Family History: family history includes Depression in her mother; Diabetes in her mother; Hypertension in her father and mother. Social History:  reports that she has never smoked. She has never used smokeless tobacco. She  reports that she does not drink alcohol and does not use drugs.     Maternal Diabetes: Yes:  Diabetes Type:  Diet controlled Genetic Screening: Normal Maternal Ultrasounds/Referrals: Normal Fetal Ultrasounds or other Referrals:  None Maternal Substance Abuse:  No Significant Maternal Medications:  None Significant Maternal Lab Results:  Group B Strep negative Number of Prenatal Visits:greater than 3 verified prenatal visits Other Comments:  None  Review of Systems History Constitutional: Denies fevers/chills Cardiovascular: Denies chest pain or palpitations Pulmonary: Denies coughing or wheezing Gastrointestinal: Denies nausea, vomiting or diarrhea Genitourinary: Denies pelvic pain.  With unusual vaginal bleeding.  Denies unusual vaginal discharge, dysuria, urgency or frequency.  Musculoskeletal: Denies muscle or joint aches and pain.  Neurology: Denies abnormal sensations such as tingling or numbness.    Exam Physical Exam  Blood pressure 110/70, pulse 76, temperature 97.8 F (36.6 C), temperature source Oral, resp. rate 16, height 5\' 2"  (1.575 m), weight 94.8 kg, last menstrual period 11/13/2021, SpO2 99 %. Constitutional: She is oriented to person, place, and time. She appears well-developed and well-nourished.  HENT:  Head: Normocephalic and atraumatic.  Neck: Normal range of motion.  Cardiovascular: Normal rate, regular rhythm and normal heart sounds.   Respiratory: Effort normal and  breath sounds normal.  GI: Soft. Bowel sounds are normal.  Neurological: She is alert and oriented to person, place, and time.  Skin: Skin is warm and dry.  Psychiatric: She has a normal mood and affect. Her behavior is normal.   06/27/22: NST: 150 BL, moderate variability, reactive. TOCO: No contractions.  CVX: deferred.   Current Facility-Administered Medications:    acetaminophen (TYLENOL) tablet 650 mg, 650 mg, Oral, Q4H PRN, Len Blalock M, CNM   calcium carbonate (TUMS - dosed in  mg elemental calcium) chewable tablet 400 mg of elemental calcium, 2 tablet, Oral, Q4H PRN, Len Blalock M, CNM   [START ON 06/28/2022] docusate sodium (COLACE) capsule 100 mg, 100 mg, Oral, Daily, Len Blalock M, [place]   [START ON 06/28/2022] lactated ringers infusion, 125 mL/hr, Intravenous, On Call to OR, Wende Mott, CNM   [START ON 06/28/2022] prenatal multivitamin tablet 1 tablet, 1 tablet, Oral, Q1200, Wende Mott, CNM   Current Outpatient Medications  Medication Instructions   Prenatal 28-0.8 MG TABS 1 tablet, Oral, Daily    Allergies  Allergen Reactions   Progesterone Swelling    Vaginal swelling when taken vaginally   Aripiprazole Rash   Sulfa Antibiotics Rash     Prenatal labs: ABO, Rh: --/--/O NEG (12/28 2100) Antibody: POS (12/28 2100) Rubella: Nonimmune (07/28 0000) RPR: Nonreactive (07/28 0000)  HBsAg: Negative (07/28 0000)  HIV: Non-reactive (07/28 0000)  GBS: NEGATIVE/-- (12/19 1031)   Narrative & Impression  ----------------------------------------------------------------------  OBSTETRICS REPORT                        (Signed Final 06/13/2022 01:45 pm) ---------------------------------------------------------------------- Patient Info  ID #:       [id number]                          D.O.B.:  10/18/1990 (31 yrs)  Name:       Kristen Padilla               Visit Date: 06/13/2022 12:34 pm              LEOS-TORRES ---------------------------------------------------------------------- Performed By  Attending:        Sander Nephew      Secondary Phy.:    [facility] MAU/Triage                    MD  Performed By:     Nathen May       [facility]                                                              [facility]  Referred By:      Darlina Rumpf ---------------------------------------------------------------------- Orders  #  Description                           Code        Ordered By  1  [place] MFM OB FOLLOW UP                   [id number]    YU FANG ----------------------------------------------------------------------  #  Order #                     Accession #                Episode #  1  [id number]                   [id number]                 [id number] ---------------------------------------------------------------------- Indications  Obesity complicating pregnancy, third           O99.213  trimester (pre-G BMI 35)  Pregnancy complicated by previous gastric       O99.843  bypass, antepartum, third trimester  Poor obstetrical history (LGA- shoulder         O09.299  dystocia)  [redacted] weeks gestation of pregnancy                 Z3A.29  LR NIPS/ AFP Neg ---------------------------------------------------------------------- Vital Signs  BP:          123/69 ---------------------------------------------------------------------- Fetal Evaluation  Num Of Fetuses:          1  Fetal Heart Rate(bpm):   154  Cardiac Activity:        Observed  Presentation:            Cephalic  Placenta:                Anterior  P. Cord Insertion:       Previously Visualized  Amniotic Fluid  AFI FV:      Within normal limits  AFI Sum(cm)     %Tile       Largest Pocket(cm)  16.2            59          5.9  RUQ(cm)       RLQ(cm)       LUQ(cm)        LLQ(cm)  5.9           3.2           3.3            3.8 ---------------------------------------------------------------------- Biometry  BPD:      73.8  mm     G. Age:  29w 4d         53  %    CI:        75.12   %    70 - 86                                                          FL/HC:  20.2  %    19.6 - 20.8  HC:      270.1  mm     G. Age:  29w 3d         25  %    HC/AC:       1.03       0.99 - 1.21  AC:      261.1  mm     G. Age:  30w 2d         76  %    FL/BPD:      74.0  %    71 - 87  FL:        54.6  mm     G. Age:  28w 6d         27  %    FL/AC:       20.9  %    20 - 24  CER:        35  mm     G. Age:  29w 3d         60  %  LV:        2.4  mm  Est. FW:    1435   gm     3 lb 3 oz     57  % ---------------------------------------------------------------------- Gestational Age  LMP:           30w 2d        Date:  11/13/21                  EDD:   08/20/22  U/S Today:     29w 4d                                        EDD:   08/25/22  Best:          29w 1d     Det. By:  Marcella Dubs         EDD:   08/28/22                                      (01/08/22) ---------------------------------------------------------------------- Anatomy  Cranium:               Appears normal         LVOT:                   Previously seen  Cavum:                 Appears normal         Aortic Arch:            Previously seen  Ventricles:            Appears normal         Ductal Arch:            Not well visualized  Choroid Plexus:        Previously seen        Diaphragm:              Appears normal  Cerebellum:            Appears normal         Stomach:  Appears normal, left                                                                        sided  Posterior Fossa:       Appears normal         Abdomen:                Appears normal  Nuchal Fold:           Not applicable ([id number]    Abdominal Wall:         Previously seen                         wks GA)  Face:                  Orbits and profile     Cord Vessels:           Previously seen                         previously seen  Lips:                  Previously seen        Bladder:                Appears normal  Palate:                Not well visualized    Spine:                  Previously seen  Thoracic:              Previously seen        Upper Extremities:      Previously seen  Heart:                 Previously seen        Lower Extremities:      Previously seen  RVOT:                  Previously seen  Other:  Female gender previously  seen. Hands and heels/feet previously seen.          Technically difficult due to fetal position. ---------------------------------------------------------------------- Impression  Follow up growth due to complete fetal anatomy and mild  renal pyelectasis.  Normal interval growth with measurements consistent with  dates  Good fetal movement and amniotic fluid volume  The left renal pelvis measures 7.7 mm. There is no evidence  of caliectasis.  I discussed today's findings with Ms. Mickeal Skinner. Follow up  growth in 4 weeks to reevaluate the fetal pelvis.  All questions answered. ---------------------------------------------------------------------- Recommendations  Follow up growth in 4 weeks. ----------------------------------------------------------------------               Sander Nephew, MD Electronically Signed Final Report   06/13/2022 01:45 pm ----------------------------------------------------------------------    Assessment/Plan: 31 y/o [id number] at 31 weeks 1 day EGA with vaginal bleeding, fourth presentation for vaginal bleeding after [redacted] weeks EGA, with stable maternal and fetus status, - Admit to [facility]. - Continuous fetal monitoring.  - NICU was notified of patient  and  is okay with patient remain admitted if stable, but if needs delivery soon her baby may have to be transferred out.   - MFM consultation in the morning.   - Follow up on labs (CBC, KB stain) and ultrasound.  Archie Endo 06/27/2022, 11:12 PM

## 2022-06-27 NOTE — MAU Note (Signed)
.  Kristen Padilla Kristen Padilla is a 31 y.o. at [redacted]w[redacted]d here in MAU reporting VB since 1830. Was d/c home after admission recently due to VB with ? Abruption. Having mild cramping. Reports good FM  Onset of complaint: 1830 Pain score: 5 Vitals:   06/27/22 1937 06/27/22 1938  BP:  111/65  Pulse: 83   Resp: 17   Temp: 98.3 F (36.8 C)   SpO2: 99%      FHT:138 Lab orders placed from triage:   none

## 2022-06-28 ENCOUNTER — Inpatient Hospital Stay (HOSPITAL_BASED_OUTPATIENT_CLINIC_OR_DEPARTMENT_OTHER): Payer: Medicaid Other

## 2022-06-28 DIAGNOSIS — O4693 Antepartum hemorrhage, unspecified, third trimester: Secondary | ICD-10-CM

## 2022-06-28 DIAGNOSIS — O99843 Bariatric surgery status complicating pregnancy, third trimester: Secondary | ICD-10-CM | POA: Diagnosis not present

## 2022-06-28 DIAGNOSIS — O4593 Premature separation of placenta, unspecified, third trimester: Secondary | ICD-10-CM | POA: Diagnosis present

## 2022-06-28 DIAGNOSIS — Z3A31 31 weeks gestation of pregnancy: Secondary | ICD-10-CM

## 2022-06-28 DIAGNOSIS — O99213 Obesity complicating pregnancy, third trimester: Secondary | ICD-10-CM

## 2022-06-28 DIAGNOSIS — O09293 Supervision of pregnancy with other poor reproductive or obstetric history, third trimester: Secondary | ICD-10-CM

## 2022-06-28 DIAGNOSIS — E669 Obesity, unspecified: Secondary | ICD-10-CM

## 2022-06-28 HISTORY — DX: Premature separation of placenta, unspecified, third trimester: O45.93

## 2022-06-28 LAB — GLUCOSE, CAPILLARY
Glucose-Capillary: 84 mg/dL (ref 70–99)
Glucose-Capillary: 113 mg/dL — ABNORMAL HIGH (ref 70–99)
Glucose-Capillary: 125 mg/dL — ABNORMAL HIGH (ref 70–99)

## 2022-06-28 LAB — URINALYSIS, ROUTINE W REFLEX MICROSCOPIC
Bacteria, UA: NONE SEEN
Bilirubin Urine: NEGATIVE
Glucose, UA: 50 mg/dL — AB
Ketones, ur: 5 mg/dL — AB
Leukocytes,Ua: NEGATIVE
Nitrite: NEGATIVE
Protein, ur: NEGATIVE mg/dL
Specific Gravity, Urine: 1.008 (ref 1.005–1.030)
pH: 5 (ref 5.0–8.0)

## 2022-06-28 LAB — CBC WITH DIFFERENTIAL/PLATELET
Abs Immature Granulocytes: 0.13 10*3/uL — ABNORMAL HIGH (ref 0.00–0.07)
Basophils Absolute: 0 10*3/uL (ref 0.0–0.1)
Basophils Relative: 0 %
Eosinophils Absolute: 0.2 10*3/uL (ref 0.0–0.5)
Eosinophils Relative: 1 %
HCT: 30.3 % — ABNORMAL LOW (ref 36.0–46.0)
Hemoglobin: 10.4 g/dL — ABNORMAL LOW (ref 12.0–15.0)
Immature Granulocytes: 1 %
Lymphocytes Relative: 22 %
Lymphs Abs: 2.5 10*3/uL (ref 0.7–4.0)
MCH: 29.5 pg (ref 26.0–34.0)
MCHC: 34.3 g/dL (ref 30.0–36.0)
MCV: 86.1 fL (ref 80.0–100.0)
Monocytes Absolute: 0.7 10*3/uL (ref 0.1–1.0)
Monocytes Relative: 6 %
Neutro Abs: 7.7 10*3/uL (ref 1.7–7.7)
Neutrophils Relative %: 70 %
Platelets: 319 10*3/uL (ref 150–400)
RBC: 3.52 MIL/uL — ABNORMAL LOW (ref 3.87–5.11)
RDW: 11.6 % (ref 11.5–15.5)
WBC: 11.2 10*3/uL — ABNORMAL HIGH (ref 4.0–10.5)
nRBC: 0 % (ref 0.0–0.2)

## 2022-06-28 LAB — KLEIHAUER-BETKE STAIN
# Vials RhIg: 1
Fetal Cells %: 0 %
Quantitation Fetal Hemoglobin: 0 mL

## 2022-06-28 MED ORDER — FENTANYL CITRATE (PF) 100 MCG/2ML IJ SOLN: 50.0000 ug | INTRAMUSCULAR | Status: DC | PRN

## 2022-06-28 NOTE — Progress Notes (Signed)
Pt without complaints.  No leakage of fluid or VB.  Good FM  BP 120/62 (BP Location: Right Arm)   Pulse 95   Temp 97.7 F (36.5 C) (Oral)   Resp 17   Ht 5\' 2"  (1.575 m)   Wt 94.8 kg   LMP 11/13/2021 (Exact Date)   SpO2 100%   BMI 38.23 kg/m   FHTS  cat 1  Toco none  Pt in NAD CV RRR Lungs CTAB abd  Gravid soft and NT GU no  activevb EXt no calf tenderness Results for orders placed or performed during the hospital encounter of 06/27/22 (from the past 72 hour(s))  Type and screen [facility]     Status: None   Collection Time: 06/27/22  9:00 PM  Result Value Ref Range   ABO/RH(D) O NEG    Antibody Screen POS    Sample Expiration 06/30/2022,2359    Antibody Identification      PASSIVELY ACQUIRED ANTI-D Performed at [facility], [address], [place], [place]   Kleihauer-Betke stain     Status: None   Collection Time: 06/27/22  9:10 PM  Result Value Ref Range   Fetal Cells % 0 %   Quantitation Fetal Hemoglobin 0.0000 UP TO 15 mL   # Vials RhIg 1     Comment: Performed at [facility], [address] [place], [place]  CBC with Differential/Platelet     Status: Abnormal   Collection Time: 06/28/22 12:36 AM  Result Value Ref Range   WBC 11.2 (H) 4.0 - 10.5 K/uL   RBC 3.52 (L) 3.87 - 5.11 MIL/uL   Hemoglobin 10.4 (L) 12.0 - 15.0 g/dL   HCT 06/30/22 (L) 10.1 - 75.1 %   MCV 86.1 80.0 - 100.0 fL   MCH 29.5 26.0 - 34.0 pg   MCHC 34.3 30.0 - 36.0 g/dL   RDW 02.5 85.2 - 77.8 %   Platelets 319 150 - 400 K/uL   nRBC 0.0 0.0 - 0.2 %   Neutrophils Relative % 70 %   Neutro Abs 7.7 1.7 - 7.7 K/uL   Lymphocytes Relative 22 %   Lymphs Abs 2.5 0.7 - 4.0 K/uL   Monocytes Relative 6 %   Monocytes Absolute 0.7 0.1 - 1.0 K/uL   Eosinophils Relative 1 %   Eosinophils Absolute 0.2 0.0 - 0.5 K/uL   Basophils Relative 0 %   Basophils Absolute 0.0 0.0 - 0.1 K/uL   Immature Granulocytes 1 %   Abs Immature Granulocytes 0.13 (H) 0.00  - 0.07 K/uL    Comment: Performed at [facility], [address] [place], [place]  Urinalysis, Routine w reflex microscopic Urine, Clean Catch     Status: Abnormal   Collection Time: 06/28/22  1:00 AM  Result Value Ref Range   Color, Urine STRAW (A) YELLOW   APPearance CLEAR CLEAR   Specific Gravity, Urine 1.008 1.005 - 1.030   pH 5.0 5.0 - 8.0   Glucose, UA 50 (A) NEGATIVE mg/dL   Hgb urine dipstick MODERATE (A) NEGATIVE   Bilirubin Urine NEGATIVE NEGATIVE   Ketones, ur 5 (A) NEGATIVE mg/dL   Protein, ur NEGATIVE NEGATIVE mg/dL   Nitrite NEGATIVE NEGATIVE   Leukocytes,Ua NEGATIVE NEGATIVE   RBC / HPF 0-5 0 - 5 RBC/hpf   WBC, UA 0-5 0 - 5 WBC/hpf   Bacteria, UA NONE SEEN NONE SEEN   Squamous Epithelial / LPF 0-5 0 -  5 /HPF   Mucus PRESENT     Comment: Performed at [facility], [place] [address] [place], [place] [address]  Glucose, capillary     Status: None   Collection Time: 06/28/22  6:23 AM  Result Value Ref Range   Glucose-Capillary 84 70 - 99 mg/dL    Comment: Glucose reference range applies only to samples taken after fasting for at least 8 hours.  Glucose, capillary     Status: Abnormal   Collection Time: 06/28/22  9:18 AM  Result Value Ref Range   Glucose-Capillary 113 (H) 70 - 99 mg/dL    Comment: Glucose reference range applies only to samples taken after fasting for at least 8 hours.    Assessment and Plan [redacted]w[redacted]d  Third trimester bleeding.  Fourth episode Recommend pt stay to until delivery  [place] MFM Fetal status is reassuring will do intermittent monitoring May have wheel chair ride Type and screen q 3 days Monitor closely

## 2022-06-29 ENCOUNTER — Inpatient Hospital Stay (HOSPITAL_COMMUNITY): Payer: Medicaid Other | Admitting: Anesthesiology

## 2022-06-29 ENCOUNTER — Encounter (HOSPITAL_COMMUNITY)
Admission: AD | Disposition: A | Discharge status: Home / Self Care | Payer: Self-pay | Source: Home / Self Care | Attending: Obstetrics & Gynecology

## 2022-06-29 DIAGNOSIS — O4593 Premature separation of placenta, unspecified, third trimester: Secondary | ICD-10-CM

## 2022-06-29 DIAGNOSIS — Z3A31 31 weeks gestation of pregnancy: Secondary | ICD-10-CM

## 2022-06-29 DIAGNOSIS — O24420 Gestational diabetes mellitus in childbirth, diet controlled: Secondary | ICD-10-CM

## 2022-06-29 DIAGNOSIS — O321XX0 Maternal care for breech presentation, not applicable or unspecified: Secondary | ICD-10-CM

## 2022-06-29 LAB — GLUCOSE, CAPILLARY
Glucose-Capillary: 79 mg/dL (ref 70–99)
Glucose-Capillary: 84 mg/dL (ref 70–99)
Glucose-Capillary: 131 mg/dL — ABNORMAL HIGH (ref 70–99)
Glucose-Capillary: 73 mg/dL (ref 70–99)
Glucose-Capillary: 78 mg/dL (ref 70–99)

## 2022-06-29 LAB — URINE DRUGS OF ABUSE SCREEN W ALC, ROUTINE (REF LAB)
Amphetamines, Urine: NEGATIVE ng/mL
Barbiturate, Ur: NEGATIVE ng/mL
Benzodiazepine Quant, Ur: NEGATIVE ng/mL
Cannabinoid Quant, Ur: NEGATIVE ng/mL
Cocaine (Metab.): NEGATIVE ng/mL
Ethanol U, Quan: NEGATIVE %
Methadone Screen, Urine: NEGATIVE ng/mL
Opiate Quant, Ur: NEGATIVE ng/mL
Phencyclidine, Ur: NEGATIVE ng/mL
Propoxyphene, Urine: NEGATIVE ng/mL

## 2022-06-29 SURGERY — Surgical Case
Anesthesia: General

## 2022-06-29 MED ORDER — FENTANYL CITRATE (PF) 100 MCG/2ML IJ SOLN
INTRAMUSCULAR | Status: AC
  Filled 2022-06-29: qty 2

## 2022-06-29 MED ORDER — ONDANSETRON HCL 4 MG/2ML IJ SOLN
INTRAMUSCULAR | Status: AC
  Filled 2022-06-29: qty 2

## 2022-06-29 MED ORDER — BETAMETHASONE SOD PHOS & ACET 6 (3-3) MG/ML IJ SUSP: 12.0000 mg | INTRAMUSCULAR | Status: DC

## 2022-06-29 MED ORDER — LIDOCAINE 2% (20 MG/ML) 5 ML SYRINGE
INTRAMUSCULAR | Status: AC
  Filled 2022-06-29: qty 5

## 2022-06-29 MED ORDER — SUCCINYLCHOLINE CHLORIDE 200 MG/10ML IV SOSY
PREFILLED_SYRINGE | INTRAVENOUS | Status: AC
  Filled 2022-06-29: qty 10

## 2022-06-29 MED ORDER — PROMETHAZINE HCL 25 MG/ML IJ SOLN: 6.2500 mg | INTRAMUSCULAR | Status: DC | PRN

## 2022-06-29 MED ORDER — SENNOSIDES-DOCUSATE SODIUM 8.6-50 MG PO TABS
2.0000 | ORAL_TABLET | Freq: Every day | ORAL | Status: DC
  Administered 2022-06-30 – 2022-07-01 (×2): 2 via ORAL
  Filled 2022-06-29 (×2): qty 2

## 2022-06-29 MED ORDER — FENTANYL CITRATE (PF) 100 MCG/2ML IJ SOLN
INTRAMUSCULAR | Status: DC | PRN
  Administered 2022-06-29: 100 ug via EPIDURAL
  Administered 2022-06-29 (×2): 50 ug via EPIDURAL
  Administered 2022-06-29: 100 ug via EPIDURAL

## 2022-06-29 MED ORDER — SOD CITRATE-CITRIC ACID 500-334 MG/5ML PO SOLN
ORAL | Status: AC
  Filled 2022-06-29: qty 30

## 2022-06-29 MED ORDER — HYDROMORPHONE HCL 1 MG/ML IJ SOLN: 0.2000 mg | INTRAMUSCULAR | Status: DC | PRN

## 2022-06-29 MED ORDER — LIDOCAINE HCL (CARDIAC) PF 100 MG/5ML IV SOSY
PREFILLED_SYRINGE | INTRAVENOUS | Status: DC | PRN
  Administered 2022-06-29: 60 mg via INTRAVENOUS

## 2022-06-29 MED ORDER — MEASLES, MUMPS & RUBELLA VAC IJ SOLR
0.5000 mL | Freq: Once | INTRAMUSCULAR | Status: AC
  Administered 2022-06-30: 0.5 mL via SUBCUTANEOUS
  Filled 2022-06-29: qty 0.5

## 2022-06-29 MED ORDER — KETOROLAC TROMETHAMINE 30 MG/ML IJ SOLN
30.0000 mg | Freq: Four times a day (QID) | INTRAMUSCULAR | Status: AC
  Administered 2022-06-29 – 2022-06-30 (×4): 30 mg via INTRAVENOUS
  Filled 2022-06-29 (×3): qty 1

## 2022-06-29 MED ORDER — SCOPOLAMINE 1 MG/3DAYS TD PT72
MEDICATED_PATCH | TRANSDERMAL | Status: DC | PRN
  Administered 2022-06-29: 1 via TRANSDERMAL

## 2022-06-29 MED ORDER — LACTATED RINGERS IV SOLN: INTRAVENOUS | Status: DC

## 2022-06-29 MED ORDER — ZOLPIDEM TARTRATE 5 MG PO TABS: 5.0000 mg | ORAL_TABLET | Freq: Every evening | ORAL | Status: DC | PRN

## 2022-06-29 MED ORDER — SODIUM CHLORIDE 0.9 % IR SOLN
Status: DC | PRN
  Administered 2022-06-29: 1

## 2022-06-29 MED ORDER — MIDAZOLAM HCL 2 MG/2ML IJ SOLN
INTRAMUSCULAR | Status: AC
  Filled 2022-06-29: qty 2

## 2022-06-29 MED ORDER — SUFENTANIL CITRATE 50 MCG/ML IV SOLN: INTRAVENOUS | Status: DC | PRN

## 2022-06-29 MED ORDER — MIDAZOLAM HCL 2 MG/2ML IJ SOLN: INTRAMUSCULAR | Status: DC | PRN

## 2022-06-29 MED ORDER — FLEET ENEMA 7-19 GM/118ML RE ENEM: 1.0000 | ENEMA | Freq: Every day | RECTAL | Status: DC | PRN

## 2022-06-29 MED ORDER — LACTATED RINGERS IV BOLUS: 500.0000 mL | Freq: Once | INTRAVENOUS | Status: DC

## 2022-06-29 MED ORDER — FENTANYL CITRATE (PF) 100 MCG/2ML IJ SOLN: INTRAMUSCULAR | Status: DC | PRN

## 2022-06-29 MED ORDER — SCOPOLAMINE 1 MG/3DAYS TD PT72
MEDICATED_PATCH | TRANSDERMAL | Status: AC
  Filled 2022-06-29: qty 1

## 2022-06-29 MED ORDER — DIPHENHYDRAMINE HCL 25 MG PO CAPS: 25.0000 mg | ORAL_CAPSULE | Freq: Four times a day (QID) | ORAL | Status: DC | PRN

## 2022-06-29 MED ORDER — DEXAMETHASONE SODIUM PHOSPHATE 10 MG/ML IJ SOLN: INTRAMUSCULAR | Status: DC | PRN

## 2022-06-29 MED ORDER — SIMETHICONE 80 MG PO CHEW: 80.0000 mg | CHEWABLE_TABLET | ORAL | Status: DC | PRN

## 2022-06-29 MED ORDER — MENTHOL 3 MG MT LOZG: 1.0000 | LOZENGE | OROMUCOSAL | Status: DC | PRN

## 2022-06-29 MED ORDER — DEXAMETHASONE SODIUM PHOSPHATE 10 MG/ML IJ SOLN
INTRAMUSCULAR | Status: DC | PRN
  Administered 2022-06-29: 10 mg

## 2022-06-29 MED ORDER — OXYTOCIN-SODIUM CHLORIDE 30-0.9 UT/500ML-% IV SOLN
2.5000 [IU]/h | INTRAVENOUS | Status: AC
  Administered 2022-06-29: 2.5 [IU]/h via INTRAVENOUS
  Filled 2022-06-29: qty 500

## 2022-06-29 MED ORDER — IBUPROFEN 600 MG PO TABS
600.0000 mg | ORAL_TABLET | Freq: Four times a day (QID) | ORAL | Status: DC
  Administered 2022-07-01 (×3): 600 mg via ORAL
  Filled 2022-06-29 (×3): qty 1

## 2022-06-29 MED ORDER — DIBUCAINE (PERIANAL) 1 % EX OINT: 1.0000 | TOPICAL_OINTMENT | CUTANEOUS | Status: DC | PRN

## 2022-06-29 MED ORDER — SUCCINYLCHOLINE CHLORIDE 200 MG/10ML IV SOSY
PREFILLED_SYRINGE | INTRAVENOUS | Status: DC | PRN
  Administered 2022-06-29: 160 mg via INTRAVENOUS

## 2022-06-29 MED ORDER — FENTANYL CITRATE (PF) 100 MCG/2ML IJ SOLN
25.0000 ug | INTRAMUSCULAR | Status: DC | PRN
  Administered 2022-06-29 (×3): 50 ug via INTRAVENOUS

## 2022-06-29 MED ORDER — OXYTOCIN-SODIUM CHLORIDE 30-0.9 UT/500ML-% IV SOLN
INTRAVENOUS | Status: AC
  Filled 2022-06-29: qty 500

## 2022-06-29 MED ORDER — COCONUT OIL OIL: 1.0000 | TOPICAL_OIL | Status: DC | PRN

## 2022-06-29 MED ORDER — BISACODYL 10 MG RE SUPP: 10.0000 mg | Freq: Every day | RECTAL | Status: DC | PRN

## 2022-06-29 MED ORDER — OXYTOCIN-SODIUM CHLORIDE 30-0.9 UT/500ML-% IV SOLN
INTRAVENOUS | Status: DC | PRN
  Administered 2022-06-29: 30 [IU] via INTRAVENOUS

## 2022-06-29 MED ORDER — PROPOFOL 10 MG/ML IV BOLUS
INTRAVENOUS | Status: AC
  Filled 2022-06-29: qty 20

## 2022-06-29 MED ORDER — STERILE WATER FOR IRRIGATION IR SOLN
Status: DC | PRN
  Administered 2022-06-29: 1

## 2022-06-29 MED ORDER — MIDAZOLAM HCL 2 MG/2ML IJ SOLN
INTRAMUSCULAR | Status: DC | PRN
  Administered 2022-06-29: 2 mg via INTRAMUSCULAR

## 2022-06-29 MED ORDER — LACTATED RINGERS IV SOLN: INTRAVENOUS | Status: DC | PRN

## 2022-06-29 MED ORDER — ACETAMINOPHEN 500 MG PO TABS
1000.0000 mg | ORAL_TABLET | Freq: Four times a day (QID) | ORAL | Status: DC
  Administered 2022-06-30 – 2022-07-01 (×6): 1000 mg via ORAL
  Filled 2022-06-29 (×6): qty 2

## 2022-06-29 MED ORDER — ONDANSETRON HCL 4 MG/2ML IJ SOLN: INTRAMUSCULAR | Status: DC | PRN

## 2022-06-29 MED ORDER — OXYCODONE HCL 5 MG PO TABS
5.0000 mg | ORAL_TABLET | ORAL | Status: DC | PRN
  Administered 2022-06-30 – 2022-07-01 (×5): 5 mg via ORAL
  Filled 2022-06-29 (×5): qty 1

## 2022-06-29 MED ORDER — FERROUS SULFATE 325 (65 FE) MG PO TABS
325.0000 mg | ORAL_TABLET | ORAL | Status: DC
  Administered 2022-06-30: 325 mg via ORAL
  Filled 2022-06-29: qty 1

## 2022-06-29 MED ORDER — DEXAMETHASONE SODIUM PHOSPHATE 10 MG/ML IJ SOLN
INTRAMUSCULAR | Status: AC
  Filled 2022-06-29: qty 1

## 2022-06-29 MED ORDER — PROPOFOL 10 MG/ML IV BOLUS
INTRAVENOUS | Status: DC | PRN
  Administered 2022-06-29: 180 mg via INTRAVENOUS

## 2022-06-29 MED ORDER — KETOROLAC TROMETHAMINE 30 MG/ML IJ SOLN
INTRAMUSCULAR | Status: AC
  Filled 2022-06-29: qty 1

## 2022-06-29 MED ORDER — WITCH HAZEL-GLYCERIN EX PADS: 1.0000 | MEDICATED_PAD | CUTANEOUS | Status: DC | PRN

## 2022-06-29 MED ORDER — ACETAMINOPHEN 10 MG/ML IV SOLN
INTRAVENOUS | Status: DC | PRN
  Administered 2022-06-29: 1000 mg via INTRAVENOUS

## 2022-06-29 MED ORDER — ACETAMINOPHEN 10 MG/ML IV SOLN
INTRAVENOUS | Status: AC
  Filled 2022-06-29: qty 100

## 2022-06-29 MED ORDER — CEFAZOLIN SODIUM-DEXTROSE 2-3 GM-%(50ML) IV SOLR
INTRAVENOUS | Status: DC | PRN
  Administered 2022-06-29: 2 g via INTRAVENOUS

## 2022-06-29 SURGICAL SUPPLY — 39 items
BENZOIN TINCTURE PRP APPL 2/3 (GAUZE/BANDAGES/DRESSINGS) IMPLANT
CHLORAPREP W/TINT 26 (MISCELLANEOUS) ×2 IMPLANT
CLAMP UMBILICAL CORD (MISCELLANEOUS) ×1 IMPLANT
CLOTH BEACON ORANGE TIMEOUT ST (SAFETY) ×1 IMPLANT
DERMABOND ADVANCED .7 DNX12 (GAUZE/BANDAGES/DRESSINGS) IMPLANT
DRAPE C SECTION CLR SCREEN (DRAPES) ×1 IMPLANT
DRSG OPSITE POSTOP 4X10 (GAUZE/BANDAGES/DRESSINGS) ×1 IMPLANT
ELECT REM PT RETURN 9FT ADLT (ELECTROSURGICAL) ×1
ELECTRODE REM PT RTRN 9FT ADLT (ELECTROSURGICAL) ×1 IMPLANT
EXTRACTOR VACUUM KIWI (MISCELLANEOUS) ×1 IMPLANT
GAUZE SPONGE 4X4 12PLY STRL LF (GAUZE/BANDAGES/DRESSINGS) IMPLANT
GLOVE BIOGEL PI IND STRL 7.0 (GLOVE) ×2 IMPLANT
GLOVE SURG SS PI 6.5 STRL IVOR (GLOVE) ×1 IMPLANT
GOWN STRL REUS W/TWL LRG LVL3 (GOWN DISPOSABLE) ×2 IMPLANT
KIT ABG SYR 3ML LUER SLIP (SYRINGE) IMPLANT
NDL HYPO 25X5/8 SAFETYGLIDE (NEEDLE) IMPLANT
NDL KEITH (NEEDLE) ×1 IMPLANT
NEEDLE HYPO 25X5/8 SAFETYGLIDE (NEEDLE) IMPLANT
NEEDLE KEITH (NEEDLE) ×1 IMPLANT
NS IRRIG 1000ML POUR BTL (IV SOLUTION) ×1 IMPLANT
PACK C SECTION WH (CUSTOM PROCEDURE TRAY) ×1 IMPLANT
PAD ABD 7.5X8 STRL (GAUZE/BANDAGES/DRESSINGS) IMPLANT
PAD OB MATERNITY 4.3X12.25 (PERSONAL CARE ITEMS) ×1 IMPLANT
RTRCTR C-SECT PINK 25CM LRG (MISCELLANEOUS) ×1 IMPLANT
STRIP CLOSURE SKIN 1/2X4 (GAUZE/BANDAGES/DRESSINGS) IMPLANT
SUT CHROMIC 1 CTX 36 (SUTURE) IMPLANT
SUT CHROMIC 2 0 CT 1 (SUTURE) ×1 IMPLANT
SUT PLAIN 1 NONE 54 (SUTURE) IMPLANT
SUT PLAIN 2 0 (SUTURE)
SUT PLAIN 2 0 XLH (SUTURE) IMPLANT
SUT PLAIN ABS 2-0 CT1 27XMFL (SUTURE) IMPLANT
SUT VIC AB 0 CTX 36 (SUTURE) ×1
SUT VIC AB 0 CTX36XBRD ANBCTRL (SUTURE) ×1 IMPLANT
SUT VIC AB 1 CTX 36 (SUTURE) ×2
SUT VIC AB 1 CTX36XBRD ANBCTRL (SUTURE) ×2 IMPLANT
SUT VIC AB 4-0 KS 27 (SUTURE) IMPLANT
TOWEL OR 17X24 6PK STRL BLUE (TOWEL DISPOSABLE) ×1 IMPLANT
TRAY FOLEY W/BAG SLVR 14FR LF (SET/KITS/TRAYS/PACK) ×1 IMPLANT
WATER STERILE IRR 1000ML POUR (IV SOLUTION) ×1 IMPLANT

## 2022-06-29 NOTE — Anesthesia Preprocedure Evaluation (Signed)
Anesthesia Evaluation  Patient identified by MRN, date of birth, ID band Patient awake    Reviewed: Allergy & Precautions, NPO status , Patient's Chart, lab work & pertinent test results, Unable to perform ROS - Chart review onlyPreop documentation limited or incomplete due to emergent nature of procedure.  Airway Mallampati: II  TM Distance: >3 FB Neck ROM: Full    Dental  (+) Teeth Intact, Dental Advisory Given   Pulmonary neg pulmonary ROS   Pulmonary exam normal breath sounds clear to auscultation       Cardiovascular negative cardio ROS  Rhythm:Regular Rate:Tachycardia     Neuro/Psych  PSYCHIATRIC DISORDERS Anxiety Depression    negative neurological ROS     GI/Hepatic negative GI ROS, Neg liver ROS,,,  Endo/Other  negative endocrine ROS  Obesity   Renal/GU negative Renal ROS     Musculoskeletal negative musculoskeletal ROS (+)    Abdominal   Peds  Hematology negative hematology ROS (+)   Anesthesia Other Findings Day of surgery medications reviewed with the patient.  Reproductive/Obstetrics (+) Pregnancy Suspected placental abruption                             Anesthesia Physical Anesthesia Plan  ASA: 2 and emergent  Anesthesia Plan: General   Post-op Pain Management: Ofirmev IV (intra-op)*   Induction: Intravenous and Cricoid pressure planned  PONV Risk Score and Plan: 3 and Midazolam, Dexamethasone and Ondansetron  Airway Management Planned: Oral ETT and Video Laryngoscope Planned  Additional Equipment:   Intra-op Plan:   Post-operative Plan: Extubation in OR  Informed Consent: I have reviewed the patients History and Physical, chart, labs and discussed the procedure including the risks, benefits and alternatives for the proposed anesthesia with the patient or authorized representative who has indicated his/her understanding and acceptance.     Only emergency  history available and Dental advisory given  Plan Discussed with: CRNA  Anesthesia Plan Comments: (Pre-op completed after induction due to emergent nature of procedure. CODE CESAREAN.)       Anesthesia Quick Evaluation

## 2022-06-29 NOTE — Progress Notes (Signed)
Subjective:    Denies bleeding in past 24 hrs and reports active FM.   Objective:    VS: BP 97/60 (BP Location: Right Arm)   Pulse 75   Temp 98 F (36.7 C) (Oral)   Resp 16   Ht 5\' 2"  (1.575 m)   Wt 94.8 kg   LMP 11/13/2021 (Exact Date)   SpO2 100%   BMI 38.23 kg/m  NST from 2200 : baseline 135 / variability moderate / accelerations present / absent decelerations Toco: none per TOCO Membranes: intact    Assessment/Plan:   31 y.o. 26 [redacted]w[redacted]d  Third trimester bleeding    -no bleeding in past 24 hrs Consult between MD to MFM Last limited U/S on 12/29    -cephalic, anterior placenta, AFI 10     -next 1/30 sched 07/18/22 RH neg     -last rhogam 04/09/22 and 06/17/22 A1GDM    -fasting and 2 hr PP CBG Reassuring NST overnight BMZ 12/18 & 12/19 GBS neg  Plan discussed with Dr.Davies and Dr. 1/20 DNP, CNM 06/29/2022 6:51 AM

## 2022-06-29 NOTE — Op Note (Addendum)
Patient: Kristen Padilla DOB:  [birth year] MRN: [id number]  DATE OF SURGERY: 06/29/2022  PREOP DIAGNOSIS:  1. 31 week 3 days EGA intrauterine pregnancy. 2. Heavy bleeding in third trimester, presumed placenta abruption. 3. Abnormal fetal tracing with recurrent variable decelerations.    POSTOP DIAGNOSIS:  1. 31 week 3 days EGA intrauterine pregnancy. 2. Heavy bleeding in third trimester, presumed placenta abruption. 3. Abnormal fetal tracing with recurrent variable decelerations. 4. Breech presentation.   PROCEDURE: Emergency Primary low uterine segment transverse cesarean section via Pfannenstiel incision.     SURGEON: Dr.  Hoover Browns.  ASSISTANT: Dr. Lanae Crumbly, Shanda Bumps.  SURGEON ATTESTATION: An experienced assistant was required given the standard of surgical care given the complexity of the case. This assistant was needed for exposure, dissection, suctioning, retraction, instrument exchange,  assisting with delivery with administration of fundal pressure, and for overall help during the procedure   ANESTHESIA: General Endotracheal Anesthesia.   COMPLICATIONS: None.  FINDINGS: Viable female infant in single footling breech presentation.  Weight 4 lbs 3.4 oz, Apgar scores of 8 and 9. Normal uterus and fallopian tubes and ovaries bilaterally.  Placenta with a 3 x 3 cm area of placenta detachment and retroplacental clot was noted on one edge of the placenta.    EBL:  575 cc.  IV FLUID:  1500 cc LR   URINE OUTPUT: 200 cc clear urine  INDICATIONS:  31 y/o [id number] at 31 weeks 3 days EGA who had been admitted for third trimester vaginal bleeding, presumed placenta abruption.  I was called by RN for renewed heavy vaginal bleeding.  On arrival bleeding in the toilet bowl was significant, with multiple toilet papers used to wipe off bright red blood by the patient in the toilet bowl.  Her cervix was 1.5 cm and thick. Patient was complaining of abdominal pain, had abdominal  tenderness and fetal recurrent variable decelerations were noted on the monitor.  I recommended proceeding with cesarean delivery due to concerns for fetal and maternal status.  A emergency cesarean section was called for.  She was consented for the procedure after explaining risks benefits and alternatives of the procedure including but not limited to risks of bleeding, infection and damage to organs.    PROCEDURE:   She was quickly taken to the operating room where her abdomen was prepped with betadine, foley catheter was placed in and she was draped in the usual sterile fashion.  She received 2 g of IV Ancef preoperatively. General anesthesia was administered.  A Pfannenstiel incision was made with the scalpel and the incision extended through the subcutaneous layer and fascia with the scalpel.  The fascia was nicked in the midline and then bluntly separated from the muscles bilaterally.  The peritoneal cavity was entered bluntly with the fingers. The bladder blade and right angle retractor were placed in. The uterus was incised with a scalpel above the bladder peritoneal fold and the incision extended bilaterally with bandage scissors as it was noted to be thickened. Membranes were ruptured and moderate clear amniotic fluid was noted. A fetal foot was encountered, this was grasped and the brought to the incision. The second foot was identified and also brought to the incision and both feel delivered.  The sacrum was brought to the incision and delivered.  Gentle traction was applied on the hips to deliver the trunk.  Baby was rotated to its left side and right arm was delivered.  Baby was then rotated to its right side and the left  arm was delivered.  Maxillary pressure was applied and abdominal pressure applied to deliver the head.  She delivered a viable female infant, apgar scores 8, 9.  Baby was delivered within 2 minutes of starting the procedure.  The uterus was not exteriorized.  The edges of the uterus  was grasped with clamps.  The cord was clamped and cut after 1 minute. Cord blood was collected.  The placenta was delivered with gentle traction on the umbilical cord. The uterus was cleared of clots and debris with a lap.  The uterine incision was closed with #1 Vicryl in a running locked stitch. An imbricating layer of the same stitch was placed over the initial closure.  Irrigation was applied and suctioned out. Excellent hemostasis was noted over the incision.  The peritoneum were then reapproximated using chromic suture. Fascia was closed using 0 Vicryl in a running stitch. The subcutaneous layer was irrigated and suctioned out. Small perforators were contained with the bovie.  The subcutaneous layer was closed using 1-0 plain in interrupted stitches. The skin was closed using the keith needle with vicryl.  Benzocaine and steri strips were applied.  Honeycomb was then applied. The patient was then cleaned and she was taken to the recovery room with her baby in stable conditions.  The baby had already been taken to the NICU in stable condition and with CPAP on.     SPECIMEN: Placenta to pathology.  Umbilical cord blood to lab.   DISPOSITION: MOTHER TO PACU, STABLE.  BABY TO NICU, STABLE.    Dr. Hoover Browns.    06/29/22. 8:27 PM.

## 2022-06-29 NOTE — Progress Notes (Signed)
Dr. Sallye Ober and RN at bedside. FHR audible decrease auscultated by RN and MD; noted on monitor following a moderate vaginal bleed with maternal abdominal pain and tenderness rated 8/10. Stat C-Section called and patient transported to OR.

## 2022-06-29 NOTE — Transfer of Care (Signed)
Immediate Anesthesia Transfer of Care Note  Patient: Kristen Padilla  Procedure(s) Performed: CESAREAN SECTION  Patient Location: PACU  Anesthesia Type:General  Level of Consciousness: awake, alert , and oriented  Airway & Oxygen Therapy: Patient Spontanous Breathing and Patient connected to nasal cannula oxygen  Post-op Assessment: Report given to RN and Post -op Vital signs reviewed and stable  Post vital signs: Reviewed and stable  Last Vitals:  Vitals Value Taken Time  BP 124/78 06/29/22 1822  Temp    Pulse 105 06/29/22 1827  Resp 16 06/29/22 1827  SpO2 98 % 06/29/22 1827  Vitals shown include unvalidated device data.  Last Pain:  Vitals:   06/29/22 1632  TempSrc:   PainSc: 7       Patients Stated Pain Goal: 0 (06/27/22 1959)  Complications: No notable events documented.

## 2022-06-29 NOTE — Lactation Note (Signed)
This note was copied from a baby's chart. Lactation Consultation Note  Patient Name: Kristen Padilla Date: 06/29/2022   Age:30 hours Mom's feeding choice is formula. Maternal Data    Feeding    LATCH Score                    Lactation Tools Discussed/Used    Interventions    Discharge    Consult Status Consult Status: Complete    Stefanny Pieri G 06/29/2022, 10:58 PM

## 2022-06-29 NOTE — Progress Notes (Addendum)
Kristen Padilla Kristen Padilla Kristen Padilla is a 31 y.o. [id number] at [redacted]w[redacted]d by admitted for third trimester vaginal bleeding, presumed placenta abruption.   Subjective: Patient denies any complaints. She denies contractions or vaginal bleeding. With normal gross fetal movement.   Objective: BP 113/61 (BP Location: Right Arm)   Pulse 84   Temp 98.3 F (36.8 C) (Oral)   Resp 18   Ht 5\' 2"  (1.575 m)   Wt 94.8 kg   LMP 11/13/2021 (Exact Date)   SpO2 99%   BMI 38.23 kg/m  No intake/output data recorded. No intake/output data recorded. NST reviewed: 06/29/22 at 9 am.  FHT:  FHR: 120 bpm, variability: moderate,  accelerations:  Present,  decelerations:  Absent UC:   About 5 low amplitude contractions seen in a span of 30 minutes.  SVE:    Deferred.   Labs: Lab Results  Component Value Date   WBC 11.2 (H) 06/28/2022   HGB 10.4 (L) 06/28/2022   HCT 30.3 (L) 06/28/2022   MCV 86.1 06/28/2022   PLT 319 06/28/2022    Assessment / Plan: 31 y.o. 06/30/2022 at [redacted]w[redacted]d by admitted for third trimester vaginal bleeding, presumed placenta abruption, - Continue with inpatient management until delivery as per MFM.  - NST Q shift. - Discussed indications for delivery as per MFM recommendations and questions answered.  [redacted]w[redacted]d, MD 06/29/2022, 12:01 PM

## 2022-06-29 NOTE — Consult Note (Addendum)
Follow up MFM Consult   I connected with  Irven Coe ANN LEOS TORRES on 06/29/22 by a telephonic and  verified that I am speaking with the correct person using two identifiers.   I discussed the limitations of evaluation and management by telemedicine. The patient expressed understanding and agreed to proceed.  Ms. Orlin Hilding is a 31 yo G7P1 who is admitted at 31w 3d  for repeat vaginal bleeeding in the third trimester. She is consulted today telephonically at the request of Dr. Jaymes Graff.  Ms. Orlin Hilding was consulted previously by my partner Kristin Bruins, MD on 12/19 which she was monitored for bleeding and received BMZ.  On the 60 th Ms. [name] reported new onset vaginal spotting and then a large bleed. She denies a preceding event. She denies bleeding for 24 hours.  Her CBC and KB are normal. Coags were not drawn.      06/29/2022    7:48 AM 06/29/2022    3:29 AM 06/28/2022    8:02 PM  Vitals with BMI  Systolic 115 97 107  Diastolic 69 60 64  Pulse 90 75 95    No interval changes in her history.  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  I discussed with Ms. Torres the diagnosis, evaluation and management of chronic abruption.  We discussed that coagulation studies be drawn today and repeated in serially for 72 hours.  We discussed 2x daily NST with weekly BPP.  I explained that given this is here 4th bleed this pregnancy with a recent bleed on 12/19 I recommend she remain hospitalized until delivery.  I a significant bleed occurs again consider delivery with rescue steroids pending fetal status.  If bleeding occurs after 32 weeks consider delivery, including mild bleeding.  Plan for delivery between 34-37 weeks pending fetal status and maternal status.  Ms. Orlin Hilding expressed an understanding of this plan and has no further questions.  I discussed this plan of care with Dr. Normand Sloop as well.  I spent 30 minutes with > 50% in direct communication and  care coordination.  All questions answered.  Novella Olive, MD

## 2022-06-29 NOTE — Anesthesia Procedure Notes (Signed)
Procedure Name: Intubation Date/Time: 06/29/2022 5:12 PM  Performed by: Collene Schlichter, MDPre-anesthesia Checklist: Patient identified, Emergency Drugs available, Suction available and Patient being monitored Patient Re-evaluated:Patient Re-evaluated prior to induction Oxygen Delivery Method: Circle system utilized Preoxygenation: Pre-oxygenation with 100% oxygen Induction Type: IV induction, Rapid sequence and Cricoid Pressure applied Ventilation: Mask ventilation without difficulty Laryngoscope Size: Glidescope and 3 Grade View: Grade I Tube type: Oral Tube size: 7.0 mm Number of attempts: 1 Airway Equipment and Method: Stylet and Oral airway Placement Confirmation: ETT inserted through vocal cords under direct vision, positive ETCO2 and breath sounds checked- equal and bilateral Secured at: 20 cm Tube secured with: Tape Dental Injury: Teeth and Oropharynx as per pre-operative assessment

## 2022-06-30 LAB — CBC
HCT: 25.4 % — ABNORMAL LOW (ref 36.0–46.0)
Hemoglobin: 8.9 g/dL — ABNORMAL LOW (ref 12.0–15.0)
MCH: 30.2 pg (ref 26.0–34.0)
MCHC: 35 g/dL (ref 30.0–36.0)
MCV: 86.1 fL (ref 80.0–100.0)
Platelets: 284 10*3/uL (ref 150–400)
RBC: 2.95 MIL/uL — ABNORMAL LOW (ref 3.87–5.11)
RDW: 11.7 % (ref 11.5–15.5)
WBC: 17.8 10*3/uL — ABNORMAL HIGH (ref 4.0–10.5)
nRBC: 0 % (ref 0.0–0.2)

## 2022-06-30 LAB — APTT: aPTT: 25 seconds (ref 24–36)

## 2022-06-30 LAB — PROTIME-INR
INR: 1.1 (ref 0.8–1.2)
Prothrombin Time: 14.1 seconds (ref 11.4–15.2)

## 2022-06-30 LAB — FIBRINOGEN: Fibrinogen: 454 mg/dL (ref 210–475)

## 2022-06-30 LAB — GLUCOSE, CAPILLARY: Glucose-Capillary: 112 mg/dL — ABNORMAL HIGH (ref 70–99)

## 2022-06-30 MED ORDER — ONDANSETRON 4 MG PO TBDP: 4.0000 mg | ORAL_TABLET | Freq: Four times a day (QID) | ORAL | Status: DC | PRN

## 2022-06-30 MED ORDER — RHO D IMMUNE GLOBULIN 1500 UNIT/2ML IJ SOSY
300.0000 ug | PREFILLED_SYRINGE | Freq: Once | INTRAMUSCULAR | Status: AC
  Administered 2022-06-30: 300 ug via INTRAVENOUS
  Filled 2022-06-30: qty 2

## 2022-06-30 MED ORDER — ONDANSETRON HCL 4 MG/2ML IJ SOLN
4.0000 mg | Freq: Four times a day (QID) | INTRAMUSCULAR | Status: DC | PRN
  Administered 2022-06-30: 4 mg via INTRAVENOUS
  Filled 2022-06-30: qty 2

## 2022-06-30 NOTE — Anesthesia Postprocedure Evaluation (Signed)
Anesthesia Post Note  Patient: Kristen Padilla  Procedure(s) Performed: CESAREAN SECTION     Patient location during evaluation: PACU Anesthesia Type: General Level of consciousness: awake and alert Pain management: pain level controlled Vital Signs Assessment: post-procedure vital signs reviewed and stable Respiratory status: spontaneous breathing, nonlabored ventilation, respiratory function stable and patient connected to nasal cannula oxygen Cardiovascular status: blood pressure returned to baseline and stable Postop Assessment: no apparent nausea or vomiting Anesthetic complications: no   No notable events documented.  Last Vitals:  Vitals:   06/29/22 2035 06/30/22 0009  BP:  113/67  Pulse:  64  Resp:  18  Temp:  36.4 C  SpO2: 97% 100%    Last Pain:  Vitals:   06/30/22 0009  TempSrc: Oral  PainSc:    Pain Goal: Patients Stated Pain Goal: 0 (06/27/22 1959)                 Collene Schlichter

## 2022-06-30 NOTE — Lactation Note (Signed)
This note was copied from a baby's chart. Lactation Consultation Note  Patient Name: Kristen Padilla Date: 06/30/2022  NICU RN, Merleen Milliner, notified NICU lactation that patient requests lactation and would like to initiate pumping.     Age:30 hours  Kristen Padilla 06/30/2022, 9:30 AM

## 2022-06-30 NOTE — Progress Notes (Addendum)
Subjective: Postpartum Day 1: Cesarean Delivery Patient reports some incisional pain. She is tolerating a regular diet. She is ambulating and voiding without difficulty. She has small vaginal bleeding.   Objective: Vital signs in last 24 hours: Temp:  [97.6 F (36.4 C)-98.3 F (36.8 C)] 97.8 F (36.6 C) (12/31 0337) Pulse Rate:  [60-106] 60 (12/31 0337) Resp:  [12-26] 18 (12/31 0337) BP: (110-132)/(59-80) 130/72 (12/31 0337) SpO2:  [95 %-100 %] 99 % (12/31 0337)  Physical Exam:  General: alert, cooperative, and no distress Lochia: appropriate Uterine Fundus: firm Incision: With honey comb dressing, with 2 small areas with serosangenous drainage.  DVT Evaluation: No evidence of DVT seen on physical exam. No significant calf/ankle edema.  Recent Labs    06/28/22 0036 06/30/22 0602  HGB 10.4* 8.9*  HCT 30.3* 25.4*   06/30/22: Glucose 112.  06/30/22: Normal PT, PTT, INR, fibrinogen.   Assessment/Plan:  31 y/o [id number] POD # 1 s/p Emergency Cesarean section. Doing well postoperatively.  Continue current care. Iron tablets for anemia.  Out of bed and ambulation advised. Discussed with patient pre and intra-op events and findings.   Prescilla Sours, MD 06/30/2022, 7:29 AM

## 2022-06-30 NOTE — Lactation Note (Signed)
This note was copied from a baby's chart.  NICU Lactation Consultation Note  Patient Name: Kristen Padilla Date: 06/30/2022 Age:30 hours   Subjective Reason for consult: Initial assessment; 1st time breastfeeding; NICU baby; Preterm <34wks  Lactation was paged to conduct an initial visit with Lindzie on [facility]. Her initial plan was formula, but due to infant, [name]'s prematurity, she wishes to pump and provide breast milk. I provided basic education on pumping, helped her hand express and then stayed for a full pumping session. We were able to collect colostrum to take to NICU for oral care. Education on pumping, cleaning, and milk storage provided.  [place] may quality for a Stork Pump and requests a referral. Her OB is planning to follow up today. I left the paperwork with the [facility] as she states that she will be able to share with the provider when she rounds.   Objective Infant data: Mother's Current Feeding Choice: Breast Milk  Maternal data: [id number]  C-Section, Low Transverse  Current breast feeding challenges:: NICU  Previous breastfeeding challenges?: Other (Comment); Low milk supply; Lack of support (states that her milk did not "come in")  Does the patient have breastfeeding experience prior to this delivery?: Yes How long did the patient breastfeed?: attempted for 3 days  Pumping frequency: recommend q3 hours Pumped volume: 10 mL (lost some milk in the transfer - possibly 15) Flange Size: 24  Risk factor for low milk supply:: Lap-band surgery   Pump:  (Stork pump referral - left paperwork with Diplomatic Services operational officer on [employer])  Assessment Infant: No data recorded Feeding Status: NPO  Maternal: Milk volume: Normal   Intervention/Plan Interventions: Breast feeding basics reviewed; Hand express; DEBP; Education; Expressed milk  Tools: Pump; Flanges; Hands-free pumping top Pump Education: Setup, frequency, and cleaning; Milk  Storage  Plan: Consult Status: NICU follow-up  NICU Follow-up type: New admission follow up    Walker Shadow 06/30/2022, 11:34 AM

## 2022-07-01 ENCOUNTER — Encounter (HOSPITAL_COMMUNITY): Payer: Self-pay | Admitting: Obstetrics & Gynecology

## 2022-07-01 LAB — RH IG WORKUP (INCLUDES ABO/RH)
ABO/RH(D): O NEG
Antibody Screen: POSITIVE
Fetal Screen: NEGATIVE
Gestational Age(Wks): 31
Unit division: 0

## 2022-07-01 LAB — TYPE AND SCREEN
ABO/RH(D): O NEG
Antibody Screen: POSITIVE

## 2022-07-01 MED ORDER — OXYCODONE HCL 5 MG PO TABS: 5.0000 mg | ORAL_TABLET | ORAL | 0 refills | Status: DC | PRN

## 2022-07-01 MED ORDER — FERROUS SULFATE 325 (65 FE) MG PO TABS: 325.0000 mg | ORAL_TABLET | ORAL | 1 refills | Status: DC

## 2022-07-01 MED ORDER — BUPROPION HCL ER (XL) 150 MG PO TB24: 150.0000 mg | ORAL_TABLET | Freq: Every day | ORAL | 2 refills | Status: DC

## 2022-07-01 MED ORDER — BUPROPION HCL ER (XL) 150 MG PO TB24: 150.0000 mg | ORAL_TABLET | Freq: Every day | ORAL | Status: DC

## 2022-07-01 MED ORDER — IBUPROFEN 600 MG PO TABS: 600.0000 mg | ORAL_TABLET | Freq: Four times a day (QID) | ORAL | 0 refills | Status: DC

## 2022-07-01 NOTE — Lactation Note (Signed)
This note was copied from a baby's chart.  NICU Lactation Consultation Note  Patient Name: Kristen Padilla Date: 07/01/2022 Age:32 years  Subjective Reason for consult: Follow-up assessment; 1st time breastfeeding; NICU baby; Preterm <34wks; Infant < 6lbs; Maternal discharge; Other (Comment) (Hx of bariatric surgery)  Visited with family of 1 hours old pre-term NICU female; Kristen Padilla is a P2 but this is her first time breastfeeding; her oldest child is 18 years old. She reports that pumping is going well but her volumes remain inconsistent throughout  the day. She also voiced she's going home today. Explained expectations for the first days post-partum prior to the OOL. Reviewed lactogenesis II, pumping schedule, pump settings, discharge education, supplementation post-bariatric surgery and anticipatory guidelines.  Objective Infant data: Mother's Current Feeding Choice: Breast Milk and Donor Milk  Maternal data: [id number]  C-Section, Low Transverse Current breast feeding challenges:: NICU Previous breastfeeding challenges?: Other (Comment); Low milk supply; Lack of support (states that her milk did not "come in") Does the patient have breastfeeding experience prior to this delivery?: Yes How long did the patient breastfeed?: attempted for 3 days Pumping frequency: 6 times/24 hours Pumped volume: 10 mL (10-15 ml) Flange Size: 24 Risk factor for low milk supply:: Lap-band surgery Pump:  (Stork pump referral sent on 07/01/2022)  Assessment Infant: Feeding Status: NPO  Maternal: Milk volume: Normal  Intervention/Plan Interventions: Breast feeding basics reviewed; Education; DEBP Tools: Pump; Flanges Pump Education: Setup, frequency, and cleaning; Milk Storage  Plan of care: Encouraged pumping every 3 hours, ideally 8 pumping sessions/24 hours Verify stork pump issuance, to be delivered to baby's room in 304; mom plans on rooming in   FOB present. All  questions and concerns answered, family to contact [facility] services PRN.  Consult Status: NICU follow-up  NICU Follow-up type: Verify onset of copious milk; Verify absence of engorgement; Weekly NICU follow up   [employer] 07/01/2022, 1:29 PM

## 2022-07-01 NOTE — Plan of Care (Signed)
  Problem: Education: Goal: Knowledge of disease or condition will improve Outcome: Adequate for Discharge Goal: Knowledge of the prescribed therapeutic regimen will improve Outcome: Adequate for Discharge Goal: Individualized Educational Video(s) Outcome: Adequate for Discharge   Problem: Clinical Measurements: Goal: Complications related to the disease process, condition or treatment will be avoided or minimized Outcome: Adequate for Discharge   Problem: Education: Goal: Knowledge of General Education information will improve Description: Including pain rating scale, medication(s)/side effects and non-pharmacologic comfort measures Outcome: Adequate for Discharge   Problem: Health Behavior/Discharge Planning: Goal: Ability to manage health-related needs will improve Outcome: Adequate for Discharge   Problem: Clinical Measurements: Goal: Ability to maintain clinical measurements within normal limits will improve Outcome: Adequate for Discharge Goal: Will remain free from infection Outcome: Adequate for Discharge Goal: Diagnostic test results will improve Outcome: Adequate for Discharge Goal: Respiratory complications will improve Outcome: Adequate for Discharge Goal: Cardiovascular complication will be avoided Outcome: Adequate for Discharge   Problem: Activity: Goal: Risk for activity intolerance will decrease Outcome: Adequate for Discharge   Problem: Nutrition: Goal: Adequate nutrition will be maintained Outcome: Adequate for Discharge   Problem: Coping: Goal: Level of anxiety will decrease Outcome: Adequate for Discharge   Problem: Elimination: Goal: Will not experience complications related to bowel motility Outcome: Adequate for Discharge Goal: Will not experience complications related to urinary retention Outcome: Adequate for Discharge   Problem: Pain Managment: Goal: General experience of comfort will improve Outcome: Adequate for Discharge   Problem:  Safety: Goal: Ability to remain free from injury will improve Outcome: Adequate for Discharge   Problem: Skin Integrity: Goal: Risk for impaired skin integrity will decrease Outcome: Adequate for Discharge   Problem: Education: Goal: Knowledge of the prescribed therapeutic regimen will improve Outcome: Adequate for Discharge Goal: Understanding of sexual limitations or changes related to disease process or condition will improve Outcome: Adequate for Discharge Goal: Individualized Educational Video(s) Outcome: Adequate for Discharge   Problem: Self-Concept: Goal: Communication of feelings regarding changes in body function or appearance will improve Outcome: Adequate for Discharge   Problem: Skin Integrity: Goal: Demonstration of wound healing without infection will improve Outcome: Adequate for Discharge   Problem: Education: Goal: Knowledge of condition will improve Outcome: Adequate for Discharge Goal: Individualized Educational Video(s) Outcome: Adequate for Discharge Goal: Individualized Newborn Educational Video(s) Outcome: Adequate for Discharge   Problem: Activity: Goal: Will verbalize the importance of balancing activity with adequate rest periods Outcome: Adequate for Discharge Goal: Ability to tolerate increased activity will improve Outcome: Adequate for Discharge   Problem: Coping: Goal: Ability to identify and utilize available resources and services will improve Outcome: Adequate for Discharge   Problem: Life Cycle: Goal: Chance of risk for complications during the postpartum period will decrease Outcome: Adequate for Discharge   Problem: Role Relationship: Goal: Ability to demonstrate positive interaction with newborn will improve Outcome: Adequate for Discharge   Problem: Skin Integrity: Goal: Demonstration of wound healing without infection will improve Outcome: Adequate for Discharge

## 2022-07-01 NOTE — Discharge Summary (Signed)
Postpartum Discharge Summary   Patient Name: Kristen Padilla [facility] LEOS TORRES DOB: [birth year] MRN: [id number]  Date of admission: 06/27/2022 Delivery date:06/29/2022  Delivering provider: Waymon Amato , MD.  Date of discharge: 07/01/2022  Admitting diagnosis: Vaginal bleeding in pregnancy, third trimester [O46.93] Placental abruption in third trimester [O45.93] Intrauterine pregnancy: [redacted]w[redacted]d     Secondary diagnosis:  Principal Problem:   Vaginal bleeding in pregnancy, third trimester Active Problems:   Placental abruption in third trimester  Additional problems: Iron deficiency anemia    Discharge diagnosis: Preterm Pregnancy Delivered, GDM A1, and Anemia  Placenta abruption.                                             Post partum procedures: None Complications: Placental Abruption  Hospital course: Onset of Labor With Unplanned C/S   32 y.o. yo [id number] at [redacted]w[redacted]d was admitted with third trimester bleeding on 06/27/2022. Patient remained stable until 06/19/22 when she had a significant vaginal bleeding (presumed placenta abruption which was confirmed on examination of the placenta), abdominal pain and abnormal fetal heart tracing for which she underwent an emergency cesarean section.  Delivery details as follows: Membrane Rupture Time/Date: 3:13 PM ,06/29/2022   Delivery Method:C-Section, Low Transverse  Details of operation can be found in separate operative note. Patient had a postpartum course complicated by anemia.  She is ambulating,tolerating a regular diet, passing flatus, and urinating well.  Patient is discharged home in stable condition 07/01/22.  She desires anti-depressants start due to her history of postpartum depression in a prior pregnancy.   Newborn Data: Birth date:06/29/2022  Birth time:5:15 PM  Gender:Female  Living status:Living  Apgars:8 ,9  Weight:1910 g   Physical exam  Vitals:   07/01/22 0041 07/01/22 0404 07/01/22 0740 07/01/22 1208  BP: 105/63 (!) 90/52  114/68 (!) 101/55  Pulse: 77 77 85 70  Resp: 17 18 18 18   Temp: 97.7 F (36.5 C) 98 F (36.7 C) 98 F (36.7 C) 97.9 F (36.6 C)  TempSrc: Oral  Oral Oral  SpO2:   100% 100%  Weight:      Height:       General: alert, cooperative, and no distress Lochia: appropriate Uterine Fundus: firm Incision: Dressing is clean, dry, and intact DVT Evaluation: No evidence of DVT seen on physical exam. No significant calf/ankle edema. Labs: Lab Results  Component Value Date   WBC 17.8 (H) 06/30/2022   HGB 8.9 (L) 06/30/2022   HCT 25.4 (L) 06/30/2022   MCV 86.1 06/30/2022   PLT 284 06/30/2022      Latest Ref Rng & Units 06/23/2022    5:45 AM  CMP  Glucose mg/dL 79    Edinburgh Score:    06/30/2022   12:13 AM  Edinburgh Postnatal Depression Scale Screening Tool  I have been able to laugh and see the funny side of things. 0  I have looked forward with enjoyment to things. 0  I have blamed myself unnecessarily when things went wrong. 0  I have been anxious or worried for no good reason. 2  I have felt scared or panicky for no good reason. 1  Things have been getting on top of me. 1  I have been so unhappy that I have had difficulty sleeping. 0  I have felt sad or miserable. 0  I have been so unhappy that  I have been crying. 0  The thought of harming myself has occurred to me. 0  Edinburgh Postnatal Depression Scale Total 4     After visit meds:  Allergies as of 07/01/2022       Reactions   Progesterone Swelling   Vaginal swelling when taken vaginally   Aripiprazole Rash   Prenatal Vitamins Rash   States she got a rash from taking the prenatal the hospital offers    Sulfa Antibiotics Rash        Medication List     TAKE these medications    buPROPion 150 MG 24 hr tablet Commonly known as: WELLBUTRIN XL Take 1 tablet (150 mg total) by mouth daily. Start taking on: July 02, 2022   ferrous sulfate 325 (65 FE) MG tablet Take 1 tablet (325 mg total) by mouth every  other day. Start taking on: July 02, 2022   ibuprofen 600 MG tablet Commonly known as: ADVIL Take 1 tablet (600 mg total) by mouth every 6 (six) hours.   oxyCODONE 5 MG immediate release tablet Commonly known as: Oxy IR/ROXICODONE Take 1-2 tablets (5-10 mg total) by mouth every 4 (four) hours as needed for moderate pain.   Prenatal 28-0.8 MG Tabs Take 1 tablet by mouth daily.       Discharge home in stable condition Infant Feeding: Bottle and Breast Infant Disposition:NICU Discharge instruction: per After Visit Summary and Postpartum booklet. Activity: Advance as tolerated. Pelvic rest for 6 weeks.  Diet: routine diet Future Appointments: Future Appointments  Date Time Provider [place]  07/10/2022  8:15 AM [facility] NURSE [facility]  07/10/2022  8:30 AM [facility] US3 [facility]  07/18/2022  7:15 AM [facility] NURSE [facility]  07/18/2022  7:30 AM [facility] US3 [facility] [place]   Follow up Visit:  Follow-up Information     Ob/Gyn, [place]. Schedule an appointment as soon as possible for a visit in 1 week(s).   Specialty: Obstetrics and Gynecology Why: Mood check. Contact information: [place] [address] [phone]         Waymon Amato, MD. Schedule an appointment as soon as possible for a visit in 6 week(s).   Specialty: Obstetrics and Gynecology Why: Postpartum check. Contact information: [place] [address] [phone]                Anticipated Birth Control: Unsure, declined.     07/01/2022 Archie Endo, MD

## 2022-07-02 ENCOUNTER — Encounter (HOSPITAL_COMMUNITY): Payer: Self-pay | Admitting: Obstetrics & Gynecology

## 2022-07-02 ENCOUNTER — Ambulatory Visit: Payer: Self-pay

## 2022-07-02 LAB — GLUCOSE, CAPILLARY: Glucose-Capillary: 93 mg/dL (ref 70–99)

## 2022-07-02 NOTE — Lactation Note (Signed)
This note was copied from a baby's chart. Lactation Consultation Note  Patient Name: Kristen Padilla'G Date: 07/02/2022 Reason for consult: Follow-up assessment;1st time breastfeeding;NICU baby;Preterm <34wks Age:31 hours  Provided mother with Medela Pump/Style DEBP. Mother recently pumped 30 ml.  Milk is transitioning. She asked how to increase her milk supply. Until her milk fully transitions, suggest hand expressing before and after pumping.  Also recommend pumping a minimum of 8 times per day if able.  Suggest pumping q 2.5 hours during the day and q 4 hours at night. Reviewed milk storage.  No further questions at this time.  Feeding Mother's Current Feeding Choice: Breast Milk and Donor Milk   Lactation Tools Discussed/Used Tools: Pump Breast pump type: Double-Electric Breast Pump Pump Education: Milk Storage Pumped volume: 30 mL  Interventions Interventions: Education;DEBP  Consult Status Consult Status: NICU follow-up Date: 07/03/22 Follow-up type: In-patient    Vivianne Master [facility] 07/02/2022, 11:38 AM

## 2022-07-03 LAB — SURGICAL PATHOLOGY

## 2022-07-06 ENCOUNTER — Telehealth (HOSPITAL_COMMUNITY): Payer: Self-pay | Admitting: *Deleted

## 2022-07-06 NOTE — Telephone Encounter (Signed)
Patient voiced no questions or concerns regarding her health at this time. EPDS=8. Infant remains in NICU. Patient verbalized having lots of questions about breastfeeding process. Stated, "I was planning to bottle feed him, but they told me that breastfeeding is best. But I don't know anything about the process." RN encouraged patient to consider taking a breastfeeding class offered by the hospital. RN also provided  information on hospital's virtual postpartum classes and support groups. Email sent per patient request with hospital website, CDC recommendations for storing breastmilk, and with information on hospital postpartum classes and support groups. Erline Levine, RN, 07/06/22, [phone]

## 2022-07-06 NOTE — Telephone Encounter (Signed)
Attempted hospital discharge follow-up call. Left message for patient to return RN call with any questions or concerns. Erline Levine, RN, 07/06/22, [phone]

## 2022-07-10 ENCOUNTER — Ambulatory Visit: Payer: Medicaid Other

## 2022-07-10 ENCOUNTER — Other Ambulatory Visit: Payer: Medicaid Other

## 2022-07-10 ENCOUNTER — Ambulatory Visit: Payer: Self-pay

## 2022-07-10 NOTE — Lactation Note (Signed)
This note was copied from a baby's chart.  NICU Lactation Consultation Note  Patient Name: Kristen Padilla Date: 07/10/2022 Age:31 days  Subjective Reason for consult: Follow-up assessment; 1st time breastfeeding; NICU baby; Preterm <34wks; Infant < 6lbs; Other (Comment) (Hx of bariatric surgery)  Visited with family of 31 74/74 weeks old (adjusted) NICU female; Kristen Padilla reports she's pumping and her supply has exponentially increased. The milk bank has also asked her to only bring 2 bottles/day due to lack of space in our freezer. Discussed the difference between overstock and oversupply; although in this case is safe to say that her supply is abundant. She's happy with her supply this time around Vs. What it was with her last baby, but also wishes it wasn't this much. Her plan is do combo feeding; she's planning on taking baby to breast once he's ready but also wants to pump and bottle feed. Reviewed some strategies to start down-regulating; explained about the importance of not going >6 hours without pumping at night or at any given time. She had questions regarding baby's feeding, breastmilk storage guidelines and milestones. She said her due date kept changing and wonders if baby is closer to 33 weeks old adjusted because he's actively cueing. Provided 8 oz. bottles; mom was very appreciative. Reviewed pumping schedule, lactogenesis III, IDF 1/2 and anticipatory guidelines.   Objective Infant data: Mother's Current Feeding Choice: Breast Milk and Donor Milk  Infant feeding assessment Scale for Readiness: 3  Maternal data: [id number]  C-Section, Low Transverse Pumping frequency: 5 times/24 hours Pumped volume: 300 mL (up to 360 ml.) Flange Size: 24 Pump: Stork Pump (Medela Pump & Style)  Assessment Infant: Feeding Status: -- (Scheduled feedings)  Maternal: Milk volume: Abundant  Intervention/Plan Interventions: Breast feeding basics reviewed; DEBP; Education;  Infant Driven Feeding Algorithm education Tools: Pump; Flanges Pump Education: Setup, frequency, and cleaning; Milk Storage  Plan of care: Encouraged pumping every 4-5 hours, she'll try not going more than 6 hours without pumping at night Parents will start working on some pre-feeding activities such as STS care and paci dips She'll call for assistance to take baby to an empty breast once he's ready   FOB present and supportive. All questions and concerns answered, family to contact [facility] services PRN.  Consult Status: NICU follow-up  NICU Follow-up type: Weekly NICU follow up; Assist with IDF-1 (Mother to pre-pump before breastfeeding)   Kristen Padilla 07/10/2022, 11:15 AM

## 2022-07-10 NOTE — Lactation Note (Signed)
This note was copied from a baby's chart. Lactation Consultation Note  Patient Name: Kristen Padilla Date: 07/10/2022   Age:31 days  NICU RN Karle Starch called out for lactation because Kristen Padilla was trying to latch baby on earlier and baby would not latch. NS was discussed as a way to help latch baby on but due to baby's age, this LC recommended to wait until feeding cues are more consistent; parents are agreeable. All questions and concerns answered, continue current plan of care.  Lenard Kampf S Christian [employer] 07/10/2022, 3:17 PM

## 2022-07-18 ENCOUNTER — Ambulatory Visit: Payer: Medicaid Other

## 2022-07-21 ENCOUNTER — Ambulatory Visit: Payer: Self-pay

## 2022-07-21 NOTE — Lactation Note (Signed)
This note was copied from a baby's chart.  NICU Lactation Consultation Note  Patient Name: Boy NATONYA FINSTAD FWYOV'Z Date: 07/21/2022 Age:31 wk.o.  Subjective Reason for consult: Weekly NICU follow-up; NICU baby; Late-preterm 34-36.6wks; 1st time breastfeeding; Other (Comment); Infant < 6lbs; Mother's request; Exclusive pumping and bottle feeding (Hx of bariatric surgery)  Visited with family of 49 37/54 weeks old (adjusted) NICU female; Ms. Gilani is a P2 but this is her first time breastfeeding. She called out for lactation due to nipple pain; she reported that she continues pumping consistently and her volumes remained large but that every time she pumps, she's having nipple pain and considering quitting pumping and switching to formula. She wanted to see lactation to give pumping and bottle feeding one last change. Upon assessment, nipples are intact, no S/S of trauma. Noticed she's using flanges # 27 which may not be appropriate at this time; her tissue is not as swollen as last week and the areolar edema has improved. Resized her to # 24 flanges and asked her to continue using a lubricating agent prior pumping.  Objective Infant data: Mother's Current Feeding Choice: Breast Milk and Donor Milk  Infant feeding assessment Scale for Readiness: 2 Scale for Quality: 3  Maternal data: [id number]  C-Section, Low Transverse Current breast feeding challenges:: NICU admission Pumping frequency: 5 times/24 hours Pumped volume: 240 mL (240 to 300 ml; she pumped 20 oz in a single pumping session on the PM on 07/21/2022) Flange Size: 24; 27 (currently using # 27 flanges) Risk factor for low milk supply:: lap band surgery, prematurity, infant separation Pump: Stork Pump (Medela Pump & Style)  Assessment Infant: Feeding Status: -- (Scheduled feedings)  Maternal: Milk volume: Abundant Nipples are intact, no S/S of nipple trauma  Intervention/Plan Interventions: Breast feeding basics  reviewed; DEBP; Education Tools: Pump; Flanges; Other (comment) (nipple butter) Pump Education: Setup, frequency, and cleaning; Milk Storage  Plan of care: Encouraged to continue pumping every 4-5 hours, she'll try not going more than 6 hours without pumping at night  She'll try # 24 flanges and will report back to lactation  She'll ice her breast if they become painful or uncomfortable Parents will continue working on bottle feedings; if she continues pumping, her goal is to pump and bottle only   FOB present but asleep. All questions and concerns answered, family to contact [facility] services PRN.  Consult Status: NICU follow-up  NICU Follow-up type: Weekly NICU follow up   [employer] 07/21/2022, 12:05 PM

## 2022-07-28 ENCOUNTER — Ambulatory Visit: Payer: Self-pay

## 2022-07-28 NOTE — Lactation Note (Signed)
This note was copied from a baby's chart.  NICU Lactation Consultation Note  Patient Name: Kristen Padilla Date: 07/28/2022 Age:31 wk.o.  Subjective Reason for consult: Weekly NICU follow-up; NICU baby; Late-preterm 34-36.6wks; Other (Comment); Exclusive pumping and bottle feeding; 1st time breastfeeding (Hx of bariatric surgery)  Visited with family of 39 79/37 weeks old (adjusted) NICU female; Kristen Padilla is a P2 but this is her first time breastfeeding. She reported that the nipple pain has improved greatly since she switched to flanges # 24; her supply continues to increase but she has handled to pump every 6 hours and this schedule works for her. Her plan is to exclusively pump and bottle feed for baby "Kristen Padilla". Reviewed pumping schedule, goals and IDF readiness; parents understand baby will continue advancing PO feeds at his own pace as it is developmentally appropriate for this age.  Objective Infant data: Mother's Current Feeding Choice: Breast Milk  Infant feeding assessment Scale for Readiness: 1 Scale for Quality: 2  Maternal data: [id number]  C-Section, Low Transverse Pumping frequency: 4 times/24 hours Pumped volume: 480 mL (average, sometimes is more, sometimes is less) Flange Size: 24 Pump: Stork Pump (Medela Pump & Style)  Assessment Infant: Feeding Status: -- (Scheduled feedings)  Maternal: Milk volume: Abundant  Intervention/Plan Interventions: Breast feeding basics reviewed; DEBP; Education Tools: Pump; Flanges; Coconut oil Pump Education: Setup, frequency, and cleaning; Milk Storage  Plan of care: Encouraged to continue pumping every 6 hours, as this schedule works for her without compromising her supply She'll ice her breast if they become painful or uncomfortable Parents will continue advancing with PO feedings by bottle    FOB present and supportive. All questions and concerns answered, family to contact [facility] services PRN.  Consult  Status: NICU follow-up  NICU Follow-up type: Weekly NICU follow up   [place] 07/28/2022, 5:49 PM

## 2022-07-31 ENCOUNTER — Ambulatory Visit: Payer: Self-pay

## 2022-07-31 NOTE — Lactation Note (Signed)
This note was copied from a baby's chart.  NICU Lactation Consultation Note  Patient Name: Kristen Padilla Kristen Padilla YDXAJ'O Date: 07/31/2022 Age:31 wk.o.  Subjective Reason for consult: Follow-up assessment; NICU baby; Late-preterm 34-36.6wks; Other (Comment); Exclusive pumping and bottle feeding; 1st time breastfeeding (Hx of bariatric surgery)  Visited with family of 44 55/20 weeks old (adjusted) NICU female; Ms. Weilbacher is a P2 but this is her first time breastfeeding. She continues pumping consistently and has found that spacing her pumping sessions by 6 hours works best for her to keep her supply under control. Parents are taking baby Kristen Padilla home today. Reviewed discharge education, supply/demand and anticipatory guidelines. Ms. Scales politely declined an [place] OP referral since her plan is to continue pumping and bottle feeding baby Kristen Padilla.  FOB present and supportive, all questions and concerns answered, family to contact [facility] services PRN.  Objective Infant data: Mother's Current Feeding Choice: Breast Milk  Infant feeding assessment Scale for Readiness: 1 Scale for Quality: 2  Maternal data: [id number]  C-Section, Low Transverse Pumping frequency: 4 times/24 hours Pumped volume: 480 mL (average, it varies) Flange Size: 24 Pump: Stork Pump (Medela Pump & Style)  Assessment Infant: In NICU  Maternal: Milk volume: Abundant  Intervention/Plan Interventions: Breast feeding basics reviewed; DEBP; Education Tools: Pump; Flanges; Coconut oil Pump Education: Setup, frequency, and cleaning; Milk Storage  Plan: Consult Status: Complete   Kameisha Malicki S Kesley Mullens 07/31/2022, 4:13 PM

## 2023-01-11 ENCOUNTER — Other Ambulatory Visit: Payer: Self-pay

## 2023-01-11 ENCOUNTER — Emergency Department (HOSPITAL_COMMUNITY)
Admission: EM | Admit: 2023-01-11 | Discharge: 2023-01-11 | Discharge status: Against Medical Advice | Payer: Medicaid Other | Source: Home / Self Care

## 2023-01-11 ENCOUNTER — Ambulatory Visit (HOSPITAL_COMMUNITY)
Admission: EM | Admit: 2023-01-11 | Discharge: 2023-01-11 | Disposition: A | Discharge status: Home / Self Care | Payer: Medicaid Other | Source: Home / Self Care

## 2023-01-11 DIAGNOSIS — Z9151 Personal history of suicidal behavior: Secondary | ICD-10-CM | POA: Insufficient documentation

## 2023-01-11 DIAGNOSIS — R45851 Suicidal ideations: Secondary | ICD-10-CM | POA: Insufficient documentation

## 2023-01-11 DIAGNOSIS — F32A Depression, unspecified: Secondary | ICD-10-CM | POA: Insufficient documentation

## 2023-01-11 DIAGNOSIS — Z638 Other specified problems related to primary support group: Secondary | ICD-10-CM | POA: Insufficient documentation

## 2023-01-11 DIAGNOSIS — R4587 Impulsiveness: Secondary | ICD-10-CM | POA: Insufficient documentation

## 2023-01-11 DIAGNOSIS — Y900 Blood alcohol level of less than 20 mg/100 ml: Secondary | ICD-10-CM | POA: Insufficient documentation

## 2023-01-11 DIAGNOSIS — Z5321 Procedure and treatment not carried out due to patient leaving prior to being seen by health care provider: Secondary | ICD-10-CM | POA: Insufficient documentation

## 2023-01-11 LAB — CBC
HCT: 32.6 % — ABNORMAL LOW (ref 36.0–46.0)
Hemoglobin: 10 g/dL — ABNORMAL LOW (ref 12.0–15.0)
MCH: 23.5 pg — ABNORMAL LOW (ref 26.0–34.0)
MCHC: 30.7 g/dL (ref 30.0–36.0)
MCV: 76.5 fL — ABNORMAL LOW (ref 80.0–100.0)
Platelets: 406 10*3/uL — ABNORMAL HIGH (ref 150–400)
RBC: 4.26 MIL/uL (ref 3.87–5.11)
RDW: 15.5 % (ref 11.5–15.5)
WBC: 9.5 10*3/uL (ref 4.0–10.5)
nRBC: 0 % (ref 0.0–0.2)

## 2023-01-11 LAB — COMPREHENSIVE METABOLIC PANEL
ALT: 12 U/L (ref 0–44)
AST: 13 U/L — ABNORMAL LOW (ref 15–41)
Albumin: 3.4 g/dL — ABNORMAL LOW (ref 3.5–5.0)
Alkaline Phosphatase: 60 U/L (ref 38–126)
Anion gap: 8 (ref 5–15)
BUN: 8 mg/dL (ref 6–20)
CO2: 22 mmol/L (ref 22–32)
Calcium: 8.6 mg/dL — ABNORMAL LOW (ref 8.9–10.3)
Chloride: 107 mmol/L (ref 98–111)
Creatinine, Ser: 0.71 mg/dL (ref 0.44–1.00)
GFR, Estimated: >60 mL/min (ref >60)
Glucose, Bld: 87 mg/dL (ref 70–99)
Potassium: 3.6 mmol/L (ref 3.5–5.1)
Sodium: 137 mmol/L (ref 135–145)
Total Bilirubin: 0.5 mg/dL (ref 0.3–1.2)
Total Protein: 6.7 g/dL (ref 6.5–8.1)

## 2023-01-11 LAB — RAPID URINE DRUG SCREEN, HOSP PERFORMED
Amphetamines: NOT DETECTED
Barbiturates: NOT DETECTED
Benzodiazepines: NOT DETECTED
Cocaine: NOT DETECTED
Opiates: NOT DETECTED
Tetrahydrocannabinol: NOT DETECTED

## 2023-01-11 LAB — ACETAMINOPHEN LEVEL: Acetaminophen (Tylenol), Serum: <10 ug/mL — ABNORMAL LOW (ref 10–30)

## 2023-01-11 LAB — ETHANOL: Alcohol, Ethyl (B): <10 mg/dL (ref <10)

## 2023-01-11 LAB — HCG, SERUM, QUALITATIVE: Preg, Serum: NEGATIVE

## 2023-01-11 LAB — SALICYLATE LEVEL: Salicylate Lvl: <7 mg/dL — ABNORMAL LOW (ref 7.0–30.0)

## 2023-01-11 NOTE — ED Triage Notes (Signed)
Patient c/o feeling depressed denies SI  or HI however  she did  have an SI attempt several years ago, with taking pills. States she has a 29 month old infant unsure if she is having PP depression. States she was on medication for depression however never finished RX

## 2023-01-11 NOTE — ED Notes (Signed)
Pt called for vitals and room multiple times, no response 

## 2023-01-11 NOTE — ED Provider Notes (Signed)
Behavioral Health Urgent Care Medical Screening Exam  Patient Name: Kristen Padilla MRN: [id number] Date of Evaluation: 01/11/23 Chief Complaint: "I don't feel right and I get angry" Diagnosis:  Final diagnoses:  Family discord    History of Present illness: Kristen Padilla is a 32 y.o. female. Patient arrived with her father accompanying her.  She separated from her partner of 9 years approximately 2 months ago.  She also has a 30 month old who is currently with the child's father. Patient went to separated partners house in the middle of the night and opened a window.  Patient's father says she tried to climb through the window and that she yelled through the window.  Patient denies yelling or trying to climb through the window.  Patient and father are in agreement that the patient is impulsive and will act on feelings of anger without warning.  Patient states she doesn't understand what is going or why she is feeling this way.    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  She endorses a history of a previous suicide attempt.  She denies previous psychiatric care.    Flowsheet Row ED from 01/11/2023 in [facility] Most recent reading at 01/11/2023  7:05 AM ED from 01/11/2023 in [facility] Emergency Department at [facility] Most recent reading at 01/11/2023  4:42 AM Admission (Discharged) from 06/27/2022 in [place] [facility] Most recent reading at 06/29/2022  7:42 PM  C-SSRS RISK CATEGORY Low Risk No Risk No Risk       Psychiatric Specialty Exam  Presentation  General Appearance:Appropriate for Environment  Eye Contact:Good  Speech:Clear and Coherent  Speech Volume:Normal  Handedness:Right   Mood and Affect  Mood: Euthymic  Affect: Congruent   Thought Process  Thought Processes: Coherent  Descriptions of Associations:Intact  Orientation:Full  (Time, Place and Person)  Thought Content:Rumination  Diagnosis of Schizophrenia or Schizoaffective disorder in past: No data recorded  Hallucinations:None  Ideas of Reference:None  Suicidal Thoughts:No  Homicidal Thoughts:No   Sensorium  Memory: Immediate Good; Recent Good; Remote Good  Judgment: Impaired  Insight: Fair   Art therapist  Concentration: Good  Attention Span: Good  Recall: Good  Fund of Knowledge: Good  Language: Good   Psychomotor Activity  Psychomotor Activity: Normal   Assets  Assets: Communication Skills; Social Support; Housing   Sleep  Sleep: Fair  Number of hours: No data recorded  Physical Exam: Physical Exam Eyes:     Pupils: Pupils are equal, round, and reactive to light.  Pulmonary:     Effort: Pulmonary effort is normal.  Neurological:     Mental Status: She is alert and oriented to person, place, and time.    Review of Systems  Eyes:        Patient has what appears like bruising around her right eye  Psychiatric/Behavioral:         Impulsive behaviors, intermittent anger and a feeling of disconnection  All other systems reviewed and are negative.  Blood pressure 117/72, pulse 70, temperature 98 F (36.7 C), temperature source Oral, resp. rate 18, SpO2 100%, unknown if currently breastfeeding. There is no height or weight on file to calculate BMI.  Musculoskeletal: Strength & Muscle Tone: within normal limits Gait & Station: normal Patient leans: N/A   [facility] MSE Discharge Disposition for Follow up and Recommendations: Based on my evaluation the patient does not  appear to have an emergency medical condition and can be discharged with resources and follow up care in outpatient services for Medication Management and Individual Therapy   Thomes Lolling, NP 01/11/2023, 8:10 AM

## 2023-01-11 NOTE — Discharge Instructions (Addendum)
Recommend seeking additional outpatient services for therapy and psychiatry.   Take all of you medications as prescribed by your mental healthcare provider.  Report any adverse effects and reactions from your medications to your outpatient provider promptly.  Do not engage in alcohol and or illegal drug use while on prescription medicines. Keep all scheduled appointments. This is to ensure that you are getting refills on time and to avoid any interruption in your medication.  If you are unable to keep an appointment call to reschedule.  Be sure to follow up with resources and follow ups given. In the event of worsening symptoms call the crisis hotline, 911, and or go to the nearest emergency department for appropriate evaluation and treatment of symptoms. Follow-up with your primary care provider for your medical issues, concerns and or health care needs.

## 2023-01-11 NOTE — Progress Notes (Signed)
   01/11/23 0706  [facility] Triage Screening (Walk-ins at [facility] only)  How Did You Hear About [place]? Family/Friend  What Is the Reason for Your Visit/Call Today? Pt presents to [facility] voluntarily. Pt reports wanting help with mental health.Pt reports impulsive decisions lack of anger management and possible psotpartum depression. Pt denies SI/H at this time. Pt denies AVH. Pt reports no alcohol or drug use within 24 hours. Pt reports wanting an assessment and possible medication and therapy. Pt is routine  How Long Has This Been Causing You Problems? 1-6 months  Have You Recently Had Any Thoughts About Hurting Yourself? No  Are You Planning to Commit Suicide/Harm Yourself At This time? No  Have you Recently Had Thoughts About Hurting Someone Karolee Ohs? No  Are You Planning To Harm Someone At This Time? No  Are you currently experiencing any auditory, visual or other hallucinations? No  Have You Used Any Alcohol or Drugs in the Past 24 Hours? No  Do you have any current medical co-morbidities that require immediate attention? No  Clinician description of patient physical appearance/behavior: Pt is fairly groomed and somewhat cooperative. Pt reports often not knowing exactly what she would like help with specifically.  What Do You Feel Would Help You the Most Today? Treatment for Depression or other mood problem  If access to [facility] Urgent Care was not available, would you have sought care in the Emergency Department? Yes  Determination of Need Routine (7 days)  Options For Referral Therapeutic Triage Services;Medication Management;Outpatient Therapy

## 2023-01-11 NOTE — ED Triage Notes (Signed)
Pt presents to [facility] voluntarily. Pt reports wanting help with mental health.Pt reports impulsive decisions lack of anger management and possible postpartum depression. Pt denies SI/HI at this time. Pt denies AVH. Pt reports no alcohol or drug use within 24 hours. Pt reports wanting an assessment and possible medication and therapy. Pt is routine

## 2023-01-11 NOTE — Progress Notes (Signed)
   01/11/23 0706  [facility] Triage Screening (Walk-ins at [facility] only)  How Did You Hear About [place]? Family/Friend  What Is the Reason for Your Visit/Call Today? Pt presents to [facility] voluntarily. Pt reports wanting help with mental health.Pt reports impulsive decisions lack of anger management and possible postpartum depression. Pt denies SI/HI at this time. Pt denies AVH. Pt reports no alcohol or drug use within 24 hours. Pt reports wanting an assessment and possible medication and therapy. Pt is routine  How Long Has This Been Causing You Problems? 1-6 months  Have You Recently Had Any Thoughts About Hurting Yourself? No  Are You Planning to Commit Suicide/Harm Yourself At This time? No  Have you Recently Had Thoughts About Hurting Someone Karolee Ohs? No  Are You Planning To Harm Someone At This Time? No  Are you currently experiencing any auditory, visual or other hallucinations? No  Have You Used Any Alcohol or Drugs in the Past 24 Hours? No  Do you have any current medical co-morbidities that require immediate attention? No  Clinician description of patient physical appearance/behavior: Pt is fairly groomed and somewhat cooperative. Pt reports often not knowing exactly what she would like help with specifically.  What Do You Feel Would Help You the Most Today? Treatment for Depression or other mood problem  If access to [facility] Urgent Care was not available, would you have sought care in the Emergency Department? Yes  Determination of Need Routine (7 days)  Options For Referral Therapeutic Triage Services;Medication Management;Outpatient Therapy

## 2023-01-11 NOTE — ED Notes (Signed)
Patient discharged with written and verbal instructions by provider Hillery Jacks, NP. Resources provided.

## 2023-01-12 ENCOUNTER — Inpatient Hospital Stay (HOSPITAL_COMMUNITY)
Admission: EM | Admit: 2023-01-12 | Discharge: 2023-01-14 | DRG: 918 | Disposition: A | Discharge status: Other Acute Inpatient Hospital | Payer: Medicaid Other | Attending: Internal Medicine | Admitting: Internal Medicine

## 2023-01-12 ENCOUNTER — Encounter (HOSPITAL_COMMUNITY): Payer: Self-pay

## 2023-01-12 DIAGNOSIS — Z9151 Personal history of suicidal behavior: Secondary | ICD-10-CM

## 2023-01-12 DIAGNOSIS — T50902A Poisoning by unspecified drugs, medicaments and biological substances, intentional self-harm, initial encounter: Secondary | ICD-10-CM | POA: Diagnosis present

## 2023-01-12 DIAGNOSIS — Y929 Unspecified place or not applicable: Secondary | ICD-10-CM

## 2023-01-12 DIAGNOSIS — Z91410 Personal history of adult physical and sexual abuse: Secondary | ICD-10-CM | POA: Diagnosis not present

## 2023-01-12 DIAGNOSIS — Z818 Family history of other mental and behavioral disorders: Secondary | ICD-10-CM

## 2023-01-12 DIAGNOSIS — F4310 Post-traumatic stress disorder, unspecified: Secondary | ICD-10-CM | POA: Diagnosis present

## 2023-01-12 DIAGNOSIS — Z833 Family history of diabetes mellitus: Secondary | ICD-10-CM | POA: Diagnosis not present

## 2023-01-12 DIAGNOSIS — T43292A Poisoning by other antidepressants, intentional self-harm, initial encounter: Secondary | ICD-10-CM | POA: Diagnosis present

## 2023-01-12 DIAGNOSIS — Z888 Allergy status to other drugs, medicaments and biological substances status: Secondary | ICD-10-CM

## 2023-01-12 DIAGNOSIS — Z882 Allergy status to sulfonamides status: Secondary | ICD-10-CM | POA: Diagnosis not present

## 2023-01-12 DIAGNOSIS — Z9884 Bariatric surgery status: Secondary | ICD-10-CM | POA: Diagnosis not present

## 2023-01-12 DIAGNOSIS — Z638 Other specified problems related to primary support group: Secondary | ICD-10-CM | POA: Diagnosis not present

## 2023-01-12 DIAGNOSIS — D509 Iron deficiency anemia, unspecified: Secondary | ICD-10-CM | POA: Diagnosis present

## 2023-01-12 DIAGNOSIS — Z8249 Family history of ischemic heart disease and other diseases of the circulatory system: Secondary | ICD-10-CM

## 2023-01-12 DIAGNOSIS — F411 Generalized anxiety disorder: Secondary | ICD-10-CM | POA: Diagnosis present

## 2023-01-12 DIAGNOSIS — Z91148 Patient's other noncompliance with medication regimen for other reason: Secondary | ICD-10-CM

## 2023-01-12 DIAGNOSIS — Z9152 Personal history of nonsuicidal self-harm: Secondary | ICD-10-CM | POA: Diagnosis not present

## 2023-01-12 DIAGNOSIS — D649 Anemia, unspecified: Secondary | ICD-10-CM

## 2023-01-12 DIAGNOSIS — F329 Major depressive disorder, single episode, unspecified: Secondary | ICD-10-CM | POA: Diagnosis present

## 2023-01-12 DIAGNOSIS — F32A Depression, unspecified: Secondary | ICD-10-CM | POA: Diagnosis present

## 2023-01-12 DIAGNOSIS — T50902D Poisoning by unspecified drugs, medicaments and biological substances, intentional self-harm, subsequent encounter: Secondary | ICD-10-CM | POA: Diagnosis not present

## 2023-01-12 DIAGNOSIS — Z7289 Other problems related to lifestyle: Secondary | ICD-10-CM | POA: Diagnosis not present

## 2023-01-12 DIAGNOSIS — T43291A Poisoning by other antidepressants, accidental (unintentional), initial encounter: Secondary | ICD-10-CM

## 2023-01-12 HISTORY — DX: Poisoning by other antidepressants, accidental (unintentional), initial encounter: T43.291A

## 2023-01-12 HISTORY — DX: Generalized anxiety disorder: F41.1

## 2023-01-12 LAB — COMPREHENSIVE METABOLIC PANEL
ALT: 13 U/L (ref 0–44)
AST: 14 U/L — ABNORMAL LOW (ref 15–41)
Albumin: 3.6 g/dL (ref 3.5–5.0)
Alkaline Phosphatase: 64 U/L (ref 38–126)
Anion gap: 10 (ref 5–15)
BUN: 6 mg/dL (ref 6–20)
CO2: 22 mmol/L (ref 22–32)
Calcium: 8.8 mg/dL — ABNORMAL LOW (ref 8.9–10.3)
Chloride: 106 mmol/L (ref 98–111)
Creatinine, Ser: 0.85 mg/dL (ref 0.44–1.00)
GFR, Estimated: >60 mL/min (ref >60)
Glucose, Bld: 81 mg/dL (ref 70–99)
Potassium: 3.3 mmol/L — ABNORMAL LOW (ref 3.5–5.1)
Sodium: 138 mmol/L (ref 135–145)
Total Bilirubin: 0.6 mg/dL (ref 0.3–1.2)
Total Protein: 7.2 g/dL (ref 6.5–8.1)

## 2023-01-12 LAB — RAPID URINE DRUG SCREEN, HOSP PERFORMED
Amphetamines: NOT DETECTED
Barbiturates: NOT DETECTED
Benzodiazepines: NOT DETECTED
Cocaine: NOT DETECTED
Opiates: NOT DETECTED
Tetrahydrocannabinol: NOT DETECTED

## 2023-01-12 LAB — MAGNESIUM: Magnesium: 2 mg/dL (ref 1.7–2.4)

## 2023-01-12 LAB — ACETAMINOPHEN LEVEL: Acetaminophen (Tylenol), Serum: <10 ug/mL — ABNORMAL LOW (ref 10–30)

## 2023-01-12 LAB — CBC WITH DIFFERENTIAL/PLATELET
Abs Immature Granulocytes: 0.03 10*3/uL (ref 0.00–0.07)
Basophils Absolute: 0.1 10*3/uL (ref 0.0–0.1)
Basophils Relative: 1 %
Eosinophils Absolute: 0.1 10*3/uL (ref 0.0–0.5)
Eosinophils Relative: 1 %
HCT: 35.1 % — ABNORMAL LOW (ref 36.0–46.0)
Hemoglobin: 10.8 g/dL — ABNORMAL LOW (ref 12.0–15.0)
Immature Granulocytes: 0 %
Lymphocytes Relative: 21 %
Lymphs Abs: 2 10*3/uL (ref 0.7–4.0)
MCH: 23.9 pg — ABNORMAL LOW (ref 26.0–34.0)
MCHC: 30.8 g/dL (ref 30.0–36.0)
MCV: 77.8 fL — ABNORMAL LOW (ref 80.0–100.0)
Monocytes Absolute: 0.6 10*3/uL (ref 0.1–1.0)
Monocytes Relative: 6 %
Neutro Abs: 6.5 10*3/uL (ref 1.7–7.7)
Neutrophils Relative %: 71 %
Platelets: 429 10*3/uL — ABNORMAL HIGH (ref 150–400)
RBC: 4.51 MIL/uL (ref 3.87–5.11)
RDW: 15.5 % (ref 11.5–15.5)
WBC: 9.2 10*3/uL (ref 4.0–10.5)
nRBC: 0 % (ref 0.0–0.2)

## 2023-01-12 LAB — ETHANOL: Alcohol, Ethyl (B): <10 mg/dL (ref <10)

## 2023-01-12 LAB — SALICYLATE LEVEL: Salicylate Lvl: <7 mg/dL — ABNORMAL LOW (ref 7.0–30.0)

## 2023-01-12 LAB — HCG, SERUM, QUALITATIVE: Preg, Serum: NEGATIVE

## 2023-01-12 LAB — CBG MONITORING, ED: Glucose-Capillary: 84 mg/dL (ref 70–99)

## 2023-01-12 MED ORDER — RIVAROXABAN 10 MG PO TABS
10.0000 mg | ORAL_TABLET | Freq: Every day | ORAL | Status: DC
  Administered 2023-01-13 – 2023-01-14 (×2): 10 mg via ORAL
  Filled 2023-01-12 (×2): qty 1

## 2023-01-12 MED ORDER — POTASSIUM CHLORIDE 10 MEQ/100ML IV SOLN
10.0000 meq | INTRAVENOUS | Status: AC
  Administered 2023-01-12 – 2023-01-13 (×6): 10 meq via INTRAVENOUS
  Filled 2023-01-12 (×6): qty 100

## 2023-01-12 MED ORDER — CHARCOAL ACTIVATED PO LIQD
85.0000 g | Freq: Once | ORAL | Status: AC
  Administered 2023-01-12: 85 g via ORAL
  Filled 2023-01-12: qty 480

## 2023-01-12 NOTE — ED Notes (Addendum)
Seizure precautions placed on bed for patient, patient on cardiac monitor. Vitals stable. Call bell in reach. Bed locked & low.

## 2023-01-12 NOTE — ED Triage Notes (Signed)
Seen here yesterday for same complaints. Reports worsening depression. Took 25 bupropion pills 150mg  at 1600 today. Alert and oriented x 4. Pt denies SI at this time. Reports family problems and states, "I just have problems with my husband and I don't even know why I took the pills." Hx of drug OD on 2021.

## 2023-01-12 NOTE — ED Provider Notes (Signed)
[place] EMERGENCY DEPARTMENT AT [facility] Provider Note   CSN: [id number] Arrival date & time: 01/12/23  1844     History Chief Complaint  Patient presents with  . Drug Overdose    HPI Kristen Padilla is a 32 y.o. female presenting for intentional overdose. Per bedside nurse, patient did not endorse suicidal intent.  She has been seen recently for psychiatric concerns.  States that she took 25 tablets of 150 milligram bupropion after a fight with her family.  Significant psychosocial concerns.  Husband left her and is recently reaching back out. She is denying any symptoms at this time.  Taken approximately 2 hours prior to arrival.    Patient's recorded medical, surgical, social, medication list and allergies were reviewed in the Snapshot window as part of the initial history.   Review of Systems   Review of Systems  Constitutional:  Negative for chills and fever.  HENT:  Negative for ear pain and sore throat.   Eyes:  Negative for pain and visual disturbance.  Respiratory:  Negative for cough and shortness of breath.   Cardiovascular:  Negative for chest pain and palpitations.  Gastrointestinal:  Negative for abdominal pain and vomiting.  Genitourinary:  Negative for dysuria and hematuria.  Musculoskeletal:  Negative for arthralgias and back pain.  Skin:  Negative for color change and rash.  Neurological:  Negative for seizures and syncope.  All other systems reviewed and are negative.   Physical Exam Updated Vital Signs BP 112/84   Pulse (!) 108   Temp 99.3 F (37.4 C) (Oral)   Resp 14   Ht 5\' 2"  (1.575 m)   Wt 83.9 kg   SpO2 100%   BMI 33.84 kg/m  Physical Exam Vitals and nursing note reviewed.  Constitutional:      General: She is not in acute distress.    Appearance: She is well-developed.  HENT:     Head: Normocephalic and atraumatic.  Eyes:     Conjunctiva/sclera: Conjunctivae normal.  Cardiovascular:     Rate and  Rhythm: Normal rate and regular rhythm.     Heart sounds: No murmur heard. Pulmonary:     Effort: Pulmonary effort is normal. No respiratory distress.     Breath sounds: Normal breath sounds.  Abdominal:     General: There is no distension.     Palpations: Abdomen is soft.     Tenderness: There is no abdominal tenderness. There is no right CVA tenderness or left CVA tenderness.  Musculoskeletal:        General: No swelling or tenderness. Normal range of motion.     Cervical back: Neck supple.  Skin:    General: Skin is warm and dry.  Neurological:     General: No focal deficit present.     Mental Status: She is alert and oriented to person, place, and time. Mental status is at baseline.     Cranial Nerves: No cranial nerve deficit.      ED Course/ Medical Decision Making/ A&P Clinical Course as of 01/12/23 2337  Wynelle Link Jan 12, 2023  2337 Admitted to medicine. (Internal medicine) [CC]    Clinical Course User Index [CC] Glyn Ade, MD    Procedures .Critical Care  Performed by: Glyn Ade, MD Authorized by: Glyn Ade, MD   Critical care provider statement:    Critical care time (minutes):  30   Critical care was necessary to treat or prevent imminent or life-threatening deterioration of the following  conditions:  Toxidrome   Critical care was time spent personally by me on the following activities:  Development of treatment plan with patient or surrogate, discussions with consultants, evaluation of patient's response to treatment, examination of patient, ordering and review of laboratory studies, ordering and review of radiographic studies, ordering and performing treatments and interventions, pulse oximetry, re-evaluation of patient's condition and review of old charts    Medications Ordered in ED Medications  potassium chloride 10 mEq in 100 mL IVPB (10 mEq Intravenous New Bag/Given 01/12/23 2229)  charcoal activated (NO SORBITOL) (ACTIDOSE-AQUA)  suspension 85 g (85 g Oral Given 01/12/23 1945)    Medical Decision Making:    Kristen Padilla is a 32 y.o. female who presented to the ED today with intentional overdose detailed above.     Patient's presentation is complicated by their history of psychiatric concerns.  Patient placed on continuous vitals and telemetry monitoring while in ED which was reviewed periodically.   Complete initial physical exam performed, notably the patient  was hemodynamically stable no acute distress.      Reviewed and confirmed nursing documentation for past medical history, family history, social history.    Initial Assessment:   This a critical overdose of a life-threatening medication.  Poison control was immediately consulted.  They recommended activated charcoal, 24-hour observations on telemetry, every 8 hour EKG, 4-hour Tylenol and a salicylate level, electrolyte trend overnight with goal to keep potassium and magnesium towards the upper limit of normal.  IInitial Study Results:   Laboratory  All laboratory results reviewed without evidence of clinically relevant pathology.   EKG EKG was reviewed independently. Rate, rhythm, axis, intervals all examined and without medically relevant abnormality. ST segments without concerns for elevations.   Consults:  Case discussed with [place] poison control.   Reassessment and Plan:   Reassessed patient after 4 hours in emergency room.  She remains hemodynamically stable no acute distress.  She was able to take the activated charcoal and lab work reveals no acute pathology.  Potassium being replaced up to therapeutic level.  Given recommendations for 24 hours of serial EKGs every 8 hours, consulted hospital admission team who agreed with need for admission.  Patient admitted with no further acute events.   Disposition:   Based on the above findings, I believe this patient is stable for admission.    Patient/family educated about specific  findings on our evaluation and explained exact reasons for admission.  Patient/family educated about clinical situation and time was allowed to answer questions.   Admission team communicated with and agreed with need for admission. Patient admitted. Patient ready to move at this time.     Emergency Department Medication Summary:   Medications  potassium chloride 10 mEq in 100 mL IVPB (10 mEq Intravenous New Bag/Given 01/12/23 2229)  charcoal activated (NO SORBITOL) (ACTIDOSE-AQUA) suspension 85 g (85 g Oral Given 01/12/23 1945)         Clinical Impression:  1. Intentional overdose, initial encounter [facility])      Admit   Final Clinical Impression(s) / ED Diagnoses Final diagnoses:  Intentional overdose, initial encounter [facility])    Rx / DC Orders ED Discharge Orders     None         Glyn Ade, MD 01/12/23 2310

## 2023-01-12 NOTE — ED Notes (Signed)
Mother Dennie Maizes [phone] would like an update asap

## 2023-01-12 NOTE — ED Notes (Signed)
IVC Paperwork completed. 

## 2023-01-12 NOTE — ED Notes (Signed)
Poison Control contacted Per Barbette Or the recommendations are:  Activated Charcoal 1g/kg 4 hour Salicylates, acetaminophen and CMP EKG Q4Hr x 3 24 Hr Observation Replace K+ to >4 and Mag to >2 Risk for seizures and GI upset Keep on Cardiac Monitor  Dr. Doran Durand made aware of recommendations

## 2023-01-12 NOTE — H&P (Incomplete)
Date: 01/12/2023               Patient Name:  Kristen Padilla MRN: [id number]  DOB: 10/29/1990 Age / Sex: 32 y.o., female   PCP: Pcp, No         Medical Service: Internal Medicine Teaching Service         Attending Physician: Dr. Reymundo Poll, MD      First Contact: {InternPager24/25:[id number]}    Second Contact: {ResidentPager24/25:[id number]}         After Hours (After 5p/  First Contact Pager: [phone]  weekends / holidays): Second Contact Pager: [phone]   SUBJECTIVE   Chief Complaint: Suicide attempt  History of Present Illness:   Kristen Padilla is a 32 y.o. with a prior history of multiple suicide attempts (2014 and 2021),depression, and GAD who presents today after having taken 25-30 tablets of 150mg  of Wellbutrin at around 4pm. She did this after an argument with her previous boyfriend of about 9 years who she has a 72mo old with. The ex boyfriend has a new girlfriend and the girlfriend was talking about him now being a family with her and at one point said "you should kill yourself". Ms Padilla states that she did this out of impulse after she was told to do it but also to get the attention of her exboyfriend, who is drifting away from her because "nobody wants him to be with me". She states that she used to go to a group counseling which helped her after her previous attempts, but she stopped going once she was done with therapy. These were very helpful for her, and would like to go back. Denies any symptoms currently. Denies taking any other medication.   ED Course: Activated charcoal    Meds:  No outpatient medications have been marked as taking for the 01/12/23 encounter [facility] Encounter).    Past Medical History  Past Surgical History:  Procedure Laterality Date  . CESAREAN SECTION N/A 06/29/2022   Procedure: CESAREAN SECTION;  Surgeon: Hoover Browns, MD;  Location: [facility] LD ORS;  Service: Obstetrics;  Laterality: N/A;  . HERNIA REPAIR     . LAPAROSCOPIC GASTRIC SLEEVE RESECTION    . Repair of displaced fracture of the left radius  03/2020    Social:  Lives With: Occupation: Support: Level of Function: PCP: Substances:  Family History: ***  Allergies: Allergies as of 01/12/2023 - Review Complete 01/12/2023  Allergen Reaction Noted  . Progesterone Swelling 06/17/2022  . Aripiprazole Rash 08/07/2011  . Prenatal vitamins Rash 06/29/2022  . Sulfa antibiotics Rash 08/07/2011    Review of Systems: A complete ROS was negative except as per HPI.   OBJECTIVE:   Physical Exam: Blood pressure 103/66, pulse 97, temperature 98.9 F (37.2 C), temperature source Oral, resp. rate 18, height 5\' 2"  (1.575 m), weight 83.9 kg, SpO2 100%, unknown if currently breastfeeding.  Constitutional: well-appearing *** sitting in ***, in no acute distress HENT: normocephalic atraumatic, mucous membranes moist Eyes: conjunctiva non-erythematous Neck: supple Cardiovascular: regular rate and rhythm, no m/r/g Pulmonary/Chest: normal work of breathing on room air, lungs clear to auscultation bilaterally Abdominal: soft, non-tender, non-distended MSK: normal bulk and tone Neurological: alert & oriented x 3, 5/5 strength in bilateral upper and lower extremities, normal gait Skin: warm and dry Psych: ***  Labs: CBC    Component Value Date/Time   WBC 9.2 01/12/2023 1852   RBC 4.51 01/12/2023 1852   HGB 10.8 (L) 01/12/2023 1852   HCT  35.1 (L) 01/12/2023 1852   PLT 429 (H) 01/12/2023 1852   MCV 77.8 (L) 01/12/2023 1852   MCH 23.9 (L) 01/12/2023 1852   MCHC 30.8 01/12/2023 1852   RDW 15.5 01/12/2023 1852   LYMPHSABS 2.0 01/12/2023 1852   MONOABS 0.6 01/12/2023 1852   EOSABS 0.1 01/12/2023 1852   BASOSABS 0.1 01/12/2023 1852     CMP     Component Value Date/Time   NA 138 01/12/2023 1852   K 3.3 (L) 01/12/2023 1852   CL 106 01/12/2023 1852   CO2 22 01/12/2023 1852   GLUCOSE 81 01/12/2023 1852   GLUCOSE 79 06/23/2022 0545    BUN 6 01/12/2023 1852   CREATININE 0.85 01/12/2023 1852   CALCIUM 8.8 (L) 01/12/2023 1852   PROT 7.2 01/12/2023 1852   ALBUMIN 3.6 01/12/2023 1852   AST 14 (L) 01/12/2023 1852   ALT 13 01/12/2023 1852   ALKPHOS 64 01/12/2023 1852   BILITOT 0.6 01/12/2023 1852   GFRNONAA >60 01/12/2023 1852   GFRAA >60 11/03/2016 2112    Imaging:  EKG: personally reviewed my interpretation is***. Prior EKG***  ASSESSMENT & PLAN:   Assessment & Plan by Problem: Principal Problem:   Overdose, intentional self-harm, initial encounter (HCC)   Kristen Padilla is a 32 y.o. person living with a history of *** who presented with *** and admitted for *** on hospital day 0  *** ***  *** ***  *** ***  Diet: {NAMES:3044014::"Normal","Heart Healthy","Carb-Modified","Renal","Carb/Renal","NPO","TPN","Tube Feeds"} VTE: {NAMES:3044014::"Heparin","Enoxaparin","SCDs","DOAC","None"} IVF: {NAMES:3044014::"None","NS","1/2 NS","LR","D5","D10"},{NAMES:3044014::"None","10cc/hr","25cc/hr","50cc/hr","75cc/hr","100cc/hr","110cc/hr","125cc/hr","Bolus"} Code: {NAMES:3044014::"Full","DNR","DNI","DNR/DNI","Comfort Care","Unknown"}  Prior to Admission Living Arrangement: {NAMES:3044014::"Home, living ***","SNF, ***","Homeless","***"} Anticipated Discharge Location: {NAMES:3044014::"Home","SNF","CIR","***"} Barriers to Discharge: ***  Dispo: Admit patient to {STATUS:3044014::"Observation with expected length of stay less than 2 midnights.","Inpatient with expected length of stay greater than 2 midnights."}  Signed: [facility]  Internal Medicine Resident, PGY-1 [facility]  Pager: [phone]  01/12/2023, 11:51 PM

## 2023-01-12 NOTE — Hospital Course (Addendum)
OD on wellbutrin Intense psychosocial hx 25 wellbutrin 150 mg, around 4p Husband left fam then came back today??  Says she's good right now, feels a lot better.  A lot of it is she and her husband separated. His gf was talking crap over the phone She couldn't handle it "I think I need therapy" Took pills because "I don't know, at that time I don't know why I took them but I'm not going to do it again" Thinks it was arguing with husband's SO. She said "this is my family now." They were together almost 10 years, have a 27 month old together The partner said why don't you go kill yourself and when she said that she snapped and then took the pills Keeps saying she's fine, is completely fine Took them around 3-4pm. Took wellbutrin, doesn't know how many she took Intention today was for attention from her husband ? No one wants them together, he tells her one thing then does another thing. "They won't let me talk to him." Thinks she needs a therapist but doesn't have one now. Denies symptoms when she took them or now No palpitations, shob,  Has tried to overdose before. Before she says it was suicidal, during deep depressive episode in 2021 and she stayed in the hospital for 1 week Has been having some depression now. Finds joy in things.  Tele group therapy after last hospitalization which did help but stopped ebcause she finished the course. Wants to do something like that again she says. No nausea vomiting blurry vision abd pain weakness diarrhea constipation palpitations edema urinary sx Says she feels safe  Poison control said q8h EKG for 24h If normal at 24h medically clear Got activated charcoal K ULN >4-4.5 Mg >2 Bmp and mag q6h Consult psych tele  Came in police so should be IVCed, EDP to confirm  HPI   PMH MDD  PSH Cesarean section 2023 Hernia repair Laparoscopic gastric sleeve resection Repair of displaced L radius fracture  MEDS Bupropion 150 mg daily (was not  taking) Ferrous sulfate 325 mg daily no Ibuprofen 600 mg daily no Oxycodone 5-10 mg q4h PRN moderate pain no Prenatal vitamin no  ALLERGIES Vaginal progesterone Aripiprazole Prenatal vitamins Sulfa antibiotics  SOCHX No PCP. Has a 65-month old son and a 27 year old daughter who she says is adopted but she still has visitation with her. Independent in ADLs, IADLs. Doesn't work currently.   FAMHX M: T2DM, HTN, MDD F: HTN  No fam hx mental illness.  -------------------------------------- July 15 Feels better this morning, denies CP, abdominal pain, N/V, or diarrhea Husband separated May 2024, husband in new relationship, new partner answered phone when she tried calling ex back after he left a VM early in the morning. New partner stated "go kill yourself', patient thinks she did it to get back at her husband for having a partner that would say that to her. No current thoughts of harming herself. Psychiatry has not come in to evaluate the patient yet. Wants to call someone to make sure son is ok (infant 6 months?). Sitter in room, stepped out for the interview.   Custody agreement with ex husband 50/50, has kids Tuesday to Friday   [name] is a friend who sees patient every day   Called EMS as soon as she took the pills  Updated patient on plan for today

## 2023-01-12 NOTE — ED Notes (Signed)
Pt in no acute distress, no complaints. Vitals stable. Call bell in reach.

## 2023-01-12 NOTE — ED Notes (Signed)
Pt belongings placed in locker 7, valuables given to security

## 2023-01-12 NOTE — ED Notes (Signed)
Mother Dennie Maizes [phone] would like an update immediately

## 2023-01-12 NOTE — H&P (Signed)
Date: 01/13/2023               Patient Name:  Kristen Padilla MRN: [id number]  DOB: [birth year] Age / Sex: 32 y.o., female   PCP: Pcp, No         Medical Service: Internal Medicine Teaching Service         Attending Physician: Dickie La, MD      First Contact: Dr. Faith Rogue, DO Pager [phone]    Second Contact: Dr. Morene Crocker, MD Pager [phone]         After Hours (After 5p/  First Contact Pager: [phone]  weekends / holidays): Second Contact Pager: [phone]   SUBJECTIVE   Chief Complaint: Suicide attempt  History of Present Illness:   Kristen Padilla is a 32 y.o. with a prior history of multiple suicide attempts (2014 and 2021), depression, and GAD who presents today after having taken 25-30 tablets of 150mg  of Wellbutrin at around 4pm. She did this after an argument with her previous boyfriend of about 9 years who she has a 23mo old with. The ex boyfriend has a new girlfriend and the girlfriend was talking about him now being a family with her and at one point said "you should kill yourself". Ms Padilla states that she did this out of impulse after she was told to do it but also to get the attention of her ex, who is drifting away from her because "nobody wants him to be with me". She states that she used to go to group counseling and this used to help her significantly after her previous attempts, but she stopped going once she was done with therapy. She would like to go back. Denies any symptoms currently, including any chest palpitation, chest pain, N/V, SOB, abdominal pain. Denies taking any other medication.   ED Course: Poison control was called. Given activated charcoal and of Potassium. Serial EKGs ordered. Advised to monitor Mg and K.   Meds:   Tylenol PRN for HA  Used to take but denies currently taking:   Ferrous sulfate 325 mg daily Ibuprofen 600 mg daily Oxycodone 5-10 mg q4h PRN moderate pain Prenatal vitamin no -  has allergies (rash)  Allergies  Allergen Reactions   Progesterone Swelling    Vaginal swelling when taken vaginally   Aripiprazole Rash   Prenatal Vitamins Rash    States she got a rash from taking the prenatal the hospital offers    Sulfa Antibiotics Rash   Past Medical History Past Medical History:  Diagnosis Date   Anxiety, generalized    Depression    Miscarriage    Morbid obesity (HCC)    Suicidal behavior with attempted self-injury (HCC) 2014   ibuprofen OD   Suicidal behavior with attempted self-injury (HCC) 2022   tylenol and NyQuil OD   UTI (urinary tract infection)    Past Surgical History:  Procedure Laterality Date   CESAREAN SECTION N/A 06/29/2022   Procedure: CESAREAN SECTION;  Surgeon: Hoover Browns, MD;  Location: [facility] LD ORS;  Service: Obstetrics;  Laterality: N/A;   HERNIA REPAIR     LAPAROSCOPIC GASTRIC SLEEVE RESECTION     Repair of displaced fracture of the left radius  03/2020    Social:   Lives With: Alone Occupation: Looking for a job Support: Has savings for now to afford meals. Fully functional.  Has a 43/31 yo who is adopted and a 9 mo who is living with Dad.  Family History:  Family History  Problem Relation Age of Onset   Diabetes Mother    Hypertension Mother    Depression Mother    Hypertension Father     Allergies: Allergies as of 01/12/2023 - Review Complete 01/12/2023  Allergen Reaction Noted   Progesterone Swelling 06/17/2022   Aripiprazole Rash 08/07/2011   Prenatal vitamins Rash 06/29/2022   Sulfa antibiotics Rash 08/07/2011    Review of Systems: A complete ROS was negative except as per HPI.   OBJECTIVE:   Physical Exam: Blood pressure 103/66, pulse 97, temperature 98.9 F (37.2 C), temperature source Oral, resp. rate 18, height 5\' 2"  (1.575 m), weight 83.9 kg, SpO2 100%, unknown if currently breastfeeding.   Constitutional: well-appearing, sitting in bed,in no acute distress HENT: normocephalic atraumatic, mucous  membranes moist Eyes: conjunctiva non-erythematous Neck: supple Cardiovascular: tachycardic, no m/r/g Pulmonary/Chest: normal work of breathing on room air, lungs clear to auscultation bilaterally. No wheezing, rales, ronchi. Abdominal: soft, non-tender, non-distended MSK: normal bulk and tone Neurological: alert & oriented x 3, 5/5 strength in bilateral upper and lower extremities, normal gait Skin: warm and dry Psych: Looks depressed and anxious. Disheveled. Able to enjoy activities but has been feeling down.   Labs: CBC    Component Value Date/Time   WBC 8.3 01/13/2023 0321   RBC 4.26 01/13/2023 0321   HGB 10.0 (L) 01/13/2023 0321   HCT 32.6 (L) 01/13/2023 0321   PLT 396 01/13/2023 0321   MCV 76.5 (L) 01/13/2023 0321   MCH 23.5 (L) 01/13/2023 0321   MCHC 30.7 01/13/2023 0321   RDW 15.5 01/13/2023 0321   LYMPHSABS 2.0 01/12/2023 1852   MONOABS 0.6 01/12/2023 1852   EOSABS 0.1 01/12/2023 1852   BASOSABS 0.1 01/12/2023 1852     CMP     Component Value Date/Time   NA 139 01/13/2023 0321   K 4.1 01/13/2023 0321   CL 109 01/13/2023 0321   CO2 22 01/13/2023 0321   GLUCOSE 101 (H) 01/13/2023 0321   GLUCOSE 79 06/23/2022 0545   BUN <5 (L) 01/13/2023 0321   CREATININE 0.80 01/13/2023 0321   CALCIUM 8.3 (L) 01/13/2023 0321   PROT 6.6 01/13/2023 0321   ALBUMIN 3.4 (L) 01/13/2023 0321   AST 14 (L) 01/13/2023 0321   ALT 10 01/13/2023 0321   ALKPHOS 60 01/13/2023 0321   BILITOT 0.6 01/13/2023 0321   GFRNONAA >60 01/13/2023 0321   GFRAA >60 11/03/2016 2112    EKG: Significant for Sinus Tachycardia. No QT Prolongation.    ASSESSMENT & PLAN:   Assessment & Plan by Problem: Principal Problem:   Overdose, intentional self-harm, initial encounter (HCC)  Kristen Padilla is a 32 y.o. with a PMH significant for MDD, multiple suicidal attempts, and GAD who presented after ingesting 25-30 Wellbutrin pills of 150mg  each. She is asymptomatic but is tachycardic on  exam. Was given activated charcoal in the ED. Poison control was contacted who advised to monitor her for at least 24 hours. Wellbutrin OD can cause, but not limited to, seizures, agitation, tremors as well as cardiac toxicity presenting as tachycardia, hypertension, hypotension, cardiogenic shock, QRS or Qtc prolongation and Vtach. She has had multiple OD attempts and has not followed regularly with therapy. States she does benefit from group therapy.  #Wellbutrin OD Took between 25-30 pills at 4pm 7/14. Poison control was contacted and advised to monitor EKGs every 8 hours, and if normal after 24 hours, she would be medically clear from the OD. Activated charcoal given, K and  Mg repleated.  -EKG q8h for 24h -Check BMP and Mg q6h -Keep K > 4-4.4 -Keep Mg> 2  #Suicidal Attempt #MDD #GAD Used to take Wellbutrin although questionable efficacy on previous notes but found group therapy most effective.  -Consult psych   #anemia Used to take ferrous sulfate for iron deficiency anemia. Not currently taking.  -Hold until stable. -Consider iron labs then  Diet: Normal VTE: None IVF: None,None Code: Full  Prior to Admission Living Arrangement: Home Anticipated Discharge Location: Home Barriers to Discharge: Suicidal  Dispo: Admit patient to Inpatient with expected length of stay greater than 2 midnights.  Signed: [facility]  Internal Medicine Resident, PGY-1 [facility]  Pager: [phone]  01/13/2023, 5:36 AM

## 2023-01-13 ENCOUNTER — Other Ambulatory Visit (HOSPITAL_COMMUNITY): Payer: Self-pay

## 2023-01-13 ENCOUNTER — Other Ambulatory Visit: Payer: Self-pay

## 2023-01-13 ENCOUNTER — Encounter (HOSPITAL_COMMUNITY): Payer: Self-pay | Admitting: Internal Medicine

## 2023-01-13 DIAGNOSIS — T50902D Poisoning by unspecified drugs, medicaments and biological substances, intentional self-harm, subsequent encounter: Secondary | ICD-10-CM

## 2023-01-13 LAB — CBC
HCT: 32.6 % — ABNORMAL LOW (ref 36.0–46.0)
Hemoglobin: 10 g/dL — ABNORMAL LOW (ref 12.0–15.0)
MCH: 23.5 pg — ABNORMAL LOW (ref 26.0–34.0)
MCHC: 30.7 g/dL (ref 30.0–36.0)
MCV: 76.5 fL — ABNORMAL LOW (ref 80.0–100.0)
Platelets: 396 10*3/uL (ref 150–400)
RBC: 4.26 MIL/uL (ref 3.87–5.11)
RDW: 15.5 % (ref 11.5–15.5)
WBC: 8.3 10*3/uL (ref 4.0–10.5)
nRBC: 0 % (ref 0.0–0.2)

## 2023-01-13 LAB — COMPREHENSIVE METABOLIC PANEL
ALT: 10 U/L (ref 0–44)
AST: 14 U/L — ABNORMAL LOW (ref 15–41)
Albumin: 3.4 g/dL — ABNORMAL LOW (ref 3.5–5.0)
Alkaline Phosphatase: 60 U/L (ref 38–126)
Anion gap: 8 (ref 5–15)
BUN: <5 mg/dL — ABNORMAL LOW (ref 6–20)
CO2: 22 mmol/L (ref 22–32)
Calcium: 8.3 mg/dL — ABNORMAL LOW (ref 8.9–10.3)
Chloride: 109 mmol/L (ref 98–111)
Creatinine, Ser: 0.8 mg/dL (ref 0.44–1.00)
GFR, Estimated: >60 mL/min (ref >60)
Glucose, Bld: 101 mg/dL — ABNORMAL HIGH (ref 70–99)
Potassium: 4.1 mmol/L (ref 3.5–5.1)
Sodium: 139 mmol/L (ref 135–145)
Total Bilirubin: 0.6 mg/dL (ref 0.3–1.2)
Total Protein: 6.6 g/dL (ref 6.5–8.1)

## 2023-01-13 LAB — MAGNESIUM
Magnesium: 2 mg/dL (ref 1.7–2.4)
Magnesium: 1.9 mg/dL (ref 1.7–2.4)
Magnesium: 2.5 mg/dL — ABNORMAL HIGH (ref 1.7–2.4)
Magnesium: 2.3 mg/dL (ref 1.7–2.4)

## 2023-01-13 LAB — POTASSIUM
Potassium: 3.8 mmol/L (ref 3.5–5.1)
Potassium: 4.1 mmol/L (ref 3.5–5.1)
Potassium: 3.7 mmol/L (ref 3.5–5.1)
Potassium: 3.7 mmol/L (ref 3.5–5.1)

## 2023-01-13 LAB — HIV ANTIBODY (ROUTINE TESTING W REFLEX): HIV Screen 4th Generation wRfx: NONREACTIVE

## 2023-01-13 MED ORDER — POTASSIUM CHLORIDE 10 MEQ/100ML IV SOLN: 10.0000 meq | INTRAVENOUS | Status: DC

## 2023-01-13 MED ORDER — MAGNESIUM SULFATE 2 GM/50ML IV SOLN
2.0000 g | Freq: Once | INTRAVENOUS | Status: AC
  Administered 2023-01-13: 2 g via INTRAVENOUS
  Filled 2023-01-13: qty 50

## 2023-01-13 MED ORDER — POTASSIUM CHLORIDE 10 MEQ/100ML IV SOLN
10.0000 meq | INTRAVENOUS | Status: AC
  Administered 2023-01-13 – 2023-01-14 (×4): 10 meq via INTRAVENOUS
  Filled 2023-01-13 (×4): qty 100
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
## 2023-01-13 NOTE — Plan of Care (Signed)
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
## 2023-01-13 NOTE — Progress Notes (Signed)
Patient's cell phone retrieved from security and returned to patient per patient's request. Patient educated and cautioned about phone confiscation  in the event of self harm or emotional/psychological distress.

## 2023-01-13 NOTE — Progress Notes (Addendum)
Pt stated she doesn't want her mom or sister to be able to call and get any information. Pt notified that we will not give anyone information or updates.

## 2023-01-13 NOTE — ED Notes (Signed)
ED TO INPATIENT HANDOFF REPORT  ED Nurse Name and Phone #: [phone]  S Name/Age/Gender Kristen Padilla 32 y.o. female Room/Bed: 030C/030C  Code Status   Code Status: Full Code  Home/SNF/Other Home Patient oriented to: self, place, time, and situation Is this baseline? Yes   Triage Complete: Triage complete  Chief Complaint Overdose, intentional self-harm, initial encounter [facility]) [T50.902A]  Triage Note Seen here yesterday for same complaints. Reports worsening depression. Took 25 bupropion pills 150mg  at 1600 today. Alert and oriented x 4. Pt denies SI at this time. Reports family problems and states, "I just have problems with my husband and I don't even know why I took the pills." Hx of drug OD on 2021.    Allergies Allergies  Allergen Reactions   Progesterone Swelling    Vaginal swelling when taken vaginally   Aripiprazole Rash   Prenatal Vitamins Rash    States she got a rash from taking the prenatal the hospital offers    Sulfa Antibiotics Rash    Level of Care/Admitting Diagnosis ED Disposition     ED Disposition  Admit   Condition  --   Comment  Hospital Area: [facility] [[id number]]  Level of Care: Progressive [102]  Admit to Progressive based on following criteria: ACUTE MENTAL DISORDER-RELATED Drug/Alcohol Ingestion/Overdose/Withdrawal, Suicidal Ideation/attempt requiring safety sitter and < Q2h monitoring/assessments, moderate to severe agitation that is managed with medication/sitter, CIWA-Ar score < 20.  Admit to Progressive based on following criteria: CARDIOVASCULAR & THORACIC of moderate stability with acute coronary syndrome symptoms/low risk myocardial infarction/hypertensive urgency/arrhythmias/heart failure potentially compromising stability and stable post cardiovascular intervention patients.  May admit patient to [facility] or [facility] if equivalent level of care is available:: No  Covid Evaluation: Asymptomatic -  no recent exposure (last 10 days) testing not required  Diagnosis: Overdose, intentional self-harm, initial encounter [facility]) [[id number]]  Admitting Physician: Reymundo Poll [[id number]]  Attending Physician: Reymundo Poll [[id number]]  Certification:: I certify this patient will need inpatient services for at least 2 midnights  Estimated Length of Stay: 2          B Medical/Surgery History Past Medical History:  Diagnosis Date   Depression    Miscarriage    Morbid obesity (HCC)    UTI (urinary tract infection)    Past Surgical History:  Procedure Laterality Date   CESAREAN SECTION N/A 06/29/2022   Procedure: CESAREAN SECTION;  Surgeon: Hoover Browns, MD;  Location: [facility] LD ORS;  Service: Obstetrics;  Laterality: N/A;   HERNIA REPAIR     LAPAROSCOPIC GASTRIC SLEEVE RESECTION     Repair of displaced fracture of the left radius  03/2020     A IV Location/Drains/Wounds Patient Lines/Drains/Airways Status     Active Line/Drains/Airways     Name Placement date Placement time Site Days   Peripheral IV 01/12/23 20 G Right Antecubital 01/12/23  1859  Antecubital  1            Intake/Output Last 24 hours  Intake/Output Summary (Last 24 hours) at 01/13/2023 0523 Last data filed at 01/12/2023 2141 Gross per 24 hour  Intake 100.64 ml  Output --  Net 100.64 ml    Labs/Imaging Results for orders placed or performed during the hospital encounter of 01/12/23 (from the past 48 hour(s))  Comprehensive metabolic panel     Status: Abnormal   Collection Time: 01/12/23  6:52 PM  Result Value Ref Range   Sodium 138 135 - 145 mmol/L  Potassium 3.3 (L) 3.5 - 5.1 mmol/L   Chloride 106 98 - 111 mmol/L   CO2 22 22 - 32 mmol/L   Glucose, Bld 81 70 - 99 mg/dL    Comment: Glucose reference range applies only to samples taken after fasting for at least 8 hours.   BUN 6 6 - 20 mg/dL   Creatinine, Ser 8.46 0.44 - 1.00 mg/dL   Calcium 8.8 (L) 8.9 - 10.3 mg/dL   Total Protein 7.2 6.5 - 8.1  g/dL   Albumin 3.6 3.5 - 5.0 g/dL   AST 14 (L) 15 - 41 U/L   ALT 13 0 - 44 U/L   Alkaline Phosphatase 64 38 - 126 U/L   Total Bilirubin 0.6 0.3 - 1.2 mg/dL   GFR, Estimated >96 >29 mL/min    Comment: (NOTE) Calculated using the CKD-EPI Creatinine Equation (2021)    Anion gap 10 5 - 15    Comment: Performed at [facility] Lab, [address] [place], [place] [address]  Salicylate level     Status: Abnormal   Collection Time: 01/12/23  6:52 PM  Result Value Ref Range   Salicylate Lvl <7.0 (L) 7.0 - 30.0 mg/dL    Comment: Performed at [facility], [address] [place], [place] [address]  Acetaminophen level     Status: Abnormal   Collection Time: 01/12/23  6:52 PM  Result Value Ref Range   Acetaminophen (Tylenol), Serum <10 (L) 10 - 30 ug/mL    Comment: (NOTE) Therapeutic concentrations vary significantly. A range of 10-30 ug/mL  may be an effective concentration for many patients. However, some  are best treated at concentrations outside of this range. Acetaminophen concentrations >150 ug/mL at 4 hours after ingestion  and >50 ug/mL at 12 hours after ingestion are often associated with  toxic reactions.  Performed at [facility] Lab, [address] [place], [place] [address]   Ethanol     Status: None   Collection Time: 01/12/23  6:52 PM  Result Value Ref Range   Alcohol, Ethyl (B) <10 <10 mg/dL    Comment: (NOTE) Lowest detectable limit for serum alcohol is 10 mg/dL.  For medical purposes only. Performed at [facility] Lab, [address] [place], [place] [address]   CBC WITH DIFFERENTIAL     Status: Abnormal   Collection Time: 01/12/23  6:52 PM  Result Value Ref Range   WBC 9.2 4.0 - 10.5 K/uL   RBC 4.51 3.87 - 5.11 MIL/uL   Hemoglobin 10.8 (L) 12.0 - 15.0 g/dL   HCT 40.3 (L) 47.4 - 25.9 %   MCV 77.8 (L) 80.0 - 100.0 fL   MCH 23.9 (L) 26.0 - 34.0 pg   MCHC 30.8 30.0 - 36.0 g/dL   RDW 56.3 87.5 - 64.3 %   Platelets 429 (H) 150 - 400 K/uL    nRBC 0.0 0.0 - 0.2 %   Neutrophils Relative % 71 %   Neutro Abs 6.5 1.7 - 7.7 K/uL   Lymphocytes Relative 21 %   Lymphs Abs 2.0 0.7 - 4.0 K/uL   Monocytes Relative 6 %   Monocytes Absolute 0.6 0.1 - 1.0 K/uL   Eosinophils Relative 1 %   Eosinophils Absolute 0.1 0.0 - 0.5 K/uL   Basophils Relative 1 %   Basophils Absolute 0.1 0.0 - 0.1 K/uL   Immature Granulocytes 0 %   Abs Immature Granulocytes 0.03 0.00 - 0.07 K/uL    Comment: Performed  at [facility] Lab, [address], [place], [place] [address]  hCG, serum, qualitative     Status: None   Collection Time: 01/12/23  6:52 PM  Result Value Ref Range   Preg, Serum NEGATIVE NEGATIVE    Comment:        THE SENSITIVITY OF THIS METHODOLOGY IS >10 mIU/mL. Performed at [facility] Lab, [address] [place], [place] [address]   Magnesium     Status: None   Collection Time: 01/12/23  6:52 PM  Result Value Ref Range   Magnesium 2.0 1.7 - 2.4 mg/dL    Comment: Performed at [facility], [address] [place], [place] [address]  CBG monitoring, ED     Status: None   Collection Time: 01/12/23  7:02 PM  Result Value Ref Range   Glucose-Capillary 84 70 - 99 mg/dL    Comment: Glucose reference range applies only to samples taken after fasting for at least 8 hours.  Urine rapid drug screen (hosp performed)     Status: None   Collection Time: 01/12/23 10:19 PM  Result Value Ref Range   Opiates NONE DETECTED NONE DETECTED   Cocaine NONE DETECTED NONE DETECTED   Benzodiazepines NONE DETECTED NONE DETECTED   Amphetamines NONE DETECTED NONE DETECTED   Tetrahydrocannabinol NONE DETECTED NONE DETECTED   Barbiturates NONE DETECTED NONE DETECTED    Comment: (NOTE) DRUG SCREEN FOR MEDICAL PURPOSES ONLY.  IF CONFIRMATION IS NEEDED FOR ANY PURPOSE, NOTIFY LAB WITHIN 5 DAYS.  LOWEST DETECTABLE LIMITS FOR URINE DRUG SCREEN Drug Class                     Cutoff (ng/mL) Amphetamine and metabolites    1000 Barbiturate and  metabolites    200 Benzodiazepine                 200 Opiates and metabolites        300 Cocaine and metabolites        300 THC                            50 Performed at [facility] Lab, [address] [place], [place] [address]   HIV Antibody (routine testing w rflx)     Status: None   Collection Time: 01/13/23 12:55 AM  Result Value Ref Range   HIV Screen 4th Generation wRfx Non Reactive Non Reactive    Comment: Performed at [facility] Lab, [address], [place], [place] [address]  Magnesium     Status: None   Collection Time: 01/13/23 12:55 AM  Result Value Ref Range   Magnesium 2.0 1.7 - 2.4 mg/dL    Comment: Performed at [facility] Lab, [address] [place], [place] [address]  Potassium     Status: None   Collection Time: 01/13/23 12:55 AM  Result Value Ref Range   Potassium 3.8 3.5 - 5.1 mmol/L    Comment: Performed at [facility] Lab, [address], [place], [place] [address]  Magnesium     Status: None   Collection Time: 01/13/23  3:21 AM  Result Value Ref Range   Magnesium 1.9 1.7 - 2.4 mg/dL    Comment: Performed at [facility] Lab, [address] [place], [place] [address]  Comprehensive metabolic panel     Status: Abnormal   Collection Time: 01/13/23  3:21 AM  Result  Value Ref Range   Sodium 139 135 - 145 mmol/L   Potassium 4.1 3.5 - 5.1 mmol/L   Chloride 109 98 - 111 mmol/L   CO2 22 22 - 32 mmol/L   Glucose, Bld 101 (H) 70 - 99 mg/dL    Comment: Glucose reference range applies only to samples taken after fasting for at least 8 hours.   BUN <5 (L) 6 - 20 mg/dL   Creatinine, Ser 2.95 0.44 - 1.00 mg/dL   Calcium 8.3 (L) 8.9 - 10.3 mg/dL   Total Protein 6.6 6.5 - 8.1 g/dL   Albumin 3.4 (L) 3.5 - 5.0 g/dL   AST 14 (L) 15 - 41 U/L   ALT 10 0 - 44 U/L   Alkaline Phosphatase 60 38 - 126 U/L   Total Bilirubin 0.6 0.3 - 1.2 mg/dL   GFR, Estimated >62 >13 mL/min    Comment: (NOTE) Calculated using the CKD-EPI Creatinine Equation (2021)     Anion gap 8 5 - 15    Comment: Performed at [facility] Lab, [address], [place], [place] [address]  CBC     Status: Abnormal   Collection Time: 01/13/23  3:21 AM  Result Value Ref Range   WBC 8.3 4.0 - 10.5 K/uL   RBC 4.26 3.87 - 5.11 MIL/uL   Hemoglobin 10.0 (L) 12.0 - 15.0 g/dL   HCT 84.6 (L) 96.2 - 95.2 %   MCV 76.5 (L) 80.0 - 100.0 fL   MCH 23.5 (L) 26.0 - 34.0 pg   MCHC 30.7 30.0 - 36.0 g/dL   RDW 84.1 32.4 - 40.1 %   Platelets 396 150 - 400 K/uL   nRBC 0.0 0.0 - 0.2 %    Comment: Performed at [facility] Lab, [address], [place], [place] [address]   No results found.  Pending Labs Unresulted Labs (From admission, onward)     Start     Ordered   01/13/23 0030  Magnesium  Every 6 hours,   R (with TIMED occurrences)      01/12/23 2348   01/13/23 0030  Potassium  Every 6 hours,   R (with TIMED occurrences)      01/12/23 2348            Vitals/Pain Today's Vitals   01/13/23 0100 01/13/23 0200 01/13/23 0358 01/13/23 0500  BP: 116/69 101/69  113/72  Pulse: 90 94  86  Resp: (!) 22 (!) 21  19  Temp:   98.7 F (37.1 C)   TempSrc:      SpO2: 100% 100%  100%  Weight:      Height:      PainSc:        Isolation Precautions No active isolations  Medications Medications  rivaroxaban (XARELTO) tablet 10 mg (has no administration in time range)  magnesium sulfate IVPB 2 g 50 mL (has no administration in time range)  charcoal activated (NO SORBITOL) (ACTIDOSE-AQUA) suspension 85 g (85 g Oral Given 01/12/23 1945)  potassium chloride 10 mEq in 100 mL IVPB (10 mEq Intravenous New Bag/Given 01/13/23 0414)    Mobility walks     Focused Assessments Cardiac Assessment Handoff:  Cardiac Rhythm: Sinus tachycardia No results found for: "CKTOTAL", "CKMB", "CKMBINDEX", "TROPONINI" No results found for: "DDIMER" Does the Patient currently have chest pain? No   , Neuro Assessment Handoff:  Swallow screen pass? Yes  Cardiac Rhythm: Sinus tachycardia        Neuro Assessment: Within Defined Limits  Neuro Checks:      Has TPA been given? No If patient is a Neuro Trauma and patient is going to OR before floor call report to 4N Charge nurse: [phone] or [phone]  , Pulmonary Assessment Handoff:  Lung sounds:   O2 Device: Room Air      R Recommendations: See Admitting Provider Note  Report given to:   Additional Notes: n/a

## 2023-01-13 NOTE — Plan of Care (Signed)

## 2023-01-13 NOTE — Progress Notes (Addendum)
Spoke with pt about all the things she has going on at home that are affecting her mental health. Pt was asked if the medications she was previously prescribed helped at all and pt stated she never took them because she doesn't like taking medications.

## 2023-01-13 NOTE — TOC Benefit Eligibility Note (Signed)
Pharmacy Patient Advocate Encounter  Insurance verification completed.    The patient is insured through [facility] MEDICAID   Ran test claim for Zurzuvae 20 mg capsuels and Product Not Covered   This test claim was processed through [employer]- copay amounts may vary at other pharmacies due to [employer], or as the patient moves through the different stages of their insurance plan.    Roland Earl, CPHT Pharmacy Patient Advocate Specialist [facility] Patient Advocate Team Direct Number: [phone]  Fax: [phone]

## 2023-01-13 NOTE — Progress Notes (Signed)
Pt has been accepted to [facility] TOMORROW 01/14/2023. Bed assignment: 403-1  Pt meets inpatient criteria per Lorri Frederick, MD  Attending Physician will be Phineas Inches, MD  Report can be called to: - Adult unit: [phone]  Pt can arrive after 12 PM  Care Team Notified: [facility] Rona Ravens, RN and Lorri Frederick, MD  [place], [place]  01/13/2023 1:43 PM
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
## 2023-01-14 ENCOUNTER — Encounter (HOSPITAL_COMMUNITY): Payer: Self-pay | Admitting: Student in an Organized Health Care Education/Training Program

## 2023-01-14 ENCOUNTER — Inpatient Hospital Stay (HOSPITAL_COMMUNITY)
Admission: AD | Admit: 2023-01-14 | Discharge: 2023-01-18 | DRG: 881 | Disposition: A | Discharge status: Home / Self Care | Payer: Medicaid Other | Source: Intra-hospital | Attending: Psychiatry | Admitting: Psychiatry

## 2023-01-14 DIAGNOSIS — Z635 Disruption of family by separation and divorce: Secondary | ICD-10-CM

## 2023-01-14 DIAGNOSIS — T50902D Poisoning by unspecified drugs, medicaments and biological substances, intentional self-harm, subsequent encounter: Secondary | ICD-10-CM | POA: Diagnosis not present

## 2023-01-14 DIAGNOSIS — D509 Iron deficiency anemia, unspecified: Secondary | ICD-10-CM | POA: Diagnosis not present

## 2023-01-14 DIAGNOSIS — Z9151 Personal history of suicidal behavior: Secondary | ICD-10-CM

## 2023-01-14 DIAGNOSIS — T43292A Poisoning by other antidepressants, intentional self-harm, initial encounter: Secondary | ICD-10-CM | POA: Diagnosis not present

## 2023-01-14 DIAGNOSIS — F4321 Adjustment disorder with depressed mood: Secondary | ICD-10-CM | POA: Diagnosis present

## 2023-01-14 DIAGNOSIS — F4323 Adjustment disorder with mixed anxiety and depressed mood: Secondary | ICD-10-CM | POA: Diagnosis present

## 2023-01-14 DIAGNOSIS — D649 Anemia, unspecified: Secondary | ICD-10-CM | POA: Diagnosis present

## 2023-01-14 DIAGNOSIS — T50902A Poisoning by unspecified drugs, medicaments and biological substances, intentional self-harm, initial encounter: Principal | ICD-10-CM | POA: Diagnosis present

## 2023-01-14 DIAGNOSIS — F329 Major depressive disorder, single episode, unspecified: Secondary | ICD-10-CM | POA: Diagnosis not present

## 2023-01-14 DIAGNOSIS — F332 Major depressive disorder, recurrent severe without psychotic features: Secondary | ICD-10-CM | POA: Diagnosis not present

## 2023-01-14 LAB — CBC
HCT: 32.4 % — ABNORMAL LOW (ref 36.0–46.0)
Hemoglobin: 10 g/dL — ABNORMAL LOW (ref 12.0–15.0)
MCH: 23.6 pg — ABNORMAL LOW (ref 26.0–34.0)
MCHC: 30.9 g/dL (ref 30.0–36.0)
MCV: 76.6 fL — ABNORMAL LOW (ref 80.0–100.0)
Platelets: 366 10*3/uL (ref 150–400)
RBC: 4.23 MIL/uL (ref 3.87–5.11)
RDW: 15.9 % — ABNORMAL HIGH (ref 11.5–15.5)
WBC: 6.8 10*3/uL (ref 4.0–10.5)
nRBC: 0 % (ref 0.0–0.2)

## 2023-01-14 LAB — RENAL FUNCTION PANEL
Albumin: 3.5 g/dL (ref 3.5–5.0)
Anion gap: 5 (ref 5–15)
BUN: 5 mg/dL — ABNORMAL LOW (ref 6–20)
CO2: 24 mmol/L (ref 22–32)
Calcium: 8.4 mg/dL — ABNORMAL LOW (ref 8.9–10.3)
Chloride: 109 mmol/L (ref 98–111)
Creatinine, Ser: 0.69 mg/dL (ref 0.44–1.00)
GFR, Estimated: >60 mL/min (ref >60)
Glucose, Bld: 90 mg/dL (ref 70–99)
Phosphorus: 3.7 mg/dL (ref 2.5–4.6)
Potassium: 3.5 mmol/L (ref 3.5–5.1)
Sodium: 138 mmol/L (ref 135–145)

## 2023-01-14 LAB — POTASSIUM
Potassium: 3.8 mmol/L (ref 3.5–5.1)
Potassium: 4.6 mmol/L (ref 3.5–5.1)

## 2023-01-14 MED ORDER — DIPHENHYDRAMINE HCL 25 MG PO CAPS: 50.0000 mg | ORAL_CAPSULE | Freq: Three times a day (TID) | ORAL | Status: DC | PRN

## 2023-01-14 MED ORDER — HALOPERIDOL LACTATE 5 MG/ML IJ SOLN: 5.0000 mg | Freq: Three times a day (TID) | INTRAMUSCULAR | Status: DC | PRN

## 2023-01-14 MED ORDER — POTASSIUM CHLORIDE 10 MEQ/100ML IV SOLN: 10.0000 meq | INTRAVENOUS | Status: DC

## 2023-01-14 MED ORDER — HALOPERIDOL 5 MG PO TABS: 5.0000 mg | ORAL_TABLET | Freq: Three times a day (TID) | ORAL | Status: DC | PRN

## 2023-01-14 MED ORDER — ACETAMINOPHEN 325 MG PO TABS: 650.0000 mg | ORAL_TABLET | Freq: Four times a day (QID) | ORAL | Status: DC | PRN

## 2023-01-14 MED ORDER — POTASSIUM CHLORIDE CRYS ER 20 MEQ PO TBCR
20.0000 meq | EXTENDED_RELEASE_TABLET | Freq: Once | ORAL | Status: AC
  Administered 2023-01-14: 20 meq via ORAL
  Filled 2023-01-14: qty 1

## 2023-01-14 MED ORDER — LORAZEPAM 1 MG PO TABS: 2.0000 mg | ORAL_TABLET | Freq: Three times a day (TID) | ORAL | Status: DC | PRN

## 2023-01-14 MED ORDER — HYDROXYZINE HCL 25 MG PO TABS
25.0000 mg | ORAL_TABLET | Freq: Three times a day (TID) | ORAL | Status: DC | PRN
  Administered 2023-01-14: 25 mg via ORAL
  Filled 2023-01-14 (×2): qty 1

## 2023-01-14 MED ORDER — DIPHENHYDRAMINE HCL 50 MG/ML IJ SOLN: 50.0000 mg | Freq: Three times a day (TID) | INTRAMUSCULAR | Status: DC | PRN

## 2023-01-14 MED ORDER — LORAZEPAM 2 MG/ML IJ SOLN: 2.0000 mg | Freq: Three times a day (TID) | INTRAMUSCULAR | Status: DC | PRN

## 2023-01-14 NOTE — Plan of Care (Signed)
  Problem: Education: Goal: Knowledge of [facility] Education information/materials will improve Outcome: Progressing Goal: Verbalization of understanding the information provided will improve Outcome: Progressing   
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
## 2023-01-14 NOTE — Plan of Care (Signed)
  Problem: Education: Goal: Knowledge of [facility] Education information/materials will improve Outcome: Progressing Goal: Emotional status will improve Outcome: Progressing Goal: Mental status will improve Outcome: Progressing Goal: Verbalization of understanding the information provided will improve Outcome: Progressing   

## 2023-01-14 NOTE — TOC Transition Note (Signed)
Transition of Care [facility]) - CM/SW Discharge Note   Patient Details  Name: Kristen Padilla MRN: [id number] Date of Birth: Feb 28, 1991  [facility] of [facility]) CM/SW Contact:  Delilah Shan, LCSWA Phone Number: 01/14/2023, 11:09 AM   Clinical Narrative:        Attending Physician will be Phineas Inches, MD   Patient will DC to: [facility] -Bed assignment: 303-2  Anticipated DC date: 01/14/2023  Family notified: Patient declined   Transport by: [employer]   ?  Per MD patient ready for DC to [facility] . RN, patient,  and facility notified of DC.  RN given number for report Adult unit: [phone]. [facility] will transport patient to [facility].  CSW signing off.            Patient Goals and CMS Choice      Discharge Placement                         Discharge Plan and Services Additional resources added to the After Visit Summary for                                       Social Determinants of Health (SDOH) Interventions SDOH Screenings   Food Insecurity: No Food Insecurity (01/13/2023)  Housing: Low Risk  (01/13/2023)  Transportation Needs: No Transportation Needs (01/13/2023)  Utilities: Not At Risk (01/13/2023)  Alcohol Screen: Low Risk  (06/05/2020)  Depression (PHQ2-9): Medium Risk (09/07/2020)  Financial Resource Strain: Low Risk  (06/05/2020)  Physical Activity: Insufficiently Active (08/10/2020)  Social Connections: Unknown (11/11/2021)   Received from [facility]  Stress: Stress Concern Present (06/05/2020)  Tobacco Use: Low Risk  (01/12/2023)     Readmission Risk Interventions     No data to display

## 2023-01-14 NOTE — Plan of Care (Signed)

## 2023-01-14 NOTE — Tx Team (Signed)
Initial Treatment Plan 01/14/2023 6:41 PM [name] YIR:[id number]    PATIENT STRESSORS: Marital or family conflict     PATIENT STRENGTHS: Communication skills  Supportive family/friends    PATIENT IDENTIFIED PROBLEMS: Suicidal ideation  Depression  Anxiety   Crying spell               DISCHARGE CRITERIA:  Ability to meet basic life and health needs Verbal commitment to aftercare and medication compliance  PRELIMINARY DISCHARGE PLAN: Return to previous living arrangement Return to previous work or school arrangements  PATIENT/FAMILY INVOLVEMENT: This treatment plan has been presented to and reviewed with the patient, Kristen Padilla, has been given the opportunity to ask questions and make suggestions.  Melvenia Needles, RN 01/14/2023, 6:41 PM

## 2023-01-14 NOTE — Discharge Summary (Signed)
Name: Kristen Padilla MRN: [id number] DOB: January 07, 1991 32 y.o. PCP: Pcp, No  Date of Admission: 01/12/2023  6:44 PM Date of Discharge: 01/14/2023 10:37 AM Attending Physician: Dr. Antony Contras  Discharge Diagnosis: Principal Problem:   Overdose, intentional self-harm, initial encounter [facility]) Active Problems:   Depression   GAD (generalized anxiety disorder)    Discharge Medications: Allergies as of 01/14/2023       Reactions   Progesterone Swelling   Vaginal swelling when taken vaginally   Aripiprazole Rash   Prenatal Vitamins Rash   States she got a rash from taking the prenatal the hospital offers    Sulfa Antibiotics Rash        Medication List     STOP taking these medications    buPROPion 150 MG 24 hr tablet Commonly known as: WELLBUTRIN XL   ibuprofen 600 MG tablet Commonly known as: ADVIL   oxyCODONE 5 MG immediate release tablet Commonly known as: Oxy IR/ROXICODONE   Prenatal 28-0.8 MG Tabs       TAKE these medications    ferrous sulfate 325 (65 FE) MG tablet Take 1 tablet (325 mg total) by mouth every other day.        Disposition and follow-up:   KristenAbigale Georganna Skeans Padilla was discharged from [facility] in Stable condition.  At the hospital follow up visit please address:  1.  Follow-up:  *Suicidal attempt -Please note that she has a history of suicidal attempts in the past -Please encourage patient to seek care/help when she has suicidal urges -Please encourage patient to seek care when she has urges to commit harm to self or others -Encourage patient to continue mental health care with therapy, medication, and psychiatry  -Please follow-up on TSH result/obtain TSH, B12   *Anemia -Please repeat CBC in 7-10 days -Consider further anemia work up such as an iron panel, etc     2.  Labs / imaging needed at time of follow-up: CBC, consider iron panel, TSH, B12  3.  Pending labs/ test needing  follow-up: TSH, EKG  4.  Medication Changes  STOPPED  -Wellbutrin/bupropion  -Ibuprofen   -oxycodone   -prenatal vitamin    ADDED  -none      Follow-up Appointments:  Follow-up Information     [facility] Follow up.   Contact information: [address], [place], [place] [address] [phone]                Hospital Course by problem list: #Intentional overdose #Suicide attempt #Depression and Anxiety Patient presented to the emergency department after she ingested 25-30 150mg  Wellbutrin XL 24 hour tablets around 4PM on 01/12/2023 and Poison control was contacted who recommended medical monitoring 24 hours.  During this hospitalization she received activated charcoal, magnesium supplementation, potassium supplementation, an EKG once every 8 hours to monitor for QTc prolongation, tachycardia, arrhythmias, and QRS widening.  She received potassium and magnesium monitoring as well. After monitoring her for greater than 24 hours,she is being discharged medically to inpatient psychiatry facility.       Discharge Subjective:  Today, she reported feeling fine. She denied chest pain, shortness of breath, abdominal pain, nausea, vomiting, and, diarrhea. Patient was able to tell us about the discharge plan to inpatient psychiatry and understood why it is important to go to inpatient psychiatry for help.   Discharge Exam:   Blood pressure 122/76, pulse 71, temperature 98.5 F (36.9 C), temperature source Oral, resp. rate 16, height  5\' 2"  (1.575 m), weight 83.9 kg, SpO2 99%, unknown if currently breastfeeding.  Constitutional:Non ill-appearing comfortably sitting in bed, in no acute distress Cardiovascular: regular rate and rhythm, no m/r/g Pulmonary/Chest: normal work of breathing on room air, lungs clear to auscultation bilaterally. No crackles  Abdominal: soft, non-tender, non-distended.  Neurological: alert & oriented MSK: no gross abnormalities. No pitting  edema Skin: warm and dry Psych: Depressed mood, flat affect  Pertinent Labs, Studies, and Procedures:     Latest Ref Rng & Units 01/14/2023    6:33 AM 01/13/2023    3:21 AM 01/12/2023    6:52 PM  CBC  WBC 4.0 - 10.5 K/uL 6.8  8.3  9.2   Hemoglobin 12.0 - 15.0 g/dL 40.9  81.1  91.4   Hematocrit 36.0 - 46.0 % 32.4  32.6  35.1   Platelets 150 - 400 K/uL 366  396  429        Latest Ref Rng & Units 01/14/2023    6:33 AM 01/13/2023   11:37 PM 01/13/2023    8:05 PM  CMP  Glucose 70 - 99 mg/dL  90    BUN 6 - 20 mg/dL  5    Creatinine 7.82 - 1.00 mg/dL  9.56    Sodium 213 - 086 mmol/L  138    Potassium 3.5 - 5.1 mmol/L 3.8  3.5  3.7   Chloride 98 - 111 mmol/L  109    CO2 22 - 32 mmol/L  24    Calcium 8.9 - 10.3 mg/dL  8.4      No results found.   Discharge Instructions: Discharge Instructions     Call MD for:  difficulty breathing, headache or visual disturbances   Complete by: As directed    Call MD for:  extreme fatigue   Complete by: As directed    Call MD for:  hives   Complete by: As directed    Call MD for:  persistant dizziness or light-headedness   Complete by: As directed    Call MD for:  persistant nausea and vomiting   Complete by: As directed    Call MD for:  redness, tenderness, or signs of infection (pain, swelling, redness, odor or green/yellow discharge around incision site)   Complete by: As directed    Call MD for:  severe uncontrolled pain   Complete by: As directed    Call MD for:  temperature >100.4   Complete by: As directed    Diet - low sodium heart healthy   Complete by: As directed    Discharge instructions   Complete by: As directed    For the attempted suicide: -You will be transferred to inpatient psychiatry for further care -We want you to be safe at home and to find the medications/therapy that will help you the most! -If you have any thoughts of harming yourself, others, or suicidal thoughts: Please call [phone] or [phone] and seek care at an  emergency department  Please continue to take the ferrous sulfate (325mg ) iron supplement for the low blood count (anemia)   We have STOPPED the following medications:  -Wellbutrin XL 150mg   You reported not taking these following medications, so we have discontinued them as well: -Advil (ibuprofen) 600Mg  -Oxycodone 5mg  -Prenatal vitamins   If you have any questions or concerns, please reach [place] at our internal medicine clinic at [phone].  After you are discharged from the behavior health hospital, please follow-up with psychiatry and your family doctor in 7  to 10 days or sooner.  If you experience the symptoms, please go to the nearest emergency department: -Thoughts of hurting yourself or others -Thoughts of suicide -Attempted suicide -Chest pain -Shortness of breath -Fever -Chills   Increase activity slowly   Complete by: As directed        Signed: Faith Rogue DO [facility] - PGY1 Pager: [phone] 01/14/2023, 10:37 AM    Please contact the on call pager after 5 pm and on weekends at [phone].

## 2023-01-14 NOTE — Progress Notes (Signed)
Report called to behavioral health, questions asked and answered. SBAR followed.

## 2023-01-14 NOTE — [facility] Group Notes (Signed)
Adult Psychoeducational Group Note  Date:  01/14/2023 Time:  10:48 PM  Group Topic/Focus:  Wrap-Up Group:   The focus of this group is to help patients review their daily goal of treatment and discuss progress on daily workbooks.  Participation Level:  Active  Participation Quality:  Appropriate  Affect:  Appropriate  Cognitive:  Appropriate  Insight: Appropriate  Engagement in Group:  Engaged  Modes of Intervention:  Discussion  Additional Comments:  Pt. Stated day was 9 and today goal was to accept the end of marriage and that was achieved. Coping skill used today was to not have contact and letters from anyone.  Joselyn Arrow 01/14/2023, 10:48 PM

## 2023-01-14 NOTE — Progress Notes (Signed)
   01/14/23 2105  Psych Admission Type (Psych Patients Only)  Admission Status Involuntary  Psychosocial Assessment  Patient Complaints Anxiety  Eye Contact Fair  Facial Expression Anxious  Affect Appropriate to circumstance  Speech Logical/coherent  Interaction Assertive  Motor Activity Other (Comment) (WDL)  Appearance/Hygiene Unremarkable  Behavior Characteristics Appropriate to situation  Mood Anxious  Thought Process  Coherency WDL  Content WDL  Delusions None reported or observed  Perception WDL  Hallucination None reported or observed  Judgment Poor  Confusion None  Danger to Self  Current suicidal ideation? Denies  Danger to Others  Danger to Others None reported or observed   During assessment, patient is alert and oriented.  Patient is preoccupied with discharging, stating, "I am not depressed.  Patient expresses to this writer that she has a 64 month old son and husband that she needs to get back to.  Patient denies SI/HI/AVH.  Patient denies pain.  Administered PRN Hydroxyzine per [facility] per patient request.  Patient is safe on the unit with q15 minute safety checks.

## 2023-01-15 ENCOUNTER — Encounter (HOSPITAL_COMMUNITY): Payer: Self-pay

## 2023-01-15 DIAGNOSIS — F4323 Adjustment disorder with mixed anxiety and depressed mood: Secondary | ICD-10-CM | POA: Diagnosis present

## 2023-01-15 LAB — TSH: TSH: 1.348 u[IU]/mL (ref 0.350–4.500)

## 2023-01-15 MED ORDER — LORAZEPAM 1 MG PO TABS: 2.0000 mg | ORAL_TABLET | Freq: Four times a day (QID) | ORAL | Status: DC | PRN

## 2023-01-15 MED ORDER — TRAZODONE HCL 50 MG PO TABS
50.0000 mg | ORAL_TABLET | Freq: Every evening | ORAL | Status: DC | PRN
  Filled 2023-01-15: qty 1

## 2023-01-15 MED ORDER — LORAZEPAM 2 MG/ML IJ SOLN: 2.0000 mg | Freq: Four times a day (QID) | INTRAMUSCULAR | Status: DC | PRN

## 2023-01-15 NOTE — Progress Notes (Signed)
Patient ID: Kristen Padilla, female   DOB: 14-Jan-1991, 32 y.o.   MRN: [id number] Patient presents with depressed mood, affect congruent. Kristen Padilla reports '' I really didn't take the overdose to die, it was more to prove a point that my husband is with someone who is harassing me and telling me to go ahead and overdose, so I thought okay bitch that's what I'll do to show you '' Patient denies any SI HI or AV hallucinations currently. She voiced concerns about length of stay and potential discharge date. Discussed process of IVC and typical length of stay. She completed her self inventory form and rates her depression, anxiety and hopelessness all at 0/10 on scale 10 being worst 0 being none.  Pt has been visible in the milieu, attending programming and interactive with her peers. She is eating and drinking well and able to make her needs known. Pt is safe, will con't to monitor.
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
## 2023-01-15 NOTE — Progress Notes (Signed)
   01/15/23 0532  15 Minute Checks  Location Bedroom  Visual Appearance Calm  Behavior Sleeping  Sleep (Behavioral Health Patients Only)  Calculate sleep? (Click Yes once per 24 hr at 0600 safety check) Yes  Documented sleep last 24 hours 7.5

## 2023-01-15 NOTE — [facility] Group Notes (Signed)
Adult Psychoeducational Group Note  Date:  01/15/2023 Time:  10:22 AM  Group Topic/Focus:  Emotional Education:   The focus of this group is to discuss what feelings/emotions are, and how they are experienced. Goals Group:   The focus of this group is to help patients establish daily goals to achieve during treatment and discuss how the patient can incorporate goal setting into their daily lives to aide in recovery.  Participation Level:  Active  Participation Quality:  Attentive  Affect:  Appropriate  Cognitive:  Alert  Insight: Appropriate  Engagement in Group:  Engaged  Modes of Intervention:  Education and Exploration  Additional Comments:  Pt participated in group today. Pt stated her goal is to have a good day and not think about her marriage. Facilitator educated the group on the Four Cues emotional, behavioral, physical and cognitive engaging the group in identifying their cues. Facilitator read a spiritual quote for today.     Kristen Padilla 01/15/2023, 10:22 AM

## 2023-01-15 NOTE — [facility] Group Notes (Signed)
[facility] Group Notes:  (Nursing/MHT/Case Management/Adjunct)  Date:  01/15/2023  Time:  11:56 AM  Type of Therapy:  Psychoeducational Skills  Participation Level:  Active  Participation Quality:  Appropriate  Affect:  Appropriate  Cognitive:  Appropriate  Insight:  Appropriate  Engagement in Group:  Engaged  Modes of Intervention:  Discussion, Education, and Exploration  Summary of Progress/Problems: Patients were given education on mindfulness and meditation practices to help with mental health wellness and anxiety reduction. Patients were also given poem '' There's a hole in my sidewalk '' and asked to explore behavioral patterns that are contributing to mental health distress. Pt shared and participated appropriately.  Kristen Padilla 01/15/2023, 11:56 AM

## 2023-01-15 NOTE — Plan of Care (Signed)

## 2023-01-15 NOTE — Group Note (Signed)
Recreation Therapy Group Note   Group Topic:Team Building  Group Date: 01/15/2023 Start Time: 0935 End Time: 1000 Facilitators: Worthy Boschert-McCall, LRT,CTRS Location: [address] Dayroom   Goal Area(s) Addresses:  Patient will effectively work with peer towards shared goal.  Patient will identify skills used to make activity successful.  Patient will share challenges and verbalize solution-driven approaches used. Patient will identify how skills used during activity can be used to reach post d/c goals.    Group Description: [employer]. Patients were provided the following materials: 4 drinking straws, 5 rubber bands, 5 paper clips, 2 index cards and 2 drinking cups. Using the provided materials patients were asked to build a launching mechanism to launch a ping pong ball across the room, approximately 10 feet. Patients were divided into teams of 3-5. Instructions required all materials be incorporated into the device, functionality of items left to the peer group's discretion.   Affect/Mood: N/A   Participation Level: Did not attend    Clinical Observations/Individualized Feedback:     Plan: Continue to engage patient in RT group sessions 2-3x/week.   Dena Esperanza-McCall, LRT,CTRS 01/15/2023 12:12 PM

## 2023-01-15 NOTE — BH IP Treatment Plan (Signed)
Interdisciplinary Treatment and Diagnostic Plan Update  01/15/2023 Time of Session: 10:41am Kristen Padilla MRN: [id number]  Principal Diagnosis: MDD (major depressive disorder)  Secondary Diagnoses: Principal Problem:   MDD (major depressive disorder)   Current Medications:  Current Facility-Administered Medications  Medication Dose Route Frequency Provider Last Rate Last Admin   acetaminophen (TYLENOL) tablet 650 mg  650 mg Oral Q6H PRN Carrion-Carrero, Margely, MD       diphenhydrAMINE (BENADRYL) capsule 50 mg  50 mg Oral TID PRN Carrion-Carrero, Margely, MD       Or   diphenhydrAMINE (BENADRYL) injection 50 mg  50 mg Intramuscular TID PRN Carrion-Carrero, Margely, MD       haloperidol (HALDOL) tablet 5 mg  5 mg Oral TID PRN Carrion-Carrero, Margely, MD       Or   haloperidol lactate (HALDOL) injection 5 mg  5 mg Intramuscular TID PRN Carrion-Carrero, Karle Starch, MD       hydrOXYzine (ATARAX) tablet 25 mg  25 mg Oral TID PRN Lorri Frederick, MD   25 mg at 01/14/23 2105   LORazepam (ATIVAN) tablet 2 mg  2 mg Oral TID PRN Carrion-Carrero, Karle Starch, MD       Or   LORazepam (ATIVAN) injection 2 mg  2 mg Intramuscular TID PRN Carrion-Carrero, Karle Starch, MD       PTA Medications: Medications Prior to Admission  Medication Sig Dispense Refill Last Dose   ferrous sulfate 325 (65 FE) MG tablet Take 1 tablet (325 mg total) by mouth every other day. (Patient not taking: Reported on 01/13/2023) 30 tablet 1     Patient Stressors: Marital or family conflict    Patient Strengths: Manufacturing systems engineer  Supportive family/friends   Treatment Modalities: Medication Management, Group therapy, Case management,  1 to 1 session with clinician, Psychoeducation, Recreational therapy.   Physician Treatment Plan for Primary Diagnosis: MDD (major depressive disorder) Long Term Goal(s):     Short Term Goals:    Medication Management: Evaluate patient's response, side effects, and  tolerance of medication regimen.  Therapeutic Interventions: 1 to 1 sessions, Unit Group sessions and Medication administration.  Evaluation of Outcomes: Not Progressing  Physician Treatment Plan for Secondary Diagnosis: Principal Problem:   MDD (major depressive disorder)  Long Term Goal(s):     Short Term Goals:       Medication Management: Evaluate patient's response, side effects, and tolerance of medication regimen.  Therapeutic Interventions: 1 to 1 sessions, Unit Group sessions and Medication administration.  Evaluation of Outcomes: Not Progressing   RN Treatment Plan for Primary Diagnosis: MDD (major depressive disorder) Long Term Goal(s): Knowledge of disease and therapeutic regimen to maintain health will improve  Short Term Goals: Ability to remain free from injury will improve, Ability to verbalize frustration and anger appropriately will improve, Ability to demonstrate self-control, Ability to participate in decision making will improve, Ability to verbalize feelings will improve, Ability to disclose and discuss suicidal ideas, Ability to identify and develop effective coping behaviors will improve, and Compliance with prescribed medications will improve  Medication Management: RN will administer medications as ordered by provider, will assess and evaluate patient's response and provide education to patient for prescribed medication. RN will report any adverse and/or side effects to prescribing provider.  Therapeutic Interventions: 1 on 1 counseling sessions, Psychoeducation, Medication administration, Evaluate responses to treatment, Monitor vital signs and CBGs as ordered, Perform/monitor CIWA, COWS, AIMS and Fall Risk screenings as ordered, Perform wound care treatments as ordered.  Evaluation of Outcomes: Not Progressing  LCSW Treatment Plan for Primary Diagnosis: MDD (major depressive disorder) Long Term Goal(s): Safe transition to appropriate next level of care at  discharge, Engage patient in therapeutic group addressing interpersonal concerns.  Short Term Goals: Engage patient in aftercare planning with referrals and resources, Increase social support, Increase ability to appropriately verbalize feelings, Increase emotional regulation, and Increase skills for wellness and recovery  Therapeutic Interventions: Assess for all discharge needs, 1 to 1 time with Social worker, Explore available resources and support systems, Assess for adequacy in community support network, Educate family and significant other(s) on suicide prevention, Complete Psychosocial Assessment, Interpersonal group therapy.  Evaluation of Outcomes: Not Progressing   Progress in Treatment: Attending groups: Yes. Participating in groups: Yes. Taking medication as prescribed: Yes. Toleration medication: Yes. Family/Significant other contact made: No, will contact:  [facility], father, [phone] Patient understands diagnosis: Yes. Discussing patient identified problems/goals with staff: Yes. Medical problems stabilized or resolved: Yes. Denies suicidal/homicidal ideation: Yes. Issues/concerns per patient self-inventory: No. Other: n/a  New problem(s) identified: No, Describe:  patient did not identify any new problems.   New Short Term/Long Term Goal(s): detox, medication management for mood stabilization; elimination of SI thoughts; development of comprehensive mental wellness/sobriety plan  OR   medication stabilization, elimination of SI thoughts, development of comprehensive mental wellness plan.    Patient Goals:  " I want to be a better mom to my son. I want to work on letting the past go and move forward".   Discharge Plan or Barriers: Patient recently admitted. CSW will continue to follow and assess for appropriate referrals and possible discharge planning.    Reason for Continuation of Hospitalization: Depression  Estimated Length of Stay: 3 to 5 days   Last 3  [place] Suicide Severity Risk Score: Flowsheet Row [facility] (Current[facility]Ad[facility] (Discharged) from 01/12/2023 in [facility] ED from [facility]/11/2023 in [facility]  C-SSRS RISK CATEGORY No Risk No Risk Low Risk       Last PHQ 2/9 Scores:    09/07/2020   11:49 AM 09/04/2020   10:58 AM 08/10/2020   10:04 AM  Depression screen PHQ 2/9  Decreased Interest 2 2 1   Down, Depressed, Hopeless 1 2 2   PHQ - 2 Score 3 4 3   Altered sleeping 0 0 3  Tired, decreased energy 0 1 1  Change in appetite 0 0 0  Feeling bad or failure about yourself  1 3 2   Trouble concentrating 3 3 2   Moving slowly or fidgety/restless 0 0 0  Suicidal thoughts 0 0 0  PHQ-9 Score 7 11 11   Difficult doing work/chores Somewhat difficult Somewhat difficult Somewhat difficult    Scribe for Treatment Team: Paulino Rily 01/15/2023 1:31 PM
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
## 2023-01-15 NOTE — [facility] Group Notes (Signed)
Spiritual care group facilitated by Chaplain Dyanne Carrel, [facility]  Group focused on topic of strength. Group members reflected on what thoughts and feelings emerge when they hear this topic. They then engaged in facilitated dialog around how strength is present in their lives. This dialog focused on representing what strength had been to them in their lives (images and patterns given) and what they saw as helpful in their life now (what they needed / wanted).  Activity drew on narrative framework.  Patient Progress: Kristen Padilla attended group and actively participated and engaged in group conversation and activities.  She shared about having the strength to leave an toxic relationship with the father of her baby in order to be the parent that she wanted to be.  She shared that she needs strength now to be able to set boundaries with her mother and sister.

## 2023-01-16 NOTE — [facility] Group Notes (Signed)
Adult Psychoeducational Group Note  Date:  01/16/2023 Time:  9:29 AM  Group Topic/Focus:  Goals Group:   The focus of this group is to help patients establish daily goals to achieve during treatment and discuss how the patient can incorporate goal setting into their daily lives to aide in recovery. Orientation:   The focus of this group is to educate the patient on the purpose and policies of crisis stabilization and provide a format to answer questions about their admission.  The group details unit policies and expectations of patients while admitted.  Participation Level:  Active  Participation Quality:  Appropriate  Affect:  Appropriate  Cognitive:  Appropriate  Insight: Appropriate  Engagement in Group:  Engaged  Modes of Intervention:  Discussion  Additional Comments:  Pt attended the goals group and remained appropriate and engaged throughout the duration of the group.   Sheran Lawless 01/16/2023, 9:29 AM

## 2023-01-16 NOTE — [facility] Suicide Risk Assessment (Signed)
[facility] INPATIENT:  Family/Significant Other Suicide Prevention Education  Suicide Prevention Education:  Education Completed; 01-16-2023,  Kristen Padilla friend [phone]  has been identified by the patient as the family member/significant other with whom the patient will be residing, and identified as the person(s) who will aid the patient in the event of a mental health crisis (suicidal ideations/suicide attempt).  With written consent from the patient, the family member/significant other has been provided the following suicide prevention education, prior to the and/or following the discharge of the patient.  The suicide prevention education provided includes the following: Suicide risk factors Suicide prevention and interventions National Suicide Hotline telephone number [facility] assessment telephone number [facility] Emergency Assistance 911 [facility] and/or Residential Mobile Crisis Unit telephone number  Request made of family/significant other to: Remove weapons (e.g., guns, rifles, knives), all items previously/currently identified as safety concern.   Remove drugs/medications (over-the-counter, prescriptions, illicit drugs), all items previously/currently identified as a safety concern.  Kristen Padilla friend [phone] verbalizes understanding of the suicide prevention education information provided.  The family member/significant other agrees to remove the items of safety concern listed above.  Kristen Padilla S Kristen Padilla 01/16/2023, 10:00 AM

## 2023-01-16 NOTE — Group Note (Signed)
Date:  01/16/2023 Time:  12:18 PM                                                 [facility] LCSW Group Therapy Note    Group Date: @GROUPDATE @ Start Time: @GROUPSTARTTIME @ End Time: @GROUPENDTIME @  Type of Therapy and Topic:  Group Therapy:  Overcoming Obstacles  Participation Level:    Mood:  Description of Group:   In this group patients will be encouraged to explore what they see as obstacles to their own wellness and recovery. They will be guided to discuss their thoughts, feelings, and behaviors related to these obstacles. The group will process together ways to cope with barriers, with attention given to specific choices patients can make. Each patient will be challenged to identify changes they are motivated to make in order to overcome their obstacles. This group will be process-oriented, with patients participating in exploration of their own experiences as well as giving and receiving support and challenge from other group members.  Therapeutic Goals: 1. Patient will identify personal and current obstacles as they relate to admission. 2. Patient will identify barriers that currently interfere with their wellness or overcoming obstacles.  3. Patient will identify feelings, thought process and behaviors related to these barriers. 4. Patient will identify two changes they are willing to make to overcome these obstacles:    Summary of Patient Progress      Therapeutic Modalities:   Cognitive Behavioral Therapy Solution Focused Therapy Motivational Interviewing Relapse Prevention Therapy   Kristen Momon M ChriscoGroup Topic/Focus:  Developing a Wellness Toolbox:   The focus of this group is to help patients develop a "wellness toolbox" with skills and strategies to promote recovery upon discharge. Wellness Toolbox:   The focus of this group is to discuss various aspects of wellness, balancing those aspects and exploring ways to increase the ability to experience wellness.  Patients  will create a wellness toolbox for use upon discharge.    Participation Level:  Active  Participation Quality:  Appropriate  Affect:  Appropriate  Cognitive:  Appropriate  Insight: Appropriate  Engagement in Group:  Engaged  Modes of Intervention:  Discussion, Exploration, and Support  Additional Comments:    [name] 01/16/2023, 12:18 PM

## 2023-01-16 NOTE — [facility] Group Notes (Signed)
Adult Psychoeducational Group Note  Date:  01/16/2023 Time:  8:28 PM  Group Topic/Focus:  Wrap-Up Group:   The focus of this group is to help patients review their daily goal of treatment and discuss progress on daily workbooks.  Participation Level:  Active  Participation Quality:  Appropriate  Affect:  Appropriate  Cognitive:  Appropriate  Insight: Appropriate  Engagement in Group:  Engaged  Modes of Intervention:  Discussion  Additional Comments:  Maridel said her color is orange and she would paint the country [place] orange  Charna Busman Long 01/16/2023, 8:28 PM

## 2023-01-16 NOTE — Progress Notes (Addendum)
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   Case was discussed in the multidisciplinary team. MAR was reviewed and patient received PRN hydroxyzine 25mg  once at bedtime. No concerns by nursing staff.  Psychiatric Team made the following recommendations yesterday: Started Ativan 2 mg as needed every 6 hours for seizure   Today on interview, pt reports that her only concern is that she wants to get out tomorrow so she can see her baby before she has to give back to her ex-husband (her weekly custody is Tuesday until Friday at 7 PM).  She reports that her sleep is fine and that she went to bed earlier than usual (9 PM) and slept until they wake people in the morning, she is more sleep than she usually gets and she felt good.  Regarding her appetite she reports that she is eating okay and she normally does not eat a lot at baseline because she has a gastric sleeve.  She denies all symptoms of depression, SI, HI, AVH.  I asked about her discharge plan and she says she will go to her friend Angela's house and stay there for several weeks.  Another plan she has when she gets out is to change her phone number so that her ex cannot contact her.  She says that she has been to [facility] for therapy services in the past and enjoys them.  Wants to pursue individual and group therapy.  Provided the  patient education around psychiatric medications that would beneficial her case.   Total time spent with patient: 15 minutes  Past psychiatric history: MDD and peripartum depression, no prior psychiatric hospitalizations, and 2 suicide attempts by intentional overdose (ibuprofen in 2014, tylenol and NyQuil in 2021)   Past Medical History:  Past Medical History:  Diagnosis Date   Anxiety, generalized    Depression    Miscarriage    Morbid obesity (HCC)    Suicidal behavior with attempted self-injury (HCC) 2014   ibuprofen OD   Suicidal behavior with attempted self-injury (HCC) 2022   tylenol and NyQuil OD   UTI (urinary tract infection)     Past Surgical History:  Procedure Laterality Date   CESAREAN SECTION N/A 06/29/2022   Procedure: CESAREAN SECTION;  Surgeon: Hoover Browns, MD;  Location: [facility] LD ORS;  Service: Obstetrics;  Laterality: N/A;   HERNIA REPAIR     LAPAROSCOPIC GASTRIC SLEEVE RESECTION     Repair of displaced fracture of the left radius  03/2020   Family History:  Family History  Problem Relation Age of Onset   Diabetes Mother    Hypertension Mother    Depression Mother    Hypertension Father    Family Psychiatric History: none reported  Social History:  Social History   Substance and Sexual Activity  Alcohol Use No     Social History   Substance and Sexual Activity  Drug Use No    Social History   Socioeconomic History   Marital status:  Married    Spouse name: Not on file   Number of children: 1   Years of education: Not on file   Highest education level: High school graduate  Occupational History   Not on file  Tobacco Use   Smoking status: Never   Smokeless tobacco: Never  Vaping Use   Vaping status: Never Used  Substance and Sexual Activity   Alcohol use: No   Drug use: No   Sexual activity: Not Currently    Birth control/protection: None  Other Topics Concern   Not on file  Social History Narrative   Not on file   Social  Determinants of Health   Financial Resource Strain: Low Risk  (06/05/2020)   Overall Financial Resource Strain (CARDIA)    Difficulty of Paying Living Expenses: Not very hard  Food Insecurity: No Food Insecurity (01/14/2023)   Hunger Vital Sign    Worried About Running Out of Food in the Last Year: Never true    Ran Out of Food in the Last Year: Never true  Transportation Needs: No Transportation Needs (01/14/2023)   PRAPARE - Administrator, Civil Service (Medical): No    Lack of Transportation (Non-Medical): No  Physical Activity: Insufficiently Active (08/10/2020)   Exercise Vital Sign    Days of Exercise per Week: 2 days    Minutes of Exercise per Session: 50 min  Stress: Stress Concern Present (06/05/2020)   [employer] of Occupational Health - Occupational Stress Questionnaire    Feeling of Stress : Very much  Social Connections: Unknown (11/11/2021)   Received from [facility]   Social Network    Social Network: Not on file    Current Medications: Current Facility-Administered Medications  Medication Dose Route Frequency Provider Last Rate Last Admin   acetaminophen (TYLENOL) tablet 650 mg  650 mg Oral Q6H PRN Carrion-Carrero, Margely, MD       diphenhydrAMINE (BENADRYL) capsule 50 mg  50 mg Oral TID PRN Carrion-Carrero, Margely, MD       Or   diphenhydrAMINE (BENADRYL) injection 50 mg  50 mg Intramuscular TID PRN Carrion-Carrero, Margely, MD       haloperidol (HALDOL) tablet 5 mg  5 mg Oral TID PRN Carrion-Carrero, Margely, MD       Or   haloperidol lactate (HALDOL) injection 5 mg  5 mg Intramuscular TID PRN Carrion-Carrero, Margely, MD       hydrOXYzine (ATARAX) tablet 25 mg  25 mg Oral TID PRN Carrion-Carrero, Karle Starch, MD   25 mg at 01/14/23 2105   LORazepam (ATIVAN) tablet 2 mg  2 mg Oral TID PRN Carrion-Carrero, Karle Starch, MD       Or   LORazepam (ATIVAN) injection 2 mg  2 mg Intramuscular TID PRN Carrion-Carrero, Margely, MD        LORazepam (ATIVAN) tablet 2 mg  2 mg Oral Q6H PRN Meryl Dare, MD       Or   LORazepam (ATIVAN) injection 2 mg  2 mg Intramuscular Q6H PRN Meryl Dare, MD       traZODone (DESYREL) tablet 50 mg  50 mg Oral QHS PRN Massengill, Harrold Donath, MD        Lab Results:  Results for orders placed or performed during the hospital encounter of 01/14/23 (from the past 48 hour(s))  TSH     Status: None   Collection Time: 01/15/23  6:56 AM  Result Value Ref Range   TSH 1.348 0.350 - 4.500 uIU/mL  Comment: Performed by a 3rd Generation assay with a functional sensitivity of <=0.01 uIU/mL. Performed at [facility], [address], [place], [place] [address]     Blood Alcohol level:  Lab Results  Component Value Date   ETH <10 01/12/2023   ETH <10 01/11/2023    Metabolic Disorder Labs: Lab Results  Component Value Date   HGBA1C 5.2 06/17/2022   MPG 103 06/17/2022   No results found for: "PROLACTIN" No results found for: "CHOL", "TRIG", "HDL", "CHOLHDL", "VLDL", "LDLCALC"  Physical Findings:  Musculoskeletal: Strength & Muscle Tone: within normal limits Gait & Station: normal Patient leans: N/A  Psychiatric Specialty Exam:  Presentation  General Appearance:   Appropriate for Environment   Eye Contact:  Good  Speech:  Normal Rate  Speech Volume:  Normal  Handedness:  Right    Mood and Affect  Mood:  Euthymic  Affect:  Congruent   Thought Process  Thought Processes:  Linear  Descriptions of Associations: Intact  Orientation: Full (Time, Place and Person)  Thought Content: Logical  History of Schizophrenia/Schizoaffective disorder: No data recorded Duration of Psychotic Symptoms: No data recorded Hallucinations: Hallucinations: None  Ideas of Reference: None  Suicidal Thoughts: Suicidal Thoughts: No  Homicidal Thoughts: Homicidal Thoughts: No    Sensorium  Memory: Immediate Good; Recent Good; Remote Good   Judgment:   Fair   Insight:  Fair    Art therapist  Concentration:  Good  Attention Span:  Good  Recall:  Good  Fund of Knowledge:  Good  Language:  Good  Psychomotor Activity  Psychomotor Activity:  Psychomotor Activity: Normal   Physical Exam: Physical Exam Vitals and nursing note reviewed.  Pulmonary:     Effort: Pulmonary effort is normal.  Neurological:     General: No focal deficit present.     Mental Status: She is alert.     Review of Systems  Constitutional:  Negative for fever.  Cardiovascular:  Negative for chest pain and palpitations.  Gastrointestinal:  Negative for constipation, diarrhea, nausea and vomiting.  Neurological:  Negative for dizziness, weakness and headaches.    Blood pressure 102/72, pulse 90, temperature 98.3 F (36.8 C), temperature source Oral, resp. rate 20, height 5\' 2"  (1.575 m), weight 87.6 kg, last menstrual period 01/08/2023, SpO2 100%, not currently breastfeeding. Body mass index is 35.34 kg/m.   Treatment Plan   ASSESSMENT:   Diagnoses / Active Problems:  - Adjustment disorder with depressed mood - r/o MDD (major depressive disorder) - r/o GAD   Caterine is a 32 year old female with past psychiatric history of MDD, postpartum depression, past suicide attempt in 2021 who presents with intentional overdose but denying suicidal intention.  Patient only describes depressed mood and does not meet criteria for major depressive episode.  In the context of her recent separation from ex-husband 3 months ago, reported depressed mood, behavioral changes (e.g. anger outburst), impulsive overdose without suicidal ideation, and general disproportionate level of distress, she meets criteria for adjustment disorder with depressed mood.  We will continue assessed the patient to rule out MDD and GAD.  Patient has anemia but unlikely to explain her current presentation given lack of issues around her energy.  Patient does not want to start  psychotropic medications at this time but will provide education on potential benefits and risks of medication.  Patient reports positive response to individual and group in the past, so we encouraged the patient to participate in groups here and will establish care with therapy  outpatient which should include individual and group sessions.  Risk for suicide include previous attempt in 2021.  Protective factors include patient denying suicidal ideation, good social support, young child care for.    7/18: She continues to deny symptoms of depression and suicidal thoughts.  Today she is future oriented and considering ways she can fix the stressors in her life.  Discharged planned for Saturday morning with her friend [name].  Educated on the role of medications but does not want to start any this hospitalization.   PLAN: Safety and Monitoring:             -- Involuntary admission to inpatient psychiatric unit for safety, stabilization and treatment             -- Daily contact with patient to assess and evaluate symptoms and progress in treatment             -- Patient's case to be discussed in multi-disciplinary team meeting             -- Observation Level : q15 minute checks             -- Vital signs:  q12 hours             -- Precautions: suicide, elopement, and assault     2. Psychiatric Diagnoses and Treatment:              -- Continue Ativan 2 mg as needed every 6 hours for seizure             -- Short Term Goals: Ability to identify changes in lifestyle to reduce recurrence of condition will improve, Ability to verbalize feelings will improve, Ability to disclose and discuss suicidal ideas, Ability to demonstrate self-control will improve, Ability to identify and develop effective coping behaviors will improve, Ability to maintain clinical measurements within normal limits will improve, Compliance with prescribed medications will improve, and Ability to identify triggers associated with  substance abuse/mental health issues will improve             -- Long Term Goals: Improvement in symptoms so as ready for discharge               3. Medical Issues Being Addressed:              -- Anemia: hemoglobin 10.0 on 7/15.  May pursue further testing.  No treatment indicated at this time.     4. Routine and other pertinent labs reviewed: EKG monitoring: QTc: 407   BMI: Body mass index is 35.34 kg/m.   HbgA1c: 5.2  TSH: 1.348   Labs to order: None currently     5. Discharge Planning:              -- Social work and case management to assist with discharge planning and identification of hospital follow-up needs prior to discharge             -- Estimated LOS: 3d. 7/20 expected             -- Discharge Concerns: Need to establish a safety plan; Medication compliance and effectiveness             -- Discharge Goals: Return home with outpatient referrals for mental health follow-up including medication management/psychotherapy   Meryl Dare, MD 01/16/2023, 2:57 PM

## 2023-01-16 NOTE — Progress Notes (Signed)
   01/16/23 0900  Psych Admission Type (Psych Patients Only)  Admission Status Involuntary  Psychosocial Assessment  Patient Complaints None  Eye Contact Fair  Facial Expression Masked;Anxious  Affect Appropriate to circumstance  Speech Logical/coherent  Interaction Guarded  Motor Activity Other (Comment) (wnl)  Appearance/Hygiene Unremarkable  Behavior Characteristics Appropriate to situation  Mood Apprehensive  Thought Process  Coherency WDL  Content WDL  Delusions None reported or observed  Perception WDL  Hallucination None reported or observed  Judgment Poor  Confusion WDL  Danger to Self  Current suicidal ideation? Denies  Danger to Others  Danger to Others None reported or observed

## 2023-01-16 NOTE — Plan of Care (Signed)
°  Problem: Education: °Goal: Emotional status will improve °Outcome: Progressing °Goal: Mental status will improve °Outcome: Progressing °  °Problem: Activity: °Goal: Interest or engagement in activities will improve °Outcome: Progressing °  °

## 2023-01-16 NOTE — Progress Notes (Signed)
   01/15/23 2300  Psych Admission Type (Psych Patients Only)  Admission Status Involuntary  Psychosocial Assessment  Patient Complaints None  Eye Contact Fair  Facial Expression Anxious  Affect Appropriate to circumstance  Speech Logical/coherent  Interaction Assertive  Motor Activity Slow  Appearance/Hygiene Improved  Behavior Characteristics Appropriate to situation  Mood Anxious  Thought Process  Coherency WDL  Content WDL  Delusions None reported or observed  Perception WDL  Hallucination None reported or observed  Judgment Poor  Confusion None  Danger to Self  Current suicidal ideation? Denies  Danger to Others  Danger to Others None reported or observed

## 2023-01-16 NOTE — Plan of Care (Signed)
  Problem: Education: Goal: Verbalization of understanding the information provided will improve Outcome: Progressing   Problem: Activity: Goal: Interest or engagement in activities will improve Outcome: Progressing   Problem: Coping: Goal: Ability to verbalize frustrations and anger appropriately will improve Outcome: Progressing   Problem: Education: Goal: Verbalization of understanding the information provided will improve Outcome: Progressing   Problem: Activity: Goal: Interest or engagement in activities will improve Outcome: Progressing   Problem: Coping: Goal: Ability to verbalize frustrations and anger appropriately will improve Outcome: Progressing

## 2023-01-17 NOTE — Progress Notes (Signed)
Patient received alert and oriented. Oriented to staff  and milieu. Denies SI/HI/AVH, anxiety and depression.   Denies pain. Patient states she had a good day.  Encouraged to drink fluids. Participates in group. Patient encouraged to come to staff with needs and problems.  Denies any needs at this time.  01/17/23 2120  Psychosocial Assessment  Patient Complaints None  Eye Contact Fair  Facial Expression Flat  Affect Flat  Speech Logical/coherent  Interaction Minimal  Motor Activity Slow  Appearance/Hygiene Unremarkable  Behavior Characteristics Cooperative  Mood Depressed  Thought Process  Coherency WDL  Content WDL  Delusions WDL  Perception WDL  Hallucination None reported or observed  Judgment WDL  Confusion WDL  Danger to Self  Current suicidal ideation? Denies  Danger to Others  Danger to Others None reported or observed
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
## 2023-01-17 NOTE — Discharge Instructions (Signed)
-  Follow-up with your outpatient psychiatric provider if you wish to start medications at any point.   -Look at social work information for your partial hospitalization program info starting on 7/22.   -Follow-up with outpatient primary care doctor and other specialists -for management of preventative medicine and any chronic medical disease.  -Recommend abstinence from alcohol, tobacco, and other illicit drug use at discharge.   -If your psychiatric symptoms recur, worsen, or if you have side effects to your psychiatric medications, call your outpatient psychiatric provider, 911, [phone] or go to the nearest emergency department.  -If suicidal thoughts occur, call your outpatient psychiatric provider, 911, 988 or go to the nearest emergency department.  Naloxone (Narcan) can help reverse an overdose when given to the victim quickly.  [facility] offers free naloxone kits and instructions/training on its use.  Add naloxone to your first aid kit and you can help save a life.   Pick up your free kit at the following locations:   [place]:  [facility], [address] [place] [address] [phone]) [facility] [address] [place] [id number] [phone]) [facility] [address] [place] [address]  [place]: [facility] [address] [place] [address] ([phone]) [facility] [address] [place] [address] [phone])

## 2023-01-17 NOTE — Plan of Care (Signed)

## 2023-01-17 NOTE — Group Note (Signed)
Recreation Therapy Group Note   Group Topic:Communication  Group Date: 01/17/2023 Start Time: 1000 End Time: 1025 Facilitators: Mckaylah Bettendorf-McCall, LRT,CTRS Location: [address] Dayroom   Goal Area(s) Addresses:  Patient will effectively listen to complete activity.  Patient will identify communication skills used to make activity successful.  Patient will identify how skills used during activity can be used to reach post d/c goals.    Group Description: Geometric Drawings.  Three volunteers from the peer group will be shown an abstract picture with a particular arrangement of geometrical shapes.  Each round, one 'speaker' will describe the pattern, as accurately as possible without revealing the image to the group.  The remaining group members will listen and draw the picture to reflect how it is described to them. Patients with the role of 'listener' cannot ask clarifying questions but, may request that the speaker repeat a direction. Once the drawings are complete, the presenter will show the rest of the group the picture and compare how close each person came to drawing the picture. LRT will facilitate a post-activity discussion regarding effective communication and the importance of planning, listening, and asking for clarification in daily interactions with others.   Affect/Mood: Appropriate   Participation Level: Engaged   Participation Quality: Independent   Behavior: Appropriate   Speech/Thought Process: Focused   Insight: Good   Judgement: Good   Modes of Intervention: Activity   Patient Response to Interventions:  Engaged   Education Outcome:  Acknowledges education   Clinical Observations/Individualized Feedback: Pt was bright and engaged during group session. Pt did show some frustration in trying to interpret what the presenters were saying. Pt was eventually called out of group and returned at the end of the activity.    Plan: Continue to engage patient in  RT group sessions 2-3x/week.   Kristen Padilla, LRT,CTRS  01/17/2023 12:10 PM

## 2023-01-17 NOTE — Progress Notes (Signed)
   01/16/23 2209  Psych Admission Type (Psych Patients Only)  Admission Status Involuntary  Psychosocial Assessment  Patient Complaints None  Eye Contact Fair  Facial Expression Flat  Affect Appropriate to circumstance  Speech Logical/coherent  Interaction Assertive  Motor Activity Slow  Appearance/Hygiene Unremarkable  Behavior Characteristics Cooperative  Mood Depressed  Thought Process  Coherency WDL  Content Blaming others  Delusions WDL  Perception WDL  Hallucination None reported or observed  Judgment Poor  Confusion WDL  Danger to Self  Current suicidal ideation? Denies  Danger to Others  Danger to Others None reported or observed

## 2023-01-17 NOTE — Progress Notes (Signed)
   01/17/23 1100  Psych Admission Type (Psych Patients Only)  Admission Status Involuntary  Psychosocial Assessment  Patient Complaints None  Eye Contact Fair  Facial Expression Flat  Affect Appropriate to circumstance  Speech Logical/coherent  Interaction Assertive  Motor Activity Slow  Appearance/Hygiene Unremarkable  Behavior Characteristics Cooperative  Mood Depressed  Thought Process  Coherency WDL  Content WDL  Delusions WDL  Perception WDL  Hallucination None reported or observed  Judgment Poor  Confusion None  Danger to Self  Current suicidal ideation? Denies  Danger to Others  Danger to Others None reported or observed

## 2023-01-17 NOTE — Progress Notes (Signed)
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   Case was discussed in the multidisciplinary team. MAR was reviewed and patient received no PRNs. No concerns by nursing staff and reported sleeping 8 hours per their report.  Psychiatric Team made the following recommendations yesterday: Continued Ativan 2 mg as needed every 6 hours for seizure   Today on interview, pt reports that she wants to get out today to see her kids. We reinforced that we would discharge her tomorrow at 11 if nothing changes. She reports sleeping okay and eating good. She denies thoughts of wanting to harm herself or others. She denies seeing or hearing things that others cannot. She reports that she will be picked and up and stay with her friend [name]. She is still interested in attending PHP and will start that on 7/22.    Total time spent with patient: 15 minutes  Past psychiatric history: MDD and peripartum depression, no prior psychiatric hospitalizations, and 2 suicide attempts by intentional overdose (ibuprofen in 2014, tylenol and NyQuil in 2021)   Past Medical History:  Past Medical History:  Diagnosis Date   Anxiety, generalized    Depression    Miscarriage    Morbid obesity (HCC)    Suicidal behavior with attempted self-injury (HCC) 2014   ibuprofen OD   Suicidal behavior with attempted  self-injury (HCC) 2022   tylenol and NyQuil OD   UTI (urinary tract infection)     Past Surgical History:  Procedure Laterality Date   CESAREAN SECTION N/A 06/29/2022   Procedure: CESAREAN SECTION;  Surgeon: Hoover Browns, MD;  Location: [facility] LD ORS;  Service: Obstetrics;  Laterality: N/A;   HERNIA REPAIR     LAPAROSCOPIC GASTRIC SLEEVE RESECTION     Repair of displaced fracture of the left radius  03/2020   Family History:  Family History  Problem Relation Age of Onset   Diabetes Mother    Hypertension Mother    Depression Mother    Hypertension Father    Family Psychiatric History: none reported  Social History:  Social History   Substance and Sexual Activity  Alcohol Use No     Social History   Substance and Sexual Activity  Drug Use No    Social History   Socioeconomic History   Marital status: Married    Spouse name: Not on file   Number of children: 1   Years of education: Not on file   Highest education level: High school graduate  Occupational History   Not on file  Tobacco Use   Smoking status: Never   Smokeless tobacco: Never  Vaping Use   Vaping status: Never Used  Substance and Sexual Activity   Alcohol use: No   Drug use: No   Sexual activity: Not Currently    Birth control/protection: None  Other Topics Concern   Not on file  Social History Narrative   Not on file   Social Determinants of Corporate investment banker  Strain: Low Risk  (06/05/2020)   Overall Financial Resource Strain (CARDIA)    Difficulty of Paying Living Expenses: Not very hard  Food Insecurity: No Food Insecurity (01/14/2023)   Hunger Vital Sign    Worried About Running Out of Food in the Last Year: Never true    Ran Out of Food in the Last Year: Never true  Transportation Needs: No Transportation Needs (01/14/2023)   PRAPARE - Administrator, Civil Service (Medical): No    Lack of Transportation (Non-Medical): No  Physical Activity: Insufficiently Active  (08/10/2020)   Exercise Vital Sign    Days of Exercise per Week: 2 days    Minutes of Exercise per Session: 50 min  Stress: Stress Concern Present (06/05/2020)   [employer] of Occupational Health - Occupational Stress Questionnaire    Feeling of Stress : Very much  Social Connections: Unknown (11/11/2021)   Received from [facility]   Social Network    Social Network: Not on file    Current Medications: Current Facility-Administered Medications  Medication Dose Route Frequency Provider Last Rate Last Admin   acetaminophen (TYLENOL) tablet 650 mg  650 mg Oral Q6H PRN Carrion-Carrero, Margely, MD       diphenhydrAMINE (BENADRYL) capsule 50 mg  50 mg Oral TID PRN Carrion-Carrero, Margely, MD       Or   diphenhydrAMINE (BENADRYL) injection 50 mg  50 mg Intramuscular TID PRN Carrion-Carrero, Margely, MD       haloperidol (HALDOL) tablet 5 mg  5 mg Oral TID PRN Carrion-Carrero, Margely, MD       Or   haloperidol lactate (HALDOL) injection 5 mg  5 mg Intramuscular TID PRN Carrion-Carrero, Karle Starch, MD       hydrOXYzine (ATARAX) tablet 25 mg  25 mg Oral TID PRN Lorri Frederick, MD   25 mg at 01/14/23 2105   LORazepam (ATIVAN) tablet 2 mg  2 mg Oral TID PRN Carrion-Carrero, Karle Starch, MD       Or   LORazepam (ATIVAN) injection 2 mg  2 mg Intramuscular TID PRN Carrion-Carrero, Margely, MD       LORazepam (ATIVAN) tablet 2 mg  2 mg Oral Q6H PRN Meryl Dare, MD       Or   LORazepam (ATIVAN) injection 2 mg  2 mg Intramuscular Q6H PRN Meryl Dare, MD       traZODone (DESYREL) tablet 50 mg  50 mg Oral QHS PRN Massengill, Nathan, MD        Lab Results:  No results found for this or any previous visit (from the past 48 hour(s)).   Blood Alcohol level:  Lab Results  Component Value Date   ETH <10 01/12/2023   ETH <10 01/11/2023    Metabolic Disorder Labs: Lab Results  Component Value Date   HGBA1C 5.2 06/17/2022   MPG 103 06/17/2022   No results found  for: "PROLACTIN" No results found for: "CHOL", "TRIG", "HDL", "CHOLHDL", "VLDL", "LDLCALC"  Physical Findings:  Musculoskeletal: Strength & Muscle Tone: within normal limits Gait & Station: normal Patient leans: N/A  Psychiatric Specialty Exam:  Presentation  General Appearance:   Appropriate for Environment   Eye Contact:  Good  Speech:  Normal Rate  Speech Volume:  Normal  Handedness:  Right    Mood and Affect  Mood:  Euthymic; Anxious (about when she can leave)  Affect:  Appropriate   Thought Process  Thought Processes:  Coherent  Descriptions of Associations: Intact  Orientation: Full (  Time, Place and Person)  Thought Content: Logical  History of Schizophrenia/Schizoaffective disorder: No data recorded Duration of Psychotic Symptoms: No data recorded Hallucinations: Hallucinations: None  Ideas of Reference: None  Suicidal Thoughts: Suicidal Thoughts: No  Homicidal Thoughts: Homicidal Thoughts: No    Sensorium  Memory: Immediate Good; Recent Good; Remote Good   Judgment:  Fair   Insight:  Good    Executive Functions  Concentration:  Good  Attention Span:  Good  Recall:  Good  Fund of Knowledge:  Good  Language:  Good  Psychomotor Activity  Psychomotor Activity:  Psychomotor Activity: Normal   Physical Exam: Physical Exam Vitals and nursing note reviewed.  Pulmonary:     Effort: Pulmonary effort is normal.  Neurological:     General: No focal deficit present.     Mental Status: She is alert.     Review of Systems  Constitutional:  Negative for fever.  Cardiovascular:  Negative for chest pain and palpitations.  Gastrointestinal:  Negative for constipation, diarrhea, nausea and vomiting.  Neurological:  Negative for dizziness, weakness and headaches.    Blood pressure 100/72, pulse (!) 107, temperature 98.5 F (36.9 C), temperature source Oral, resp. rate 16, height 5\' 2"  (1.575 m), weight 87.6 kg, last  menstrual period 01/08/2023, SpO2 100%, not currently breastfeeding. Body mass index is 35.34 kg/m.   Treatment Plan   ASSESSMENT:   Diagnoses / Active Problems:  - Adjustment disorder with depressed mood - r/o MDD (major depressive disorder) - r/o GAD   Shenoa is a 32 year old female with past psychiatric history of MDD, postpartum depression, past suicide attempt in 2021 who presents with intentional overdose but denying suicidal intention.  Patient only describes depressed mood and does not meet criteria for major depressive episode.  In the context of her recent separation from ex-husband 3 months ago, reported depressed mood, behavioral changes (e.g. anger outburst), impulsive overdose without suicidal ideation, and general disproportionate level of distress, she meets criteria for adjustment disorder with depressed mood.  We will continue assessed the patient to rule out MDD and GAD.  Patient has anemia but unlikely to explain her current presentation given lack of issues around her energy.  Patient does not want to start psychotropic medications at this time but will provide education on potential benefits and risks of medication.  Patient reports positive response to individual and group in the past, so we encouraged the patient to participate in groups here and will establish care with therapy outpatient which should include individual and group sessions.  Risk for suicide include previous attempt in 2021.  Protective factors include patient denying suicidal ideation, good social support, young child care for.    7/18: She continues to deny symptoms of depression and suicidal thoughts.  Today she is future oriented and considering ways she can fix the stressors in her life.  Discharged planned for Saturday morning with her friend [name].  Educated on the role of medications but does not want to start any this hospitalization.  7/19: She continues to deny symptoms of depression, anxiety,  and suicidal thoughts. No concern for any wellbutrin toxicity from overdose 7/14. Plan to discharge tomorrow at 11a with her friend [name]. PHP to start on 7/22.    PLAN: Safety and Monitoring:             -- Involuntary admission to inpatient psychiatric unit for safety, stabilization and treatment             -- Daily contact with  patient to assess and evaluate symptoms and progress in treatment             -- Patient's case to be discussed in multi-disciplinary team meeting             -- Observation Level : q15 minute checks             -- Vital signs:  q12 hours             -- Precautions: suicide, elopement, and assault     2. Psychiatric Diagnoses and Treatment:              -- Continue Ativan 2 mg as needed every 6 hours for seizure             -- Short Term Goals: Ability to identify changes in lifestyle to reduce recurrence of condition will improve, Ability to verbalize feelings will improve, Ability to disclose and discuss suicidal ideas, Ability to demonstrate self-control will improve, Ability to identify and develop effective coping behaviors will improve, Ability to maintain clinical measurements within normal limits will improve, Compliance with prescribed medications will improve, and Ability to identify triggers associated with substance abuse/mental health issues will improve             -- Long Term Goals: Improvement in symptoms so as ready for discharge               3. Medical Issues Being Addressed:              -- Anemia: hemoglobin 10.0 on 7/15.  May pursue further testing.  No treatment indicated at this time.     4. Routine and other pertinent labs reviewed: EKG monitoring: QTc: 407   BMI: Body mass index is 35.34 kg/m.   HbgA1c: 5.2  TSH: 1.348   Labs to order: None currently     5. Discharge Planning:              -- Social work and case management to assist with discharge planning and identification of hospital follow-up needs prior to discharge              -- Estimated LOS: discharge tomorrow.             -- Discharge Concerns: Need to establish a safety plan; Medication compliance and effectiveness             -- Discharge Goals: Return home with outpatient referrals for mental health follow-up including medication management/psychotherapy   Meryl Dare, MD 01/17/2023, 11:56 AM

## 2023-01-18 DIAGNOSIS — F332 Major depressive disorder, recurrent severe without psychotic features: Secondary | ICD-10-CM
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
## 2023-01-18 NOTE — Progress Notes (Signed)
Adult Psychoeducational Group Note  Date:  01/18/2023 Time:  3:46 AM  Group Topic/Focus:  Wrap-Up Group:   The focus of this group is to help patients review their daily goal of treatment and discuss progress on daily workbooks.  Participation Level:  Active  Participation Quality:  Appropriate  Affect:  Appropriate  Cognitive:  Appropriate  Insight: Appropriate  Engagement in Group:  Engaged  Modes of Intervention:  Discussion  Additional Comments:  Pt stated her goal for the day was to have a good day. Pt met goal.  Lucilla Lame 01/18/2023, 3:46 AM

## 2023-01-18 NOTE — Progress Notes (Signed)
   01/18/23 0900  Psych Admission Type (Psych Patients Only)  Admission Status Involuntary  Psychosocial Assessment  Patient Complaints None  Eye Contact Fair  Facial Expression Flat  Affect Appropriate to circumstance  Speech Logical/coherent  Interaction Assertive  Motor Activity Other (Comment) (steady gait)  Appearance/Hygiene Unremarkable  Behavior Characteristics Cooperative;Appropriate to situation  Mood Anxious  Thought Process  Coherency WDL  Content WDL  Delusions WDL  Perception WDL  Hallucination None reported or observed  Judgment WDL  Confusion WDL  Danger to Self  Current suicidal ideation? Denies  Danger to Others  Danger to Others None reported or observed

## 2023-01-18 NOTE — Plan of Care (Signed)
  Problem: Education: Goal: Knowledge of [facility] Education information/materials will improve Outcome: Adequate for Discharge Goal: Emotional status will improve Outcome: Adequate for Discharge Goal: Mental status will improve Outcome: Adequate for Discharge Goal: Verbalization of understanding the information provided will improve Outcome: Adequate for Discharge   Problem: Activity: Goal: Interest or engagement in activities will improve Outcome: Adequate for Discharge Goal: Sleeping patterns will improve Outcome: Adequate for Discharge   Problem: Coping: Goal: Ability to verbalize frustrations and anger appropriately will improve Outcome: Adequate for Discharge Goal: Ability to demonstrate self-control will improve Outcome: Adequate for Discharge   Problem: Coping: Goal: Ability to verbalize frustrations and anger appropriately will improve Outcome: Adequate for Discharge Goal: Ability to demonstrate self-control will improve Outcome: Adequate for Discharge   Problem: Health Behavior/Discharge Planning: Goal: Identification of resources available to assist in meeting health care needs will improve Outcome: Adequate for Discharge

## 2023-01-18 NOTE — Progress Notes (Signed)
  [facility] Adult Case Management Discharge Plan :  Will you be returning to the same living situation after discharge:  Yes,  Pt reported going to stay with her friend [name] At discharge, do you have transportation home?: Yes,  Pt reported that her dad is picking her up Do you have the ability to pay for your medications: Yes,  Pt has [employer] of information consent forms completed and in the chart;  Patient's signature needed at discharge.  Patient to Follow up at:  Follow-up Information     [facility]. Go to.   Specialty: Behavioral Health Why: For fastest service, please go to this provider for an assessment, to obtain therapy and medication management services, on Monday through Friday, arrive by 7:00 am. Contact information: [address] [place] [address] [phone]        [facility] Follow up on 01/20/2023.   Specialty: Behavioral Health Why: You are scheduled for an assessment for the PHP on 01/20/23 @ 1p. This appointment will last approximately one hour and will be virtual via [employer] PHP is virtual group therapy that runs Mon-Fri from 9am-1pm. Please download the [employer] Teams app prior to the appointment. If you need to cancel or reschedule, please call [phone]. Contact information: [address] [place] [address] [phone]                Next level of care provider has access to [facility] Link:no  Safety Planning and Suicide Prevention discussed: Yes,  Completed with friend [name]     Has patient been referred to the Quitline?: Patient does not use tobacco/nicotine products  Patient has been referred for addiction treatment: No known substance use disorder.  Steffanie Dunn, LCSWA 01/18/2023, 11:18 AM

## 2023-01-18 NOTE — Progress Notes (Signed)
  [facility] Adult Case Management Discharge Plan :  Will you be returning to the same living situation after discharge:  Yes,  Pt reported that she will be returning with her friend [name] At discharge, do you have transportation home?: Yes,  Pt reported that her dad will be picking her up Do you have the ability to pay for your medications: Yes,  pt has medicaid [employer] [facility]  Release of information consent forms completed and in the chart;  Patient's signature needed at discharge.  Patient to Follow up at:  Follow-up Information     [facility]. Go to.   Specialty: Behavioral Health Why: For fastest service, please go to this provider for an assessment, to obtain therapy and medication management services, on Monday through Friday, arrive by 7:00 am. Contact information: [address] [place] [address] [phone]        [facility] Follow up on 01/20/2023.   Specialty: Behavioral Health Why: You are scheduled for an assessment for the PHP on 01/20/23 @ 1p. This appointment will last approximately one hour and will be virtual via [employer] PHP is virtual group therapy that runs Mon-Fri from 9am-1pm. Please download the [employer] Teams app prior to the appointment. If you need to cancel or reschedule, please call [phone]. Contact information: [address] [place] [address] [phone]                Next level of care provider has access to [facility] Link:no  Safety Planning and Suicide Prevention discussed: Yes,  Complete with her friend [name]     Has patient been referred to the Quitline?: Patient does not use tobacco/nicotine products  Patient has been referred for addiction treatment: No known substance use disorder.   Steffanie Dunn, LCSWA 01/18/2023, 9:54 AM
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
## 2023-01-18 NOTE — Progress Notes (Signed)
Pt discharged to lobby. Pt was stable and appreciative at that time. All papers were given and valuables returned. Suicide safety plan completed and copy given to patient. Verbal understanding expressed. Denies SI/HI and A/VH. Pt given opportunity to express concerns and ask questions.

## 2023-01-20 ENCOUNTER — Ambulatory Visit (INDEPENDENT_AMBULATORY_CARE_PROVIDER_SITE_OTHER): Payer: Medicaid Other | Admitting: Professional

## 2023-01-20 ENCOUNTER — Telehealth (HOSPITAL_COMMUNITY): Payer: Self-pay | Admitting: Professional

## 2023-01-20 DIAGNOSIS — F332 Major depressive disorder, recurrent severe without psychotic features: Secondary | ICD-10-CM

## 2023-01-22 ENCOUNTER — Telehealth (HOSPITAL_COMMUNITY): Payer: Self-pay | Admitting: Professional

## 2023-01-22 ENCOUNTER — Ambulatory Visit (HOSPITAL_COMMUNITY): Payer: Medicaid Other

## 2023-01-22 ENCOUNTER — Encounter (HOSPITAL_COMMUNITY): Payer: Self-pay

## 2023-01-23 ENCOUNTER — Ambulatory Visit (HOSPITAL_COMMUNITY): Payer: Medicaid Other

## 2023-01-23 ENCOUNTER — Telehealth (HOSPITAL_COMMUNITY): Payer: Self-pay | Admitting: Professional

## 2023-01-23 ENCOUNTER — Encounter (HOSPITAL_COMMUNITY): Payer: Self-pay

## 2023-01-24 ENCOUNTER — Ambulatory Visit (HOSPITAL_COMMUNITY): Payer: Medicaid Other

## 2023-01-24 ENCOUNTER — Encounter (HOSPITAL_COMMUNITY): Payer: Self-pay

## 2023-01-27 ENCOUNTER — Ambulatory Visit (HOSPITAL_COMMUNITY): Payer: Medicaid Other

## 2023-01-27 ENCOUNTER — Telehealth (HOSPITAL_COMMUNITY): Payer: Self-pay | Admitting: Professional

## 2023-01-27 ENCOUNTER — Encounter (HOSPITAL_COMMUNITY): Payer: Self-pay

## 2023-01-28 ENCOUNTER — Ambulatory Visit (HOSPITAL_COMMUNITY): Payer: Medicaid Other

## 2023-01-28 ENCOUNTER — Encounter (HOSPITAL_COMMUNITY): Payer: Self-pay

## 2023-01-29 ENCOUNTER — Ambulatory Visit (HOSPITAL_COMMUNITY): Payer: Medicaid Other

## 2023-01-29 ENCOUNTER — Encounter (HOSPITAL_COMMUNITY): Payer: Self-pay
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
## 2023-01-30 ENCOUNTER — Ambulatory Visit (HOSPITAL_COMMUNITY): Payer: Medicaid Other

## 2023-01-31 ENCOUNTER — Ambulatory Visit (HOSPITAL_COMMUNITY): Payer: Medicaid Other

## 2023-02-03 ENCOUNTER — Ambulatory Visit (HOSPITAL_COMMUNITY): Payer: Medicaid Other

## 2023-02-04 ENCOUNTER — Ambulatory Visit (HOSPITAL_COMMUNITY): Payer: Medicaid Other

## 2023-02-05 ENCOUNTER — Ambulatory Visit (HOSPITAL_COMMUNITY): Payer: Medicaid Other

## 2023-02-06 ENCOUNTER — Ambulatory Visit (HOSPITAL_COMMUNITY): Payer: Medicaid Other

## 2023-02-07 ENCOUNTER — Ambulatory Visit (HOSPITAL_COMMUNITY): Payer: Medicaid Other

## 2023-02-10 ENCOUNTER — Ambulatory Visit (HOSPITAL_COMMUNITY): Payer: Medicaid Other

## 2023-02-11 ENCOUNTER — Encounter (HOSPITAL_COMMUNITY): Payer: Self-pay

## 2023-02-11 ENCOUNTER — Ambulatory Visit (HOSPITAL_COMMUNITY): Payer: Medicaid Other

## 2023-04-04 ENCOUNTER — Ambulatory Visit (HOSPITAL_COMMUNITY)
Admission: EM | Admit: 2023-04-04 | Discharge: 2023-04-04 | Discharge status: Against Medical Advice | Payer: Medicaid Other | Attending: Addiction Medicine | Admitting: Addiction Medicine

## 2023-04-04 ENCOUNTER — Other Ambulatory Visit: Payer: Self-pay

## 2023-04-04 ENCOUNTER — Ambulatory Visit (HOSPITAL_COMMUNITY)
Admission: EM | Admit: 2023-04-04 | Discharge: 2023-04-05 | Disposition: A | Discharge status: Home / Self Care | Payer: Medicaid Other | Attending: Addiction Medicine | Admitting: Addiction Medicine

## 2023-04-04 DIAGNOSIS — F4329 Adjustment disorder with other symptoms: Secondary | ICD-10-CM

## 2023-04-04 DIAGNOSIS — F4320 Adjustment disorder, unspecified: Secondary | ICD-10-CM | POA: Insufficient documentation

## 2023-04-04 DIAGNOSIS — F411 Generalized anxiety disorder: Secondary | ICD-10-CM | POA: Insufficient documentation

## 2023-04-04 DIAGNOSIS — Z5321 Procedure and treatment not carried out due to patient leaving prior to being seen by health care provider: Secondary | ICD-10-CM | POA: Diagnosis not present

## 2023-04-04 DIAGNOSIS — F32A Depression, unspecified: Secondary | ICD-10-CM | POA: Insufficient documentation

## 2023-04-04 LAB — HEMOGLOBIN A1C
Hgb A1c MFr Bld: 5.3 % (ref 4.8–5.6)
Mean Plasma Glucose: 105.41 mg/dL

## 2023-04-04 LAB — POCT URINE DRUG SCREEN - MANUAL ENTRY (I-SCREEN)
POC Amphetamine UR: NOT DETECTED
POC Buprenorphine (BUP): NOT DETECTED
POC Cocaine UR: NOT DETECTED
POC Marijuana UR: NOT DETECTED
POC Methadone UR: NOT DETECTED
POC Methamphetamine UR: NOT DETECTED
POC Morphine: NOT DETECTED
POC Oxazepam (BZO): NOT DETECTED
POC Oxycodone UR: NOT DETECTED
POC Secobarbital (BAR): NOT DETECTED

## 2023-04-04 LAB — CBC WITH DIFFERENTIAL/PLATELET
Abs Immature Granulocytes: 0.03 10*3/uL (ref 0.00–0.07)
Basophils Absolute: 0.1 10*3/uL (ref 0.0–0.1)
Basophils Relative: 1 %
Eosinophils Absolute: 0.1 10*3/uL (ref 0.0–0.5)
Eosinophils Relative: 1 %
HCT: 34.4 % — ABNORMAL LOW (ref 36.0–46.0)
Hemoglobin: 10.6 g/dL — ABNORMAL LOW (ref 12.0–15.0)
Immature Granulocytes: 0 %
Lymphocytes Relative: 18 %
Lymphs Abs: 1.7 10*3/uL (ref 0.7–4.0)
MCH: 23.1 pg — ABNORMAL LOW (ref 26.0–34.0)
MCHC: 30.8 g/dL (ref 30.0–36.0)
MCV: 74.9 fL — ABNORMAL LOW (ref 80.0–100.0)
Monocytes Absolute: 0.5 10*3/uL (ref 0.1–1.0)
Monocytes Relative: 5 %
Neutro Abs: 7.4 10*3/uL (ref 1.7–7.7)
Neutrophils Relative %: 75 %
Platelets: 464 10*3/uL — ABNORMAL HIGH (ref 150–400)
RBC: 4.59 MIL/uL (ref 3.87–5.11)
RDW: 15.6 % — ABNORMAL HIGH (ref 11.5–15.5)
WBC: 9.8 10*3/uL (ref 4.0–10.5)
nRBC: 0 % (ref 0.0–0.2)

## 2023-04-04 LAB — COMPREHENSIVE METABOLIC PANEL
ALT: 11 U/L (ref 0–44)
AST: 15 U/L (ref 15–41)
Albumin: 3.9 g/dL (ref 3.5–5.0)
Alkaline Phosphatase: 61 U/L (ref 38–126)
Anion gap: 9 (ref 5–15)
BUN: 11 mg/dL (ref 6–20)
CO2: 25 mmol/L (ref 22–32)
Calcium: 9.2 mg/dL (ref 8.9–10.3)
Chloride: 106 mmol/L (ref 98–111)
Creatinine, Ser: 0.8 mg/dL (ref 0.44–1.00)
GFR, Estimated: >60 mL/min (ref >60)
Glucose, Bld: 86 mg/dL (ref 70–99)
Potassium: 4 mmol/L (ref 3.5–5.1)
Sodium: 140 mmol/L (ref 135–145)
Total Bilirubin: 0.3 mg/dL (ref 0.3–1.2)
Total Protein: 7.2 g/dL (ref 6.5–8.1)

## 2023-04-04 LAB — TSH: TSH: 0.638 u[IU]/mL (ref 0.350–4.500)

## 2023-04-04 LAB — URINALYSIS, ROUTINE W REFLEX MICROSCOPIC
Bilirubin Urine: NEGATIVE
Glucose, UA: NEGATIVE mg/dL
Hgb urine dipstick: NEGATIVE
Ketones, ur: 5 mg/dL — AB
Leukocytes,Ua: NEGATIVE
Nitrite: NEGATIVE
Protein, ur: NEGATIVE mg/dL
Specific Gravity, Urine: 1.028 (ref 1.005–1.030)
pH: 5 (ref 5.0–8.0)

## 2023-04-04 LAB — POC URINE PREG, ED: Preg Test, Ur: NEGATIVE

## 2023-04-04 LAB — MAGNESIUM: Magnesium: 2.3 mg/dL (ref 1.7–2.4)

## 2023-04-04 LAB — ETHANOL: Alcohol, Ethyl (B): <10 mg/dL (ref <10)

## 2023-04-04 MED ORDER — TRAZODONE HCL 50 MG PO TABS: 50.0000 mg | ORAL_TABLET | Freq: Every evening | ORAL | Status: DC | PRN

## 2023-04-04 MED ORDER — ACETAMINOPHEN 325 MG PO TABS: 650.0000 mg | ORAL_TABLET | Freq: Four times a day (QID) | ORAL | Status: DC | PRN

## 2023-04-04 MED ORDER — CLONAZEPAM 0.5 MG PO TABS: 0.5000 mg | ORAL_TABLET | Freq: Three times a day (TID) | ORAL | Status: DC | PRN

## 2023-04-04 MED ORDER — HYDROXYZINE HCL 25 MG PO TABS: 25.0000 mg | ORAL_TABLET | Freq: Three times a day (TID) | ORAL | Status: DC | PRN

## 2023-04-04 NOTE — BH Assessment (Signed)
Received a callback from Pia Mau with CPS. I gave her contact information for IVC'd pt and petitioner (spouse) which was on the IVC. I read her the statement typed on the IVC Affidavit and Petition for Involuntary Commitment. (Written in an earlier note)
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
## 2023-04-04 NOTE — ED Notes (Signed)
Pt sleeping@this time breathing even and unlabored will continue to monitor for safety 
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
## 2023-04-05 NOTE — ED Provider Notes (Signed)
FBC/OBS ASAP Discharge Summary  Date and Time: 04/05/2023 10:47 AM  Name: Kristen Padilla  MRN:  [id number]   Discharge Diagnoses:  Final diagnoses:  Adjustment disorder with disturbance of emotion    Subjective: ***  Stay Summary:     Suicide Risk Assessment Demographic Factors:  {Demographic Factors:20662}  Loss Factors: {Loss Factors:20659}  Historical Factors: {Historical Factors:20660}  Risk Reduction Factors:   {Risk Reduction Factors:20661}  Continued Clinical Symptoms:  {Clinical Factors:22706}  Cognitive Features That Contribute To Risk:  {chl bhh Cognitive Features:304700251}    Suicide Risk:  {BHH SUICIDE RISK:22704}  Rescind IVC After thorough evaluation and review of information currently presented on assessment of Kristen Padilla (respondent), there is insufficient findings to indicate respondent meets criteria for involuntary commitment or require an inpatient level of care.  Respondent is alert/oriented x 4; calm/cooperative; and mood congruent with affect.  Respondent is speaking in a clear tone at moderate volume, and normal pace, with good eye contact.  Respondents' thought process is coherent and relevant; There is no indication that the respondent is currently responding to internal/external stimuli or experiencing delusional thought content; and respondent has denied suicidal/self-harm/homicidal ideation, psychosis, and paranoia.  Respondent has remained calm throughout assessment and has answered questions appropriately.  Currently respondent is not significantly impaired, psychotic, or manic on exam.  A detailed risk assessment has been completed based on clinical exam and individual risk factors.  There is no evidence of imminent risk to self or others at present and respondent does not meet criteria for psychiatric inpatient admission.   CPS casework   Total Time spent with patient: {Time; 15 min - 8 hours:17441}  Past  Psychiatric History: *** Past Medical History: *** Family History: *** Family Psychiatric History: *** Social History: *** Tobacco Cessation:  {Discharge tobacco cessation prescription:304700209}  Current Medications:  No current facility-administered medications for this encounter.   No current outpatient medications on file.    PTA Medications:      01/20/2023    1:37 PM 09/07/2020   11:49 AM 09/04/2020   10:58 AM  Depression screen PHQ 2/9  Decreased Interest 2 2 2   Down, Depressed, Hopeless 1 1 2   PHQ - 2 Score 3 3 4   Altered sleeping 0 0 0  Tired, decreased energy 0 0 1  Change in appetite 0 0 0  Feeling bad or failure about yourself  0 1 3  Trouble concentrating 0 3 3  Moving slowly or fidgety/restless 0 0 0  Suicidal thoughts 1 0 0  PHQ-9 Score 4 7 11   Difficult doing work/chores Not difficult at all Somewhat difficult Somewhat difficult    Flowsheet Row ED from 04/04/2023 in [facility] Most recent reading at 04/04/2023  8:09 PM ED from 04/04/2023 in [facility] Most recent reading at 04/04/2023  2:32 PM Counselor from 01/20/2023 in [facility] Most recent reading at 01/20/2023  1:37 PM  C-SSRS RISK CATEGORY No Risk No Risk High Risk       Musculoskeletal  Strength & Muscle Tone: {desc; muscle tone:32375} Gait & Station: {PE GAIT ED WUJW:11914} Patient leans: {Patient Leans:21022755}  Psychiatric Specialty Exam  Presentation  General Appearance:  Appropriate for Environment  Eye Contact: Good  Speech: Clear and Coherent  Speech Volume: Normal  Handedness: Right   Mood and Affect  Mood: Anxious; Irritable  Affect: Congruent   Thought Process  Thought Processes: Coherent  Descriptions of Associations:Circumstantial  Orientation:Full (Time, Place and Person)  Thought Content:Scattered  Diagnosis of Schizophrenia or Schizoaffective disorder in past: No     Hallucinations:Hallucinations: None  Ideas of Reference:None  Suicidal Thoughts:Suicidal Thoughts: No  Homicidal Thoughts:Homicidal Thoughts: No   Sensorium  Memory: Immediate Good; Recent Good  Judgment: Fair  Insight: Lacking   Executive Functions  Concentration: Fair  Attention Span: Fair  Recall: Fair  Fund of Knowledge: Good  Language: Good   Psychomotor Activity  Psychomotor Activity: Psychomotor Activity: Normal   Assets  Assets: Communication Skills; Physical Health   Sleep  Sleep: Sleep: Good Number of Hours of Sleep: 8   Nutritional Assessment (For OBS and FBC admissions only) Has the patient had a weight loss or gain of 10 pounds or more in the last 3 months?: No Has the patient had a decrease in food intake/or appetite?: No Does the patient have dental problems?: No Has the patient recently lost weight without trying?: 0 Has the patient been eating poorly because of a decreased appetite?: 0 Malnutrition Screening Tool Score: 0    Physical Exam  Physical Exam ROS Blood pressure 112/76, pulse 84, temperature 98.2 F (36.8 C), temperature source Oral, resp. rate 16, SpO2 98%, not currently breastfeeding. There is no height or weight on file to calculate BMI.  Demographic Factors:  {Demographic Factors:20662}  Loss Factors: {Loss Factors:20659}  Historical Factors: {Historical Factors:20660}  Risk Reduction Factors:   {Risk Reduction Factors:20661}  Continued Clinical Symptoms:  {Clinical Factors:22706}  Cognitive Features That Contribute To Risk:  {chl bhh Cognitive Features:304700251}    Suicide Risk:  {BHH SUICIDE RISK:22704}  Plan Of Care/Follow-up recommendations:  {BHH DC FU RECOMMENDATIONS:22620}  Disposition: [employer] due to active warrants for her arrest   -Per patients request, notified friend Kennyth Arnold of disposition. Attempted to reach husband, however no answer and unable to leave voicemail.  Joaquin Courts, NP 04/05/2023, 10:47 AM

## 2023-04-05 NOTE — Discharge Instructions (Signed)
Discharged to Event organiser.

## 2023-04-05 NOTE — ED Notes (Signed)
Pt sleeping@this time breathing even and unlabored will continue to monitor for safety 

## 2023-08-06 ENCOUNTER — Inpatient Hospital Stay (HOSPITAL_COMMUNITY)
Admission: AD | Admit: 2023-08-06 | Discharge: 2023-08-06 | Disposition: A | Discharge status: Home / Self Care | Payer: Medicaid Other | Attending: Obstetrics and Gynecology | Admitting: Obstetrics and Gynecology

## 2023-08-06 ENCOUNTER — Encounter (HOSPITAL_COMMUNITY): Payer: Self-pay | Admitting: *Deleted

## 2023-08-06 ENCOUNTER — Inpatient Hospital Stay (HOSPITAL_COMMUNITY): Payer: Medicaid Other

## 2023-08-06 DIAGNOSIS — Z23 Encounter for immunization: Secondary | ICD-10-CM | POA: Diagnosis not present

## 2023-08-06 DIAGNOSIS — O26851 Spotting complicating pregnancy, first trimester: Secondary | ICD-10-CM | POA: Insufficient documentation

## 2023-08-06 DIAGNOSIS — O200 Threatened abortion: Secondary | ICD-10-CM

## 2023-08-06 DIAGNOSIS — O3680X0 Pregnancy with inconclusive fetal viability, not applicable or unspecified: Secondary | ICD-10-CM | POA: Insufficient documentation

## 2023-08-06 DIAGNOSIS — O26899 Other specified pregnancy related conditions, unspecified trimester: Secondary | ICD-10-CM

## 2023-08-06 DIAGNOSIS — Z3A01 Less than 8 weeks gestation of pregnancy: Secondary | ICD-10-CM | POA: Diagnosis not present

## 2023-08-06 LAB — CBC
HCT: 35 % — ABNORMAL LOW (ref 36.0–46.0)
Hemoglobin: 11.2 g/dL — ABNORMAL LOW (ref 12.0–15.0)
MCH: 24.1 pg — ABNORMAL LOW (ref 26.0–34.0)
MCHC: 32 g/dL (ref 30.0–36.0)
MCV: 75.4 fL — ABNORMAL LOW (ref 80.0–100.0)
Platelets: 425 10*3/uL — ABNORMAL HIGH (ref 150–400)
RBC: 4.64 MIL/uL (ref 3.87–5.11)
RDW: 16.4 % — ABNORMAL HIGH (ref 11.5–15.5)
WBC: 10.4 10*3/uL (ref 4.0–10.5)
nRBC: 0 % (ref 0.0–0.2)

## 2023-08-06 LAB — URINALYSIS, ROUTINE W REFLEX MICROSCOPIC
Bilirubin Urine: NEGATIVE
Glucose, UA: NEGATIVE mg/dL
Hgb urine dipstick: NEGATIVE
Ketones, ur: NEGATIVE mg/dL
Leukocytes,Ua: NEGATIVE
Nitrite: NEGATIVE
Protein, ur: NEGATIVE mg/dL
Specific Gravity, Urine: 1.027 (ref 1.005–1.030)
pH: 5 (ref 5.0–8.0)

## 2023-08-06 LAB — WET PREP, GENITAL
Clue Cells Wet Prep HPF POC: NONE SEEN
Sperm: NONE SEEN
Trich, Wet Prep: NONE SEEN
WBC, Wet Prep HPF POC: >=10 — AB (ref <10)
Yeast Wet Prep HPF POC: NONE SEEN

## 2023-08-06 LAB — POCT PREGNANCY, URINE: Preg Test, Ur: POSITIVE — AB

## 2023-08-06 LAB — HCG, QUANTITATIVE, PREGNANCY: hCG, Beta Chain, Quant, S: 229 m[IU]/mL — ABNORMAL HIGH (ref <5)

## 2023-08-06 MED ORDER — RHO D IMMUNE GLOBULIN 1500 UNIT/2ML IJ SOSY
300.0000 ug | PREFILLED_SYRINGE | Freq: Once | INTRAMUSCULAR | Status: AC
  Administered 2023-08-06: 300 ug via INTRAMUSCULAR
  Filled 2023-08-06: qty 2

## 2023-08-06 NOTE — MAU Provider Note (Signed)
 History     CSN: [id number]  Arrival date and time: 08/06/23 1410   Event Date/Time   First Provider Initiated Contact with Patient 08/06/23 1756      Chief Complaint  Patient presents with   Vaginal Bleeding   Abdominal Pain   Possible Pregnancy   HPI Ms. Kristen Padilla is a 33 y.o. year old [phone] female at [redacted]w[redacted]d weeks gestation who presents to MAU reporting positive home pregnancy test 1 week ago.  She reports she has had spotting and cramping since yesterday.  She reports the spotting is pink when wiping.SABRA  Describes the cramping as being in her upper abdomen.  She reports her menses have been irregular since the delivery of her son last year.  She states I just test all the time.  She reports her LMP as being January 2 through 4 or 5th; SI was on July 17, 2023.  She plans to start prenatal care at [facility]; scheduled for an ultrasound on 08/21/2023.  OB History     Gravida  8   Para  2   Term  1   Preterm  1   AB  5   Living  2      SAB  5   IAB      Ectopic      Multiple  0   Live Births  2        Obstetric Comments  2023 breech, partial abruption         Past Medical History:  Diagnosis Date   Anxiety, generalized    Depression    Miscarriage    Morbid obesity (HCC)    Suicidal behavior with attempted self-injury (HCC) 2014   ibuprofen  OD   Suicidal behavior with attempted self-injury (HCC) 2022   tylenol  and NyQuil OD   UTI (urinary tract infection)     Past Surgical History:  Procedure Laterality Date   CESAREAN SECTION N/A 06/29/2022   Procedure: CESAREAN SECTION;  Surgeon: Gloriann Chick, MD;  Location: [facility];  Service: Obstetrics;  Laterality: N/A;   HERNIA REPAIR     LAPAROSCOPIC GASTRIC SLEEVE RESECTION     Repair of displaced fracture of the left radius  03/2020    Family History  Problem Relation Age of Onset   Diabetes Mother    Hypertension Mother    Depression Mother    Heart attack  Mother    Hypertension Father     Social History   Tobacco Use   Smoking status: Never   Smokeless tobacco: Never  Vaping Use   Vaping status: Never Used  Substance Use Topics   Alcohol use: No   Drug use: No    Allergies:  Allergies  Allergen Reactions   Progesterone  Swelling    Vaginal swelling when taken vaginally   Aripiprazole Rash   Prenatal Vitamins Rash    States she got a rash from taking the prenatal the hospital offers    Sulfa Antibiotics Rash    No medications prior to admission.    Review of Systems  Constitutional: Negative.   HENT: Negative.    Eyes: Negative.   Respiratory: Negative.    Cardiovascular: Negative.   Gastrointestinal: Negative.   Endocrine: Negative.   Genitourinary:  Positive for pelvic pain and vaginal bleeding (spotting).  Musculoskeletal: Negative.   Skin: Negative.   Allergic/Immunologic: Negative.   Neurological: Negative.   Hematological: Negative.   Psychiatric/Behavioral: Negative.     Physical Exam  Blood pressure 121/75, pulse 86, temperature 98.2 F (36.8 C), temperature source Oral, resp. rate 16, height 5' 2 (1.575 m), weight 82.5 kg, last menstrual period 07/03/2023, SpO2 100%, not currently breastfeeding.  Physical Exam Vitals and nursing note reviewed.  Constitutional:      Appearance: Normal appearance. She is obese.  Cardiovascular:     Rate and Rhythm: Normal rate.     Pulses: Normal pulses.  Pulmonary:     Effort: Pulmonary effort is normal.  Abdominal:     Palpations: Abdomen is soft.  Genitourinary:    Comments: Swabs collected by patient using blind swab technique  Musculoskeletal:        General: Normal range of motion.  Skin:    General: Skin is warm and dry.  Neurological:     Mental Status: She is alert and oriented to person, place, and time.     MAU Course  Procedures  MDM [facility] UPT CBC ABO/Rh HCG Wet Prep GC/CT -- Results pending  RPR -- Results pending  OB U/S < 14 wks  TVUS Rhophylac  W/U Rhophylac  Injection  Results for orders placed or performed during the hospital encounter of 08/06/23 (from the past 24 hours)  Blood transfusion report - scanned     Status: None ()   Collection Time: 08/07/23  9:31 AM   Narrative   Ordered by an unspecified provider.  Blood transfusion report - scanned     Status: None   Collection Time: 08/07/23  9:31 AM   Narrative   Ordered by an unspecified provider.   US  OB LESS THAN 14 WEEKS WITH OB TRANSVAGINAL Result Date: 08/06/2023 CLINICAL DATA:  Spotting during pregnancy in the first trimester. Cramps for 2 days. Quantitative beta HCG is pending. Estimated gestational age by LMP is 4 weeks 6 days. EXAM: OBSTETRIC <14 WK US  AND TRANSVAGINAL OB US  TECHNIQUE: Both transabdominal and transvaginal ultrasound examinations were performed for complete evaluation of the gestation as well as the maternal uterus, adnexal regions, and pelvic cul-de-sac. Transvaginal technique was performed to assess early pregnancy. COMPARISON:  None Available. FINDINGS: Intrauterine gestational sac: None Yolk sac:  Not Visualized. Embryo:  Not Visualized. Cardiac Activity: Not Visualized. Maternal uterus/adnexae: Uterus is anteverted. No myometrial mass lesions are demonstrated. Endometrium is homogeneous but thickened, measuring 1.4 cm diameter. Right ovary is visualized and appears normal. Left ovary is seen only in the transabdominal views and appears normal. No free fluid. IMPRESSION: No intrauterine gestational sac, yolk sac, or fetal pole identified. Differential considerations include intrauterine pregnancy too early to be sonographically visualized, missed abortion, or ectopic pregnancy. Followup ultrasound is recommended in 10-14 days for further evaluation. Electronically Signed   By: Elsie Gravely M.D.   On: 08/06/2023 18:10    Assessment and Plan  1. Pregnancy of unknown anatomic location (Primary) - Information provided on ectopic  pregnancy  2. Spotting and cramping affecting pregnancy, antepartum -Information provided on abdominal pain in pregnancy - Return to MAU: If you have heavier bleeding that soaks through more that 2 pads per hour for an hour or more If you bleed so much that you feel like you might pass out or you do pass out If you have significant abdominal pain that is not improved with Tylenol  1000 mg every 8 hours as needed for pain If you develop a fever > 100.5   3. Threatened miscarriage in early pregnancy -Information provided on threatened miscarriage  4. [redacted] weeks gestation of pregnancy   - Discharge patient - Keep  scheduled appt with [facility] on 08/21/2023 - Patient verbalized an understanding of the plan of care and agrees.    Shila Kruczek, CNM 08/06/2023, 5:56 PM

## 2023-08-06 NOTE — Discharge Instructions (Signed)
 Return to MAU: If you have heavier bleeding that soaks through more that 2 pads per hour for an hour or more If you bleed so much that you feel like you might pass out or you do pass out If you have significant abdominal pain that is not improved with Tylenol 1000 mg every 8 hours as needed for pain If you develop a fever > 100.5

## 2023-08-06 NOTE — MAU Note (Signed)
 Has appt at [facility] 2/20.

## 2023-08-06 NOTE — MAU Note (Signed)
 Kristen Padilla is a 33 y.o. at Unknown here in MAU reporting: +HPT wk ago, has taken multiple, not confirmed yet. spotting and cramping  since yesterday. Pink when she wipes, cramping is the upper abd.  LMP: irreg cycles since delivered son, just test all the time... 2 days of bleeding early Jan.  Last real period was end of Nov. Onset of complaint: yesterday Pain score: mild Vitals:   08/06/23 1433  BP: 121/75  Pulse: 86  Resp: 16  Temp: 98.2 F (36.8 C)  SpO2: 100%      Lab orders placed from triage:  UPT, UA, vag cultures

## 2023-08-07 LAB — GC/CHLAMYDIA PROBE AMP (~~LOC~~) NOT AT ARMC
Chlamydia: NEGATIVE
Comment: NEGATIVE
Comment: NORMAL
Neisseria Gonorrhea: NEGATIVE

## 2023-08-07 LAB — RH IG WORKUP (INCLUDES ABO/RH)
ABO/RH(D): O NEG
Antibody Screen: NEGATIVE
Gestational Age(Wks): 4
Unit division: 0

## 2023-08-09 ENCOUNTER — Inpatient Hospital Stay (HOSPITAL_COMMUNITY)
Admission: AD | Admit: 2023-08-09 | Discharge: 2023-08-09 | Disposition: A | Discharge status: Home / Self Care | Payer: Medicaid Other | Attending: Obstetrics and Gynecology | Admitting: Obstetrics and Gynecology

## 2023-08-09 ENCOUNTER — Inpatient Hospital Stay (HOSPITAL_COMMUNITY)
Admission: AD | Admit: 2023-08-09 | Discharge: 2023-08-09 | Disposition: A | Discharge status: Home / Self Care | Payer: Medicaid Other | Source: Home / Self Care | Attending: Obstetrics & Gynecology | Admitting: Obstetrics & Gynecology

## 2023-08-09 DIAGNOSIS — O3680X0 Pregnancy with inconclusive fetal viability, not applicable or unspecified: Secondary | ICD-10-CM | POA: Insufficient documentation

## 2023-08-09 DIAGNOSIS — Z3A01 Less than 8 weeks gestation of pregnancy: Secondary | ICD-10-CM | POA: Insufficient documentation

## 2023-08-09 LAB — HCG, QUANTITATIVE, PREGNANCY: hCG, Beta Chain, Quant, S: 1142 m[IU]/mL — ABNORMAL HIGH (ref <5)

## 2023-08-09 NOTE — MAU Note (Addendum)
 Kristen Padilla is a 33 y.o. at [redacted]w[redacted]d here in MAU reporting: here for repeat BHCG.  States spotting stopped the day she was seen.  Denies any pain.  Onset of complaint: ongoing Pain score: none Vitals:   08/09/23 0927  BP: 124/79  Pulse: 80  Resp: 16  Temp: 98.3 F (36.8 C)  SpO2: 100%      Lab orders placed from triage:  HCG ordered by provider   Pt has f/u scheduled with [facility] for an US     Phleb called for blood draw

## 2023-08-09 NOTE — MAU Provider Note (Signed)
 History     CSN: [id number]  Arrival date and time: 08/09/23 9144   Event Date/Time   First Provider Initiated Contact with Patient 08/09/2023  9:27 AM   Chief Complaint  Patient presents with   Follow-up    HPI  Kristen Padilla is a 33 y.o. [id number] at [redacted]w[redacted]d who presents to the MAU for repeat quant. Initially presented 2/5 for abdominal pain and spotting in setting of early pregnancy. Quant 229, U/S with PUL, received RhoGAM on 2/5. She states abdominal pain and bleeding have resolved, feels well today, no changes in health.  Past Medical History:  Diagnosis Date   Anxiety, generalized    Depression    Miscarriage    Morbid obesity (HCC)    Suicidal behavior with attempted self-injury (HCC) 2014   ibuprofen  OD   Suicidal behavior with attempted self-injury (HCC) 2022   tylenol  and NyQuil OD   UTI (urinary tract infection)     Past Surgical History:  Procedure Laterality Date   CESAREAN SECTION N/A 06/29/2022   Procedure: CESAREAN SECTION;  Surgeon: Gloriann Chick, MD;  Location: [facility] LD [facility]S;  Service: Obstetrics;  Laterality: N/A;   HERNIA REPAIR     LAPAROSCOPIC GASTRIC SLEEVE RESECTION     Repair of displaced fracture of the left radius  03/2020    Family History  Problem Relation Age of Onset   Diabetes Mother    Hypertension Mother    Depression Mother    Heart attack Mother    Hypertension Father     Social History   Tobacco Use   Smoking status: Never   Smokeless tobacco: Never  Vaping Use   Vaping status: Never Used  Substance Use Topics   Alcohol use: No   Drug use: No    Allergies:  Allergies  Allergen Reactions   Progesterone  Swelling    Vaginal swelling when taken vaginally   Aripiprazole Rash   Prenatal Vitamins Rash    States she got a rash from taking the prenatal the hospital offers    Sulfa Antibiotics Rash    No medications prior to admission.    ROS reviewed and pertinent positives and negatives as documented in HPI.   Physical Exam   Blood pressure 124/79, pulse 80, temperature 98.3 F (36.8 C), temperature source Oral, resp. rate 16, height 5' 2 (1.575 m), weight 82.5 kg, last menstrual period 07/03/2023, SpO2 100%, not currently breastfeeding.  Physical Exam Constitutional:      General: She is not in acute distress.    Appearance: Normal appearance. She is not ill-appearing.  HENT:     Head: Normocephalic and atraumatic.  Cardiovascular:     Rate and Rhythm: Normal rate.  Pulmonary:     Effort: Pulmonary effort is normal.     Breath sounds: Normal breath sounds.  Abdominal:     Palpations: Abdomen is soft.     Tenderness: There is no abdominal tenderness. There is no guarding.  Musculoskeletal:        General: Normal range of motion.  Skin:    General: Skin is warm and dry.     Findings: No rash.  Neurological:     General: No focal deficit present.     Mental Status: She is alert and oriented to person, place, and time.     MAU Course  Procedures  MDM 33 y.o. [id number] at [redacted]w[redacted]d presenting for repeat quant given PUL. Asymptomatic and well appearing. Will draw labs and call w results.  12:13 PM Quant back, went from 229 to 1142 today -- appropriate rise. Patient discussed w on-call physician, Dr. Letha, who will reach out to [facility] to get pt scheduled for f/up visit.  12:37 PM  Called pt to discuss results. Ectopic precautions reviewed.   Assessment and Plan  Pregnancy of unknown anatomic location Quant rising appropriately Will get scheduled w OB for repeat lab Monday and U/S  Ectopic precautions reviewed   Alain Sor, MD OB Fellow, Faculty Practice [facility], [facility]  08/09/2023, 9:51 AM

## 2023-08-25 LAB — OB RESULTS CONSOLE RPR: RPR: NONREACTIVE

## 2023-08-25 LAB — OB RESULTS CONSOLE HIV ANTIBODY (ROUTINE TESTING): HIV: NONREACTIVE

## 2023-08-25 LAB — OB RESULTS CONSOLE HEPATITIS B SURFACE ANTIGEN: Hepatitis B Surface Ag: NEGATIVE

## 2023-09-15 ENCOUNTER — Other Ambulatory Visit: Payer: Self-pay | Admitting: Obstetrics and Gynecology

## 2023-09-15 DIAGNOSIS — Z8759 Personal history of other complications of pregnancy, childbirth and the puerperium: Secondary | ICD-10-CM

## 2023-09-19 ENCOUNTER — Other Ambulatory Visit: Payer: Self-pay | Admitting: Obstetrics and Gynecology

## 2023-09-19 DIAGNOSIS — Z8759 Personal history of other complications of pregnancy, childbirth and the puerperium: Secondary | ICD-10-CM

## 2023-10-18 ENCOUNTER — Inpatient Hospital Stay (HOSPITAL_COMMUNITY)
Admission: AD | Admit: 2023-10-18 | Discharge: 2023-10-18 | Disposition: A | Discharge status: Home / Self Care | Attending: Obstetrics and Gynecology | Admitting: Obstetrics and Gynecology

## 2023-10-18 DIAGNOSIS — O99891 Other specified diseases and conditions complicating pregnancy: Secondary | ICD-10-CM | POA: Insufficient documentation

## 2023-10-18 DIAGNOSIS — Z3A15 15 weeks gestation of pregnancy: Secondary | ICD-10-CM | POA: Insufficient documentation

## 2023-10-18 DIAGNOSIS — R1012 Left upper quadrant pain: Secondary | ICD-10-CM | POA: Diagnosis present

## 2023-10-18 DIAGNOSIS — R519 Headache, unspecified: Secondary | ICD-10-CM | POA: Diagnosis present

## 2023-10-18 DIAGNOSIS — Z711 Person with feared health complaint in whom no diagnosis is made: Secondary | ICD-10-CM

## 2023-10-18 DIAGNOSIS — M549 Dorsalgia, unspecified: Secondary | ICD-10-CM | POA: Insufficient documentation

## 2023-10-18 MED ORDER — CYCLOBENZAPRINE HCL 5 MG PO TABS
10.0000 mg | ORAL_TABLET | Freq: Once | ORAL | Status: AC
  Administered 2023-10-18: 10 mg via ORAL
  Filled 2023-10-18: qty 2

## 2023-10-18 MED ORDER — CYCLOBENZAPRINE HCL 5 MG PO TABS: 5.0000 mg | ORAL_TABLET | Freq: Three times a day (TID) | ORAL | 0 refills | Status: DC | PRN

## 2023-10-18 MED ORDER — ACETAMINOPHEN 500 MG PO TABS: 1000.0000 mg | ORAL_TABLET | Freq: Once | ORAL | Status: DC

## 2023-10-18 NOTE — MAU Provider Note (Addendum)
 History     CSN: [id number]  Arrival date and time: 10/18/23 4098   Event Date/Time   First Provider Initiated Contact with Patient 10/18/23 1959      Chief Complaint  Patient presents with   Abdominal Pain   Headache    See note   HPI Ms. Kristen Padilla is a 33 y.o. year old [phone] female at [redacted]w[redacted]d weeks gestation who presents to MAU reporting sharp pain in LT mid abdomen, pressure in her head x 3 days. She reports "like something is just off." She took Tylenol  1000 mg po at 1630. She reports the abdominal pain as a 7/10, head pressure 6/10. She is "hypersensitive" to every ache and pain that goes on d/t h/o C/S for abruption at 31 weeks 15 months ago. She receives [facility] with [facility]; next appt is 10/21/2023.   OB History     Gravida  8   Para  2   Term  1   Preterm  1   AB  5   Living  2      SAB  5   IAB      Ectopic      Multiple  0   Live Births  2        Obstetric Comments  2023 breech, partial abruption         Past Medical History:  Diagnosis Date   Anxiety, generalized    Depression    Miscarriage    Morbid obesity (HCC)    Suicidal behavior with attempted self-injury (HCC) 2014   ibuprofen  OD   Suicidal behavior with attempted self-injury (HCC) 2022   tylenol  and NyQuil OD   UTI (urinary tract infection)     Past Surgical History:  Procedure Laterality Date   CESAREAN SECTION N/A 06/29/2022   Procedure: CESAREAN SECTION;  Surgeon: Vernal Gold, MD;  Location: [facility] LD [facility]S;  Service: Obstetrics;  Laterality: N/A;   HERNIA REPAIR     LAPAROSCOPIC GASTRIC SLEEVE RESECTION     Repair of displaced fracture of the left radius  03/2020    Family History  Problem Relation Age of Onset   Diabetes Mother    Hypertension Mother    Depression Mother    Heart attack Mother    Hypertension Father     Social History   Tobacco Use   Smoking status: Never   Smokeless tobacco: Never  Vaping Use   Vaping status:  Never Used  Substance Use Topics   Alcohol use: No   Drug use: No    Allergies:  Allergies  Allergen Reactions   Progesterone  Swelling    Vaginal swelling when taken vaginally   Aripiprazole Rash   Prenatal Vitamins Rash    States she got a rash from taking the prenatal the hospital offers    Sulfa Antibiotics Rash    No medications prior to admission.    Review of Systems  Constitutional: Negative.   HENT: Negative.    Eyes: Negative.   Respiratory: Negative.    Cardiovascular: Negative.   Gastrointestinal:  Positive for abdominal pain (upper mid LT abdomen) and nausea (with the head pressure).  Endocrine: Negative.   Musculoskeletal: Negative.   Skin: Negative.   Allergic/Immunologic: Negative.   Neurological:  Positive for headaches ("pressure").  Hematological: Negative.   Psychiatric/Behavioral: Negative.     Physical Exam   Blood pressure 106/66, pulse (!) 111, temperature 98.3 F (36.8 C), temperature source Oral, resp. rate 16, height  5\' 2"  (1.575 m), weight 83.9 kg, last menstrual period 07/03/2023, SpO2 100%, not currently breastfeeding.  Physical Exam Vitals and nursing note reviewed.  Constitutional:      Appearance: Normal appearance. She is obese.  Cardiovascular:     Rate and Rhythm: Tachycardia present.  Pulmonary:     Effort: Pulmonary effort is normal.  Neurological:     Mental Status: She is alert.    FHTs by doppler: 157 bpm  MAU Course  Procedures  MDM Flexeril  10 mg po -- pain resolved @ 2100  Report given to and care assumed by Atha Lazar, CNM @ [address] CNM 10/18/2023, 7:59 PM  Assessment and Plan   1. Back pain, unspecified back location, unspecified back pain laterality, unspecified chronicity   2. Physically well but worried   3. [redacted] weeks gestation of pregnancy    - Reviewed back pain, and that it can be a normal discomfort of pregnancy.  - Reviewed worsening signs and return precautions.  - FHT obtained in  triage.  - Patient has an appt scheduled for 4/22. Encouraged to keep appt.  - Patient discharged home in stable condition and may return to MAU as needed.    Maurie Southern) Marlys Singh, MSN, CNM  Center for [facility]  10/19/2023 12:30 AM

## 2023-10-18 NOTE — MAU Note (Signed)
.  Kristen Padilla is a 33 y.o. at [redacted]w[redacted]d here in MAU reporting: sharp pain in L mid abdomen.  Pressure in her head x 3 days Denies vaginal bleeding or discharge, uterine cramping or contraction like pain. Last BM-yesterday - irregular BM-Once a week is her " normal" Feeling fetal flutters "I just feel like something is off" Has taken Tylenol  1000mg   - last dose at 430pm Has had ultrasound Onset of complaint: yesterday at 1400 Pain score: 7 abdominal pain-sharp-intermittent                     6 head-"not headache like pain"-pressure-intermittent-at times causes nausea\ Headaches have been occurring over the last 2 weeks Denies visual changes-spots, flashes of light or tunneling Care received at [facility] Next appointment April 22 Denies substance use or smoking History of miscarriage-last child delivered at 31 weeks by emergent CS for abruption Vitals:   10/18/23 1932  BP: 106/66  Pulse: (!) 111  Resp: 16  Temp: 98.3 F (36.8 C)  SpO2: 100%     FHT: 157bpm  Lab orders placed from triage: None

## 2023-10-22 DIAGNOSIS — Z903 Acquired absence of stomach [part of]: Secondary | ICD-10-CM | POA: Insufficient documentation

## 2023-10-22 DIAGNOSIS — O09899 Supervision of other high risk pregnancies, unspecified trimester: Secondary | ICD-10-CM | POA: Insufficient documentation

## 2023-10-22 DIAGNOSIS — O34219 Maternal care for unspecified type scar from previous cesarean delivery: Secondary | ICD-10-CM | POA: Insufficient documentation

## 2023-10-22 DIAGNOSIS — O09299 Supervision of pregnancy with other poor reproductive or obstetric history, unspecified trimester: Secondary | ICD-10-CM | POA: Insufficient documentation

## 2023-10-22 DIAGNOSIS — O99210 Obesity complicating pregnancy, unspecified trimester: Secondary | ICD-10-CM | POA: Insufficient documentation

## 2023-10-22 DIAGNOSIS — N96 Recurrent pregnancy loss: Secondary | ICD-10-CM | POA: Insufficient documentation

## 2023-10-27 ENCOUNTER — Ambulatory Visit

## 2023-10-27 ENCOUNTER — Other Ambulatory Visit

## 2023-10-27 DIAGNOSIS — N96 Recurrent pregnancy loss: Secondary | ICD-10-CM

## 2023-10-27 DIAGNOSIS — O34219 Maternal care for unspecified type scar from previous cesarean delivery: Secondary | ICD-10-CM

## 2023-10-27 DIAGNOSIS — O09899 Supervision of other high risk pregnancies, unspecified trimester: Secondary | ICD-10-CM

## 2023-10-27 DIAGNOSIS — O99210 Obesity complicating pregnancy, unspecified trimester: Secondary | ICD-10-CM

## 2023-10-27 DIAGNOSIS — O09299 Supervision of pregnancy with other poor reproductive or obstetric history, unspecified trimester: Secondary | ICD-10-CM

## 2023-10-27 DIAGNOSIS — Z903 Acquired absence of stomach [part of]: Secondary | ICD-10-CM

## 2023-11-12 ENCOUNTER — Ambulatory Visit: Attending: Obstetrics and Gynecology | Admitting: Maternal & Fetal Medicine

## 2023-11-12 ENCOUNTER — Other Ambulatory Visit: Payer: Self-pay | Admitting: *Deleted

## 2023-11-12 ENCOUNTER — Other Ambulatory Visit: Payer: Self-pay | Admitting: Obstetrics and Gynecology

## 2023-11-12 ENCOUNTER — Ambulatory Visit

## 2023-11-12 VITALS — BP 130/74 | HR 109

## 2023-11-12 DIAGNOSIS — Z8759 Personal history of other complications of pregnancy, childbirth and the puerperium: Secondary | ICD-10-CM

## 2023-11-12 DIAGNOSIS — O99210 Obesity complicating pregnancy, unspecified trimester: Secondary | ICD-10-CM

## 2023-11-12 DIAGNOSIS — O09219 Supervision of pregnancy with history of pre-term labor, unspecified trimester: Secondary | ICD-10-CM | POA: Diagnosis not present

## 2023-11-12 DIAGNOSIS — D6862 Lupus anticoagulant syndrome: Secondary | ICD-10-CM | POA: Diagnosis not present

## 2023-11-12 DIAGNOSIS — E669 Obesity, unspecified: Secondary | ICD-10-CM

## 2023-11-12 DIAGNOSIS — Z903 Acquired absence of stomach [part of]: Secondary | ICD-10-CM

## 2023-11-12 DIAGNOSIS — O99213 Obesity complicating pregnancy, third trimester: Secondary | ICD-10-CM | POA: Insufficient documentation

## 2023-11-12 DIAGNOSIS — O34219 Maternal care for unspecified type scar from previous cesarean delivery: Secondary | ICD-10-CM

## 2023-11-12 DIAGNOSIS — O360120 Maternal care for anti-D [Rh] antibodies, second trimester, not applicable or unspecified: Secondary | ICD-10-CM

## 2023-11-12 DIAGNOSIS — Z3689 Encounter for other specified antenatal screening: Secondary | ICD-10-CM | POA: Insufficient documentation

## 2023-11-12 DIAGNOSIS — Z362 Encounter for other antenatal screening follow-up: Secondary | ICD-10-CM

## 2023-11-12 DIAGNOSIS — Z9884 Bariatric surgery status: Secondary | ICD-10-CM | POA: Insufficient documentation

## 2023-11-12 DIAGNOSIS — O09293 Supervision of pregnancy with other poor reproductive or obstetric history, third trimester: Secondary | ICD-10-CM | POA: Insufficient documentation

## 2023-11-12 DIAGNOSIS — O2622 Pregnancy care for patient with recurrent pregnancy loss, second trimester: Secondary | ICD-10-CM

## 2023-11-12 DIAGNOSIS — O99212 Obesity complicating pregnancy, second trimester: Secondary | ICD-10-CM

## 2023-11-12 DIAGNOSIS — O09213 Supervision of pregnancy with history of pre-term labor, third trimester: Secondary | ICD-10-CM | POA: Insufficient documentation

## 2023-11-12 DIAGNOSIS — Z3A31 31 weeks gestation of pregnancy: Secondary | ICD-10-CM | POA: Insufficient documentation

## 2023-11-12 DIAGNOSIS — N96 Recurrent pregnancy loss: Secondary | ICD-10-CM

## 2023-11-12 DIAGNOSIS — O09292 Supervision of pregnancy with other poor reproductive or obstetric history, second trimester: Secondary | ICD-10-CM

## 2023-11-12 DIAGNOSIS — Z3A18 18 weeks gestation of pregnancy: Secondary | ICD-10-CM

## 2023-11-12 DIAGNOSIS — Z9151 Personal history of suicidal behavior: Secondary | ICD-10-CM | POA: Diagnosis not present

## 2023-11-12 DIAGNOSIS — O09899 Supervision of other high risk pregnancies, unspecified trimester: Secondary | ICD-10-CM

## 2023-11-12 DIAGNOSIS — O99842 Bariatric surgery status complicating pregnancy, second trimester: Secondary | ICD-10-CM

## 2023-11-12 NOTE — Progress Notes (Signed)
 Patient information  Patient Name: Kristen Padilla [facility] Padilla  Patient MRN:   [id number]  Referring practice: MFM Referring Provider: [facility] ([facility])  MFM CONSULT  [name] is a 33 y.o. [phone] at [redacted]w[redacted]d here for ultrasound and consultation. 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   History of multiple spontaneous abortions 10/22/2023   Pregnancy with history of cesarean section, antepartum 10/22/2023   History of shoulder dystocia in prior pregnancy, currently pregnant 10/22/2023   History of preterm delivery, currently pregnant 10/22/2023   Adjustment disorder with depressed mood 01/15/2023   MDD (major depressive disorder) 01/14/2023   Overdose, intentional self-harm, initial encounter (HCC) 01/12/2023   Rh negative, antepartum 12/20/2021   PTSD (post-traumatic stress disorder) 06/12/2020   GAD (generalized anxiety disorder) 06/05/2020   Depression     Kristen Padilla is doing well today with no acute concerns.  RE hx of subchorionic hemorrhage with cervical funneling around 19 weeks resulting in a 31w abruption and CD: The patient has a complicated history in her most recent pregnancy.  She reports that she had a subchorionic hematoma early in the pregnancy.  The patient denied any known risk factors such as tobacco or marijuana use, chronic hypertension or trauma.  Around her anatomy scan (21 weeks) she had cervical funneling and was prescribed vaginal progesterone  but had an allergic reaction of vaginal swelling.  Her doctors changed this to oral progesterone .  Her cervical length never decreased per her report.  She had a number of bleeding episodes of pregnancy requiring hospitalization.  Eventually  there was concern for clinically significant vaginal bleeding and a loss of fetal heart tones resulting in an emergency cesarean delivery.  The patient desires a vaginal birth this pregnancy.   I discussed the various risks of recurrence for each clinical problem described above.  I reassured the patient that there is no evidence of abruption or placental bleeding this pregnancy.  The risk of recurrence of a placental abruption is about 5% if you have 1 previous abruption.  It is difficult to counsel the patient regarding the cervical funneling and strategy to prevent preterm birth given this was not spontaneous.  I discussed clinical symptom monitoring versus transvaginal cervical lengths versus oral progesterone .  The patient elected to have a transvaginal cervical length in 3 weeks.  She declined oral progesterone  and will monitor for symptoms consistent with signs of preterm birth such as contractions, vaginal bleeding or loss of fluid.  I discussed there is no proven strategy to reduce the risk of placental abruption but given her clinical history it would be prudent to assess antiphospholipid antibodies (lupus anticoagulant, beta-2 glycoprotein, anticardiolipin antibodies) as well as starting 81 mg of aspirin to reduce the risk of recurrent abruption as well as reduce risk of preeclampsia.  Sonographic findings Single intrauterine pregnancy at 18w 1d  Fetal cardiac activity:  Observed and appears normal. Presentation: Cephalic. The anatomic structures that were well seen appear normal without evidence of soft markers. Due to poor acoustic windows some structures remain suboptimally visualized. Fetal biometry shows the estimated fetal weight at the 41 percentile.  Amniotic fluid: Within normal limits.  MVP:  cm. Placenta: Anterior. Adnexa: No abnormality visualized. Cervical length: 3.8 cm.  There are limitations of prenatal ultrasound such  as the inability to detect certain abnormalities due  to poor visualization. Various factors such as fetal position, gestational age and maternal body habitus may increase the difficulty in visualizing the fetal anatomy.    Recommendations - EDD should be 04/13/2024 based on  Early Ultrasound  (08/14/23). - Follow-up transvaginal ultrasound in 3 weeks - Serial growth ultrasounds throughout the pregnancy - Declined progesterone  supplementation  - OB provider to order antiphospholipid antibodies (lupus anticoagulant, beta-2 glycoprotein, anticardiolipin antibodies)  Review of Systems: A review of systems was performed and was negative except per HPI   Vitals and Physical Exam    11/12/2023    7:25 AM 10/18/2023    7:32 PM 08/09/2023    9:27 AM  Vitals with BMI  Height  5\' 2"  5\' 2"   Weight  185 lbs 181 lbs 13 oz  BMI  33.83 33.24  Systolic 130 106 191  Diastolic 74 66 79  Pulse 109 111 80   Sitting comfortably on the sonogram table Nonlabored breathing Normal rate and rhythm Abdomen is nontender  Past pregnancies OB History  Gravida Para Term Preterm AB Living  8 2 1 1 5 2   SAB IAB Ectopic Multiple Live Births  5   0 2    # Outcome Date GA Lbr Len/2nd Weight Sex Type Anes PTL Lv  8 Current           7 Preterm 06/29/22 [redacted]w[redacted]d  4 lb 3.4 oz (1.91 kg) M CS-LTranv Gen  LIV  6 Term 12/14/10    F Vag-Spont   LIV     Complications: Shoulder Dystocia  5 SAB           4 SAB           3 SAB           2 SAB           1 SAB             Obstetric Comments  2023 breech, partial abruption     I spent 45 minutes reviewing the patients chart, including labs and images as well as counseling the patient about her medical conditions. Greater than 50% of the time was spent in direct face-to-face patient counseling.  Penney Bowling  MFM, [facility]   11/12/2023  8:53 AM

## 2023-12-02 ENCOUNTER — Ambulatory Visit (HOSPITAL_BASED_OUTPATIENT_CLINIC_OR_DEPARTMENT_OTHER)

## 2023-12-02 ENCOUNTER — Other Ambulatory Visit: Payer: Self-pay | Admitting: *Deleted

## 2023-12-02 ENCOUNTER — Ambulatory Visit: Attending: Obstetrics and Gynecology | Admitting: Obstetrics and Gynecology

## 2023-12-02 VITALS — BP 118/70 | HR 91

## 2023-12-02 DIAGNOSIS — Z362 Encounter for other antenatal screening follow-up: Secondary | ICD-10-CM | POA: Diagnosis not present

## 2023-12-02 DIAGNOSIS — O34219 Maternal care for unspecified type scar from previous cesarean delivery: Secondary | ICD-10-CM

## 2023-12-02 DIAGNOSIS — O09212 Supervision of pregnancy with history of pre-term labor, second trimester: Secondary | ICD-10-CM | POA: Insufficient documentation

## 2023-12-02 DIAGNOSIS — O99212 Obesity complicating pregnancy, second trimester: Secondary | ICD-10-CM | POA: Diagnosis not present

## 2023-12-02 DIAGNOSIS — E669 Obesity, unspecified: Secondary | ICD-10-CM

## 2023-12-02 DIAGNOSIS — O360120 Maternal care for anti-D [Rh] antibodies, second trimester, not applicable or unspecified: Secondary | ICD-10-CM | POA: Insufficient documentation

## 2023-12-02 DIAGNOSIS — Z3A21 21 weeks gestation of pregnancy: Secondary | ICD-10-CM

## 2023-12-02 DIAGNOSIS — O09292 Supervision of pregnancy with other poor reproductive or obstetric history, second trimester: Secondary | ICD-10-CM | POA: Diagnosis not present

## 2023-12-02 DIAGNOSIS — O4692 Antepartum hemorrhage, unspecified, second trimester: Secondary | ICD-10-CM | POA: Diagnosis not present

## 2023-12-02 DIAGNOSIS — O2622 Pregnancy care for patient with recurrent pregnancy loss, second trimester: Secondary | ICD-10-CM

## 2023-12-02 DIAGNOSIS — Z903 Acquired absence of stomach [part of]: Secondary | ICD-10-CM

## 2023-12-02 NOTE — Progress Notes (Signed)
 Maternal-Fetal Medicine Consultation Name: Kristen Padilla MRN: [id number]   G8 P1152 at 21-weeks' gestation and is here for completion of fetal anatomy and cervical length measurement.  Her pregnancy is dated by first trimester ultrasound performed at your office and her EDD is established at 04/13/2024. Patient reports she had vaginal bleeding yesterday.  She does not give history of sexual intercourse.  No history of abdominal pain.  Obstetric history significant for 5 early spontaneous miscarriages.  She had a term vaginal delivery followed by a preterm cesarean delivery in 2023 at [redacted] weeks gestation because of heavy vaginal bleeding and nonreassuring fetal heart tracings. Both her children are in good health.  Ultrasound Fetal growth is appropriate for gestational age.  Amniotic fluid is normal good fetal activity seen.  Fetal anatomical survey was completed and appears normal.  Placenta appears normal with no evidence of retroplacental hemorrhage. On transvaginal ultrasound, the cervix measures 3.7 cm.  No shortening or funneling was seen on transfundal pressure.  No evidence of previa.  I reassured the patient of the findings.  Ultrasound has limitations in diagnosing placental abruption.  I counseled the patient that recurrent vaginal bleeding increases the risk of preterm labor/delivery. Her blood type is Rh negative. Patient reports that on Panorama testing, the fetus was found to be Rh positive. She received RhoGAM about 15 to 16 weeks ago. I recommended that she take RhoGAM now to prevent alloimmunization.  I discussed with Havery Lions, CNM, who will be calling our patient today and give Rhogam at her office.  Recommendations - An appointment was made for her to return in 4 weeks for fetal growth assessment (indication-vaginal bleeding).  Consultation including face-to-face (more than 50%) counseling 20 minutes.

## 2023-12-11 ENCOUNTER — Inpatient Hospital Stay (HOSPITAL_COMMUNITY)

## 2023-12-11 ENCOUNTER — Encounter (HOSPITAL_COMMUNITY): Payer: Self-pay | Admitting: Obstetrics & Gynecology

## 2023-12-11 ENCOUNTER — Inpatient Hospital Stay (HOSPITAL_COMMUNITY)
Admission: AD | Admit: 2023-12-11 | Discharge: 2023-12-11 | Disposition: A | Discharge status: Home / Self Care | Attending: Obstetrics & Gynecology | Admitting: Obstetrics & Gynecology

## 2023-12-11 DIAGNOSIS — Z3A22 22 weeks gestation of pregnancy: Secondary | ICD-10-CM

## 2023-12-11 DIAGNOSIS — O4692 Antepartum hemorrhage, unspecified, second trimester: Secondary | ICD-10-CM

## 2023-12-11 DIAGNOSIS — O99891 Other specified diseases and conditions complicating pregnancy: Secondary | ICD-10-CM

## 2023-12-11 DIAGNOSIS — O09212 Supervision of pregnancy with history of pre-term labor, second trimester: Secondary | ICD-10-CM | POA: Diagnosis not present

## 2023-12-11 DIAGNOSIS — R102 Pelvic and perineal pain: Secondary | ICD-10-CM | POA: Diagnosis not present

## 2023-12-11 DIAGNOSIS — O26892 Other specified pregnancy related conditions, second trimester: Secondary | ICD-10-CM | POA: Diagnosis not present

## 2023-12-11 DIAGNOSIS — O09899 Supervision of other high risk pregnancies, unspecified trimester: Secondary | ICD-10-CM

## 2023-12-11 LAB — WET PREP, GENITAL
Clue Cells Wet Prep HPF POC: NONE SEEN
Sperm: NONE SEEN
Trich, Wet Prep: NONE SEEN
WBC, Wet Prep HPF POC: >=10 — AB (ref <10)
Yeast Wet Prep HPF POC: NONE SEEN

## 2023-12-11 LAB — URINALYSIS, ROUTINE W REFLEX MICROSCOPIC
Bilirubin Urine: NEGATIVE
Glucose, UA: NEGATIVE mg/dL
Hgb urine dipstick: NEGATIVE
Ketones, ur: NEGATIVE mg/dL
Leukocytes,Ua: NEGATIVE
Nitrite: NEGATIVE
Protein, ur: NEGATIVE mg/dL
Specific Gravity, Urine: 1.02 (ref 1.005–1.030)
pH: 5 (ref 5.0–8.0)

## 2023-12-11 MED ORDER — METRONIDAZOLE 500 MG PO TABS: 500.0000 mg | ORAL_TABLET | Freq: Two times a day (BID) | ORAL | 0 refills | Status: DC

## 2023-12-11 NOTE — MAU Note (Signed)
 Kristen Padilla is a 33 y.o. at [redacted]w[redacted]d here in MAU reporting: has been having bleeding.  Saw a lot more blood in the toilet around 1400, also some clear stuff, like she peed on herself. Does not have a pad on, stayed in the bathroom 'till it stopped.  Also is cramping really bad.  Hx of abruption with last preg. Denies any recent intercourse. Feeling movement Onset of complaint: 1400 Pain score: 7 Vitals:   12/11/23 1650  BP: 118/70  Pulse: (!) 108  Resp: 18  Temp: 98.8 F (37.1 C)  SpO2: 100%     FHT:170 Lab orders placed from triage:

## 2023-12-11 NOTE — MAU Provider Note (Signed)
 History     CSN: [id number]  Arrival date and time: 12/11/23 1634   None     Chief Complaint  Patient presents with   Vaginal Bleeding   Pelvic Pain   HPI  OB History     Gravida  8   Para  2   Term  1   Preterm  1   AB  5   Living  2      SAB  5   IAB      Ectopic      Multiple  0   Live Births  2        Obstetric Comments  2023 breech, partial abruption         Past Medical History:  Diagnosis Date   Anxiety, generalized    Bupropion  overdose 01/12/2023   Wellbutrin  OD suicide attempt.     Closed fracture of left distal radius 03/29/2020   Added automatically from request for surgery [id number]     Depression    Leukocytosis 05/21/2020   Miscarriage    Morbid obesity (HCC)    Overdose 05/22/2020   Placental abruption in third trimester 06/28/2022   Second trimester bleeding 06/17/2022   Suicidal behavior with attempted self-injury (HCC) 2014   ibuprofen  OD   Suicidal behavior with attempted self-injury (HCC) 2022   tylenol  and NyQuil OD   Tylenol  overdose, intentional self-harm, initial encounter (HCC) 05/21/2020   UTI (urinary tract infection)    Vaginal bleeding in pregnancy, third trimester 06/27/2022    Past Surgical History:  Procedure Laterality Date   CESAREAN SECTION N/A 06/29/2022   Procedure: CESAREAN SECTION;  Surgeon: Vernal Gold, MD;  Location: [facility];  Service: Obstetrics;  Laterality: N/A;   HERNIA REPAIR     LAPAROSCOPIC GASTRIC SLEEVE RESECTION     Repair of displaced fracture of the left radius  03/2020    Family History  Problem Relation Age of Onset   Diabetes Mother    Hypertension Mother    Depression Mother    Heart attack Mother    Hypertension Father     Social History   Tobacco Use   Smoking status: Never   Smokeless tobacco: Never  Vaping Use   Vaping status: Never Used  Substance Use Topics   Alcohol use: No   Drug use: No    Allergies:  Allergies  Allergen Reactions   Progesterone   Swelling    Vaginal swelling when taken vaginally   Aripiprazole Rash   Prenatal Vitamins Rash    States she got a rash from taking the prenatal the hospital offers    Sulfa Antibiotics Rash    Medications Prior to Admission  Medication Sig Dispense Refill Last Dose/Taking   cyclobenzaprine  (FLEXERIL ) 5 MG tablet Take 1 tablet (5 mg total) by mouth 3 (three) times daily as needed for muscle spasms. (Patient not taking: Reported on 12/02/2023) 20 tablet 0     Review of Systems Physical Exam   Blood pressure 112/74, pulse 100, temperature 98.8 F (37.1 C), temperature source Oral, resp. rate 18, height 5' 2 (1.575 m), weight 82.6 kg, last menstrual period 07/03/2023, SpO2 98%, not currently breastfeeding.  Physical Exam Vitals and nursing note reviewed.  Constitutional:      Appearance: Normal appearance.  HENT:     Head: Normocephalic and atraumatic.     Nose: No congestion or rhinorrhea.   Eyes:     Extraocular Movements: Extraocular movements intact.    Cardiovascular:  Rate and Rhythm: Normal rate.  Pulmonary:     Effort: Pulmonary effort is normal.  Abdominal:     Palpations: Abdomen is soft.     Tenderness: There is no abdominal tenderness.  Genitourinary:    Vagina: Vaginal discharge present.     Comments: On SVE 1.5/thick/ feet at internal os   Musculoskeletal:        General: Normal range of motion.     Cervical back: Normal range of motion.   Skin:    General: Skin is warm.     Capillary Refill: Capillary refill takes less than 2 seconds.   Neurological:     General: No focal deficit present.     Mental Status: She is alert.     Cranial Nerves: No cranial nerve deficit.   Psychiatric:        Mood and Affect: Mood normal.        Behavior: Behavior normal.     MAU Course  Procedures  MDM US   Wet prep  Gc/chlamydia   Assessment and Plan  .Kristen Padilla is a 33 year old G8 P1-1-5-2 at 22 weeks and 1 day presenting for vaginal  bleeding  Vaginal bleeding in pregnancy Patient reports bleeding for the last 2 weeks.  Reports no recent intercourse but that she had had intercourse approximately 2 weeks ago with a partner who she recently found out was exposed to trichomonas with another partner.  SVE showing cervix approximately 1.5 cm dilated.  No bleeding appreciated.  Ultrasound showing cervix approximately 4 cm thick.  Spoke with on-call provider for patient's clinic.  Will treat patient for presumptive trichomonas although wet prep was negative.  Urine culture pending.  Discussed strict return precautions.  No further questions or concerns.  Patient discharged home.  Adiba Fargnoli V Merinda Victorino 12/11/2023, 5:25 PM

## 2023-12-11 NOTE — Discharge Instructions (Signed)
 It was great taking care of you today.  If your bleeding worsens or you have any other concerns please return for further evaluation.  I sent a prescription for metronidazole  to your pharmacy which will treat trichomonas if you have it which we are presuming since we know you were exposed to it.  I hope you have a wonderful night!

## 2023-12-11 NOTE — Progress Notes (Signed)
 S: pt was seen by myself at bedside for assessment of SVE. Pt complaints about lower abdominal cramping, which does come and go but has noticed this for a couple week, not getting strong, has vaginal bleeding in the toilet but none on exam or on gloves during exam, no recent intercourse, c/o husband having trich. Denies dysuria, hematuria or burning with urination.  O: BP 112/74 (BP Location: Right Arm)   Pulse 100   Temp 98.8 F (37.1 C) (Oral)   Resp 16   Ht 5' 2 (1.575 m)   Wt 82.6 kg   LMP 07/03/2023 (Approximate)   SpO2 98%   BMI 33.29 kg/m  SVE 1 external, internal too posterior to assess, thick, fetal position not felt ABD: soft none tender Uterus: gravida A/P: Not bleeding noted, cervix thick on vaginal exam, received rhogam 6/5 with [facility], transvaginal US  showed 3.5 inch cervix. Pt stable to be discharge with F/U with ccob. UC sent and remains pending, and trich neg, [facility] [employer] plans of treating r/t partner having trich per pt.   Kristen Padilla  CNM, FNP, PMHNP 12/11/23 6:15 PM  Spent  30 mins face to face >50% in education.

## 2023-12-12 LAB — GC/CHLAMYDIA PROBE AMP (~~LOC~~) NOT AT ARMC
Chlamydia: NEGATIVE
Comment: NEGATIVE
Comment: NORMAL
Neisseria Gonorrhea: NEGATIVE

## 2023-12-13 LAB — CULTURE, OB URINE: Culture: NO GROWTH

## 2023-12-23 ENCOUNTER — Inpatient Hospital Stay (HOSPITAL_COMMUNITY)
Admission: AD | Admit: 2023-12-23 | Discharge: 2023-12-23 | Disposition: A | Discharge status: Home / Self Care | Payer: Self-pay | Attending: Obstetrics and Gynecology | Admitting: Obstetrics and Gynecology

## 2023-12-23 ENCOUNTER — Encounter (HOSPITAL_COMMUNITY): Payer: Self-pay | Admitting: Obstetrics and Gynecology

## 2023-12-23 DIAGNOSIS — O99212 Obesity complicating pregnancy, second trimester: Secondary | ICD-10-CM | POA: Diagnosis not present

## 2023-12-23 DIAGNOSIS — O99213 Obesity complicating pregnancy, third trimester: Secondary | ICD-10-CM | POA: Diagnosis not present

## 2023-12-23 DIAGNOSIS — O4703 False labor before 37 completed weeks of gestation, third trimester: Secondary | ICD-10-CM | POA: Diagnosis not present

## 2023-12-23 DIAGNOSIS — Z3A23 23 weeks gestation of pregnancy: Secondary | ICD-10-CM | POA: Diagnosis not present

## 2023-12-23 DIAGNOSIS — O09213 Supervision of pregnancy with history of pre-term labor, third trimester: Secondary | ICD-10-CM | POA: Diagnosis not present

## 2023-12-23 DIAGNOSIS — O09899 Supervision of other high risk pregnancies, unspecified trimester: Secondary | ICD-10-CM

## 2023-12-23 DIAGNOSIS — O99342 Other mental disorders complicating pregnancy, second trimester: Secondary | ICD-10-CM | POA: Diagnosis not present

## 2023-12-23 DIAGNOSIS — O4593 Premature separation of placenta, unspecified, third trimester: Secondary | ICD-10-CM | POA: Insufficient documentation

## 2023-12-23 DIAGNOSIS — O4692 Antepartum hemorrhage, unspecified, second trimester: Secondary | ICD-10-CM | POA: Diagnosis not present

## 2023-12-23 DIAGNOSIS — O321XX0 Maternal care for breech presentation, not applicable or unspecified: Secondary | ICD-10-CM | POA: Diagnosis not present

## 2023-12-23 DIAGNOSIS — O4702 False labor before 37 completed weeks of gestation, second trimester: Secondary | ICD-10-CM | POA: Diagnosis not present

## 2023-12-23 LAB — URINALYSIS, ROUTINE W REFLEX MICROSCOPIC
Bacteria, UA: NONE SEEN
Bilirubin Urine: NEGATIVE
Glucose, UA: 150 mg/dL — AB
Ketones, ur: 5 mg/dL — AB
Leukocytes,Ua: NEGATIVE
Nitrite: NEGATIVE
Protein, ur: 30 mg/dL — AB
Specific Gravity, Urine: 1.029 (ref 1.005–1.030)
pH: 5 (ref 5.0–8.0)

## 2023-12-23 MED ORDER — IBUPROFEN 600 MG PO TABS
600.0000 mg | ORAL_TABLET | Freq: Once | ORAL | Status: AC
  Administered 2023-12-23: 600 mg via ORAL
  Filled 2023-12-23: qty 1

## 2023-12-23 NOTE — MAU Note (Signed)
 Kristen Padilla is a 33 y.o. at [redacted]w[redacted]d here in MAU reporting intermittent spotting for a few wks since first of June. States bleeding was heavier this am but now just when she wipes. States she is having contractions since 12noon that are consistent. Took Excedrin which helped alittle. Has been drinking lots of flds. Last delivery was at 31wks and had short cervix with that pregnancy  LMP: n/a Onset of complaint: 12n Pain score: 8 Vitals:   12/23/23 2209 12/23/23 2213  BP:  132/70  Pulse: (!) 125   Resp: 17   Temp: 98.2 F (36.8 C)   SpO2: 100%      FHT: 160  Lab orders placed from triage: u/a

## 2023-12-23 NOTE — MAU Provider Note (Signed)
 Chief Complaint:  Vaginal Bleeding and Contractions   Event Date/Time   First Provider Initiated Contact with Patient 12/23/23 2245     HPI: Kristen Padilla is a 33 y.o. [id number] at 48w6dwho presents to maternity admissions reporting intermittent spotting for several weeks.  Has been having some contractions.. She reports good fetal movement, denies LOF, vaginal itching/burning, urinary symptoms, h/a, dizziness, n/v, diarrhea, constipation or fever/chills.    Vaginal Bleeding The patient's primary symptoms include vaginal bleeding. The patient's pertinent negatives include no genital odor. The current episode started yesterday. The pain is mild. Pertinent negatives include no chills. The vaginal discharge was bloody. The vaginal bleeding is spotting. She has not been passing clots. She has not been passing tissue. Nothing aggravates the symptoms. She has tried nothing for the symptoms.   RN Note:  Kristen Padilla is a 33 y.o. at [redacted]w[redacted]d here in MAU reporting intermittent spotting for a few wks since first of June. States bleeding was heavier this am but now just when she wipes. States she is having contractions since 12noon that are consistent. Took Excedrin which helped alittle. Has been drinking lots of flds. Last delivery was at 31wks and had short cervix with that pregnancy   LMP: n/a Onset of complaint: 12n Pain score: 8      Past Medical History: Past Medical History:  Diagnosis Date   Anxiety, generalized    Bupropion  overdose 01/12/2023   Wellbutrin  OD suicide attempt.     Closed fracture of left distal radius 03/29/2020   Added automatically from request for surgery [id number]     Depression    Leukocytosis 05/21/2020   Miscarriage    Morbid obesity (HCC)    Overdose 05/22/2020   Placental abruption in third trimester 06/28/2022   Second trimester bleeding 06/17/2022   Suicidal behavior with attempted self-injury (HCC) 2014   ibuprofen  OD   Suicidal  behavior with attempted self-injury (HCC) 2022   tylenol  and NyQuil OD   Tylenol  overdose, intentional self-harm, initial encounter (HCC) 05/21/2020   UTI (urinary tract infection)    Vaginal bleeding in pregnancy, third trimester 06/27/2022    Past obstetric history: OB History  Gravida Para Term Preterm AB Living  8 2 1 1 5 2   SAB IAB Ectopic Multiple Live Births  5   0 2    # Outcome Date GA Lbr Len/2nd Weight Sex Type Anes PTL Lv  8 Current           7 Preterm 06/29/22 [redacted]w[redacted]d  1910 g M CS-LTranv Gen  LIV  6 Term 12/14/10    F Vag-Spont   LIV     Complications: Shoulder Dystocia  5 SAB           4 SAB           3 SAB           2 SAB           1 SAB             Obstetric Comments  2023 breech, partial abruption    Past Surgical History: Past Surgical History:  Procedure Laterality Date   CESAREAN SECTION N/A 06/29/2022   Procedure: CESAREAN SECTION;  Surgeon: Gloriann Chick, MD;  Location: [facility];  Service: Obstetrics;  Laterality: N/A;   HERNIA REPAIR     LAPAROSCOPIC GASTRIC SLEEVE RESECTION     Repair of displaced fracture of the left radius  03/2020    Family  History: Family History  Problem Relation Age of Onset   Diabetes Mother    Hypertension Mother    Depression Mother    Heart attack Mother    Hypertension Father     Social History: Social History   Tobacco Use   Smoking status: Never   Smokeless tobacco: Never  Vaping Use   Vaping status: Never Used  Substance Use Topics   Alcohol use: No   Drug use: No    Allergies:  Allergies  Allergen Reactions   Progesterone  Swelling    Vaginal swelling when taken vaginally   Aripiprazole Rash   Prenatal Vitamins Rash    States she got a rash from taking the prenatal the hospital offers    Sulfa Antibiotics Rash    Meds:  Medications Prior to Admission  Medication Sig Dispense Refill Last Dose/Taking   cyclobenzaprine  (FLEXERIL ) 5 MG tablet Take 1 tablet (5 mg total) by mouth 3 (three) times  daily as needed for muscle spasms. (Patient not taking: Reported on 12/02/2023) 20 tablet 0    metroNIDAZOLE  (FLAGYL ) 500 MG tablet Take 1 tablet (500 mg total) by mouth 2 (two) times daily. (Patient not taking: Reported on 12/23/2023) 14 tablet 0 Not Taking    I have reviewed patient's Past Medical Hx, Surgical Hx, Family Hx, Social Hx, medications and allergies.   ROS:  Review of Systems  Constitutional:  Negative for chills.  Genitourinary:  Positive for vaginal bleeding.   Other systems negative  Physical Exam  Patient Vitals for the past 24 hrs:  BP Temp Pulse Resp SpO2 Height Weight  12/23/23 2213 132/70 -- -- -- -- -- --  12/23/23 2209 -- 98.2 F (36.8 C) (!) 125 17 100 % 5' 2 (1.575 m) 81.9 kg   Constitutional: Well-developed, well-nourished female in no acute distress.  Cardiovascular: normal rate  Respiratory: normal effort GI: Abd soft, non-tender, gravid appropriate for gestational age.   No rebound or guarding. MS: Extremities nontender, no edema, normal ROM Neurologic: Alert and oriented x 4.  PELVIC EXAM: Cervix pink, visually closed, without lesion, scant white creamy discharge, vaginal walls and external genitalia normal  No blood seen  Cervix is 1cm and thick.    FHT:  Baseline 145 , moderate variability, accelerations present, no decelerations Contractions:   Rare   Labs: Results for orders placed or performed during the hospital encounter of 12/23/23 (from the past 24 hours)  Urinalysis, Routine w reflex microscopic -Urine, Clean Catch     Status: Abnormal   Collection Time: 12/23/23 10:17 PM  Result Value Ref Range   Color, Urine YELLOW YELLOW   APPearance HAZY (A) CLEAR   Specific Gravity, Urine 1.029 1.005 - 1.030   pH 5.0 5.0 - 8.0   Glucose, UA 150 (A) NEGATIVE mg/dL   Hgb urine dipstick SMALL (A) NEGATIVE   Bilirubin Urine NEGATIVE NEGATIVE   Ketones, ur 5 (A) NEGATIVE mg/dL   Protein, ur 30 (A) NEGATIVE mg/dL   Nitrite NEGATIVE NEGATIVE    Leukocytes,Ua NEGATIVE NEGATIVE   RBC / HPF 0-5 0 - 5 RBC/hpf   WBC, UA 0-5 0 - 5 WBC/hpf   Bacteria, UA NONE SEEN NONE SEEN   Squamous Epithelial / HPF 0-5 0 - 5 /HPF   Mucus PRESENT    Hyaline Casts, UA PRESENT     --/--/O NEG (02/05 1627)  Imaging:    MAU Course/MDM: I have reviewed the triage vital signs and the nursing notes.   Pertinent labs &  imaging results that were available during my care of the patient were reviewed by me and considered in my medical decision making (see chart for details).      I have reviewed her medical records including past results, notes and treatments.   I have ordered labs and reviewed results.  NST reviewed   Treatments in MAU included EFM, ibuprofen  given for uterine cramping. .    Assessment: Single IUP at [redacted]w[redacted]d History of preterm delivery Spotting in pregnancy Threatened preterm labor  Plan: Discharge home Preterm Labor precautions and fetal kick counts Follow up in Office for prenatal visits and recheck Encouraged to return if she develops worsening of symptoms, increase in pain, fever, or other concerning symptoms.   Pt stable at time of discharge.  Earnie Pouch CNM, MSN Certified Nurse-Midwife 12/23/2023 10:46 PM

## 2023-12-25 ENCOUNTER — Encounter (HOSPITAL_COMMUNITY): Payer: Self-pay | Admitting: Obstetrics and Gynecology

## 2023-12-25 ENCOUNTER — Inpatient Hospital Stay (HOSPITAL_COMMUNITY)
Admission: AD | Admit: 2023-12-25 | Discharge: 2024-01-02 | DRG: 831 | Discharge status: Against Medical Advice | Payer: Self-pay | Attending: Obstetrics and Gynecology | Admitting: Obstetrics and Gynecology

## 2023-12-25 ENCOUNTER — Inpatient Hospital Stay (HOSPITAL_COMMUNITY)

## 2023-12-25 DIAGNOSIS — Z6791 Unspecified blood type, Rh negative: Secondary | ICD-10-CM | POA: Diagnosis not present

## 2023-12-25 DIAGNOSIS — O09212 Supervision of pregnancy with history of pre-term labor, second trimester: Secondary | ICD-10-CM

## 2023-12-25 DIAGNOSIS — Z882 Allergy status to sulfonamides status: Secondary | ICD-10-CM | POA: Diagnosis not present

## 2023-12-25 DIAGNOSIS — O4692 Antepartum hemorrhage, unspecified, second trimester: Principal | ICD-10-CM | POA: Diagnosis present

## 2023-12-25 DIAGNOSIS — O26892 Other specified pregnancy related conditions, second trimester: Secondary | ICD-10-CM | POA: Diagnosis present

## 2023-12-25 DIAGNOSIS — O368320 Maternal care for abnormalities of the fetal heart rate or rhythm, second trimester, not applicable or unspecified: Secondary | ICD-10-CM | POA: Diagnosis present

## 2023-12-25 DIAGNOSIS — Z3A24 24 weeks gestation of pregnancy: Secondary | ICD-10-CM

## 2023-12-25 DIAGNOSIS — O99212 Obesity complicating pregnancy, second trimester: Secondary | ICD-10-CM | POA: Diagnosis not present

## 2023-12-25 DIAGNOSIS — O4592 Premature separation of placenta, unspecified, second trimester: Principal | ICD-10-CM | POA: Diagnosis present

## 2023-12-25 DIAGNOSIS — O99012 Anemia complicating pregnancy, second trimester: Secondary | ICD-10-CM | POA: Diagnosis present

## 2023-12-25 DIAGNOSIS — F32A Depression, unspecified: Secondary | ICD-10-CM | POA: Diagnosis present

## 2023-12-25 DIAGNOSIS — D509 Iron deficiency anemia, unspecified: Secondary | ICD-10-CM | POA: Diagnosis present

## 2023-12-25 DIAGNOSIS — O34219 Maternal care for unspecified type scar from previous cesarean delivery: Secondary | ICD-10-CM | POA: Diagnosis present

## 2023-12-25 DIAGNOSIS — Z903 Acquired absence of stomach [part of]: Secondary | ICD-10-CM

## 2023-12-25 DIAGNOSIS — Z8249 Family history of ischemic heart disease and other diseases of the circulatory system: Secondary | ICD-10-CM

## 2023-12-25 DIAGNOSIS — Z833 Family history of diabetes mellitus: Secondary | ICD-10-CM

## 2023-12-25 DIAGNOSIS — E669 Obesity, unspecified: Secondary | ICD-10-CM | POA: Diagnosis not present

## 2023-12-25 DIAGNOSIS — Z8759 Personal history of other complications of pregnancy, childbirth and the puerperium: Secondary | ICD-10-CM

## 2023-12-25 DIAGNOSIS — O99210 Obesity complicating pregnancy, unspecified trimester: Secondary | ICD-10-CM | POA: Diagnosis present

## 2023-12-25 DIAGNOSIS — O328XX0 Maternal care for other malpresentation of fetus, not applicable or unspecified: Secondary | ICD-10-CM | POA: Diagnosis not present

## 2023-12-25 LAB — WET PREP, GENITAL
Clue Cells Wet Prep HPF POC: NONE SEEN
Sperm: NONE SEEN
Trich, Wet Prep: NONE SEEN
WBC, Wet Prep HPF POC: <10 (ref <10)
Yeast Wet Prep HPF POC: NONE SEEN

## 2023-12-25 LAB — CBC
HCT: 27.3 % — ABNORMAL LOW (ref 36.0–46.0)
HCT: 27.4 % — ABNORMAL LOW (ref 36.0–46.0)
HCT: 27.9 % — ABNORMAL LOW (ref 36.0–46.0)
Hemoglobin: 8.5 g/dL — ABNORMAL LOW (ref 12.0–15.0)
Hemoglobin: 8.5 g/dL — ABNORMAL LOW (ref 12.0–15.0)
Hemoglobin: 8.7 g/dL — ABNORMAL LOW (ref 12.0–15.0)
MCH: 23.1 pg — ABNORMAL LOW (ref 26.0–34.0)
MCH: 23.2 pg — ABNORMAL LOW (ref 26.0–34.0)
MCH: 23.3 pg — ABNORMAL LOW (ref 26.0–34.0)
MCHC: 31 g/dL (ref 30.0–36.0)
MCHC: 31.1 g/dL (ref 30.0–36.0)
MCHC: 31.2 g/dL (ref 30.0–36.0)
MCV: 74.5 fL — ABNORMAL LOW (ref 80.0–100.0)
MCV: 74.6 fL — ABNORMAL LOW (ref 80.0–100.0)
MCV: 74.6 fL — ABNORMAL LOW (ref 80.0–100.0)
Platelets: 363 10*3/uL (ref 150–400)
Platelets: 365 10*3/uL (ref 150–400)
Platelets: 374 10*3/uL (ref 150–400)
RBC: 3.66 MIL/uL — ABNORMAL LOW (ref 3.87–5.11)
RBC: 3.68 MIL/uL — ABNORMAL LOW (ref 3.87–5.11)
RBC: 3.74 MIL/uL — ABNORMAL LOW (ref 3.87–5.11)
RDW: 14.8 % (ref 11.5–15.5)
RDW: 15 % (ref 11.5–15.5)
RDW: 15.1 % (ref 11.5–15.5)
WBC: 12.1 10*3/uL — ABNORMAL HIGH (ref 4.0–10.5)
WBC: 12.9 10*3/uL — ABNORMAL HIGH (ref 4.0–10.5)
WBC: 14.1 10*3/uL — ABNORMAL HIGH (ref 4.0–10.5)
nRBC: 0 % (ref 0.0–0.2)
nRBC: 0 % (ref 0.0–0.2)
nRBC: 0 % (ref 0.0–0.2)

## 2023-12-25 LAB — GC/CHLAMYDIA PROBE AMP (~~LOC~~) NOT AT ARMC
Chlamydia: NEGATIVE
Comment: NEGATIVE

## 2023-12-25 LAB — PROTIME-INR
INR: 1 (ref 0.8–1.2)
Prothrombin Time: 13.7 s (ref 11.4–15.2)

## 2023-12-25 LAB — APTT: aPTT: 30 s (ref 24–36)

## 2023-12-25 MED ORDER — RHO D IMMUNE GLOBULIN 1500 UNIT/2ML IJ SOSY
300.0000 ug | PREFILLED_SYRINGE | Freq: Once | INTRAMUSCULAR | Status: DC
  Filled 2023-12-25: qty 2

## 2023-12-25 MED ORDER — SODIUM CHLORIDE 0.9% FLUSH: 3.0000 mL | INTRAVENOUS | Status: DC | PRN

## 2023-12-25 MED ORDER — SODIUM CHLORIDE 0.9% FLUSH
3.0000 mL | Freq: Two times a day (BID) | INTRAVENOUS | Status: DC
  Administered 2023-12-26: 10 mL via INTRAVENOUS
  Administered 2023-12-26: 3 mL via INTRAVENOUS
  Administered 2023-12-26 – 2024-01-02 (×9): 10 mL via INTRAVENOUS

## 2023-12-25 MED ORDER — DOCUSATE SODIUM 100 MG PO CAPS
100.0000 mg | ORAL_CAPSULE | Freq: Every day | ORAL | Status: DC
  Administered 2023-12-29 – 2024-01-01 (×2): 100 mg via ORAL
  Filled 2023-12-25 (×7): qty 1

## 2023-12-25 MED ORDER — ACETAMINOPHEN 325 MG PO TABS
650.0000 mg | ORAL_TABLET | ORAL | Status: DC | PRN
  Administered 2023-12-25 – 2023-12-31 (×7): 650 mg via ORAL
  Filled 2023-12-25 (×7): qty 2

## 2023-12-25 MED ORDER — SODIUM CHLORIDE 0.9% IV SOLUTION
Freq: Once | INTRAVENOUS | Status: DC
Start: 2023-12-25 — End: 2024-01-03

## 2023-12-25 MED ORDER — SODIUM CHLORIDE 0.9 % IV BOLUS: 1000.0000 mL | Freq: Once | INTRAVENOUS | Status: DC

## 2023-12-25 MED ORDER — LACTATED RINGERS IV BOLUS
500.0000 mL | Freq: Once | INTRAVENOUS | Status: AC
  Administered 2023-12-25: 500 mL via INTRAVENOUS

## 2023-12-25 MED ORDER — BETAMETHASONE SOD PHOS & ACET 6 (3-3) MG/ML IJ SUSP
12.0000 mg | INTRAMUSCULAR | Status: AC
  Administered 2023-12-25 – 2023-12-26 (×2): 12 mg via INTRAMUSCULAR
  Filled 2023-12-25: qty 5

## 2023-12-25 MED ORDER — SODIUM CHLORIDE 0.9 % IV SOLN
500.0000 mg | Freq: Once | INTRAVENOUS | Status: AC
  Administered 2023-12-25: 500 mg via INTRAVENOUS
  Filled 2023-12-25: qty 25

## 2023-12-25 NOTE — MAU Provider Note (Signed)
 History     CSN: [id number]  Arrival date and time: 12/25/23 0955   None     Chief Complaint  Patient presents with   Vaginal Bleeding   HPI Patient is presenting for evaluation for vaginal bleeding in the second trimester.  Has been seen multiple times over the last month for the vaginal bleeding.  She has history of preterm delivery with placental abruption at 31 weeks requiring emergency C-section.  Reports that she has been bleeding off and on for the past few weeks but yesterday she was having heavier bleeding changing her pads multiple time.  Today she feels like is a little bit lighter but she is still noticing the bleeding.  She denies any passage of clots.  Denies any recent intercourse.  Reports cramping occurred on Tuesday but not much since then.  States that when she was here on Tuesday they checked her cervix and she was 1 cm dilated.  OB History     Gravida  8   Para  2   Term  1   Preterm  1   AB  5   Living  2      SAB  5   IAB      Ectopic      Multiple  0   Live Births  2        Obstetric Comments  2023 breech, partial abruption         Past Medical History:  Diagnosis Date   Anxiety, generalized    Bupropion  overdose 01/12/2023   Wellbutrin  OD suicide attempt.     Closed fracture of left distal radius 03/29/2020   Added automatically from request for surgery [id number]     Depression    Leukocytosis 05/21/2020   Miscarriage    Morbid obesity (HCC)    Overdose 05/22/2020   Placental abruption in third trimester 06/28/2022   Second trimester bleeding 06/17/2022   Suicidal behavior with attempted self-injury (HCC) 2014   ibuprofen  OD   Suicidal behavior with attempted self-injury (HCC) 2022   tylenol  and NyQuil OD   Tylenol  overdose, intentional self-harm, initial encounter (HCC) 05/21/2020   UTI (urinary tract infection)    Vaginal bleeding in pregnancy, third trimester 06/27/2022    Past Surgical History:  Procedure Laterality  Date   CESAREAN SECTION N/A 06/29/2022   Procedure: CESAREAN SECTION;  Surgeon: Gloriann Chick, MD;  Location: [facility];  Service: Obstetrics;  Laterality: N/A;   HERNIA REPAIR     LAPAROSCOPIC GASTRIC SLEEVE RESECTION     Repair of displaced fracture of the left radius  03/2020    Family History  Problem Relation Age of Onset   Diabetes Mother    Hypertension Mother    Depression Mother    Heart attack Mother    Hypertension Father     Social History   Tobacco Use   Smoking status: Never   Smokeless tobacco: Never  Vaping Use   Vaping status: Never Used  Substance Use Topics   Alcohol use: No   Drug use: No    Allergies:  Allergies  Allergen Reactions   Progesterone  Swelling    Vaginal swelling when taken vaginally   Aripiprazole Rash   Prenatal Vitamins Rash    States she got a rash from taking the prenatal the hospital offers    Sulfa Antibiotics Rash    Medications Prior to Admission  Medication Sig Dispense Refill Last Dose/Taking   cyclobenzaprine  (FLEXERIL ) 5 MG tablet Take  1 tablet (5 mg total) by mouth 3 (three) times daily as needed for muscle spasms. (Patient not taking: Reported on 12/02/2023) 20 tablet 0    metroNIDAZOLE  (FLAGYL ) 500 MG tablet Take 1 tablet (500 mg total) by mouth 2 (two) times daily. (Patient not taking: Reported on 12/23/2023) 14 tablet 0     Review of Systems  Constitutional:  Negative for chills and fever.  Gastrointestinal:  Positive for abdominal pain (Cramping 48 hours ago).  Genitourinary:  Positive for vaginal bleeding and vaginal discharge.  All other systems reviewed and are negative.  Physical Exam   Blood pressure 108/65, pulse 86, temperature 98.9 F (37.2 C), temperature source Oral, resp. rate 16, height 5' 2 (1.575 m), weight 82.7 kg, last menstrual period 07/03/2023, SpO2 99%, not currently breastfeeding.  Physical Exam Vitals and nursing note reviewed.  Constitutional:      Appearance: Normal appearance.  HENT:      Head: Normocephalic and atraumatic.     Nose: No congestion or rhinorrhea.   Eyes:     Extraocular Movements: Extraocular movements intact.    Cardiovascular:     Rate and Rhythm: Normal rate.  Pulmonary:     Effort: Pulmonary effort is normal.  Abdominal:     Palpations: Abdomen is soft.     Tenderness: There is no abdominal tenderness.  Genitourinary:    Comments: GU exam showing blood on inner thighs, speculum introduced and clots fell into the speculum, cervix with no visible friability, clots noted coming through the cervix  Musculoskeletal:        General: Normal range of motion.     Cervical back: Normal range of motion.   Skin:    General: Skin is warm.     Capillary Refill: Capillary refill takes less than 2 seconds.   Neurological:     General: No focal deficit present.     Mental Status: She is alert.     Cranial Nerves: No cranial nerve deficit.   Psychiatric:        Mood and Affect: Mood normal.        Behavior: Behavior normal.     MAU Course  Procedures  MDM CBC Type and screen Ultrasound   Assessment and Plan  Helvi Royals is a 33 yo [id number] @ [redacted]w[redacted]d senting for vaginal bleeding in the second trimester.  Vaginal bleeding in the second trimester Patient reports bleeding off and on for the last few weeks.  Worse yesterday.  She did not notice any clots over the last day but reports she was filling multiple pads.  On exam clots noted coming from cervix.  Cervix 1 cm which she reports is stable from last cervical exam.  Ultrasound was unremarkable.  Discussed with on-call provider who will admit for observation.    Devone Tousley V Dakwon Wenberg 12/25/2023, 11:17 AM

## 2023-12-25 NOTE — Consult Note (Signed)
 Consultation Service: Neonatology   Dr. Letha has asked for consultation on Ms. Anyia Gierke Leos-Torres with MRN# [id number] regarding the care of a premature infant at [redacted]w[redacted]d. Thank you for inviting us  to see this patient.   Reason for consult:  Explain the possible complications, the prognosis, and the care of a premature infant at [redacted]week gestation.   Chief complaint: 34yo female with a singleton IUP presenting with vaginal bleeding/ Estimated weight of 798 grams.   My key findings of this patient's HPI are:  I have reviewed the patient's chart and have met with her. The salient information is as follows:   PMH: abruption at 31weeks, obesity, short cervix, sleeve gastrectomy, previous c/s delivery, Rh negative, PTSD, MDD, and GAD   Prenatal care:   good Pregnancy complications:  Vaginal bleeding without obvious placental abruption on u/s at this time Maternal Steroids: 1 dose received 12/25/23    My recommendations for this patient and my actions included:   1. In the presence of the Ms. Tawni Dene Caldron Leos-Torres, I discussed the possible complications and outcomes of prematurity at this gestational age. I discussed the common complications and survival data with a hospital average for active treatment of 45-70% prior to steroids, increasing to 60-84% after steroids. I discussed the potential need for resuscitation at birth, mechanical ventilation and surfactant administration for respiratory distress, IV fluids pending establishment of enteral feeds (encouraged breast milk feeding to which she planned to do), antibiotics for possible sepsis, temperature support, and monitoring. I also discussed the potential risk of complications such as intracranial hemorrhage, retinopathy, SIP/NEC and chronic lung disease. I also discussed the potential length of stay in the neonatal intensive care unit for about 20-22 weeks. I discussed this with mother in detail and she expressed an  understanding of the risks and complications of prematurity.   2. I also discussed the potential for severe neurological complications and school difficulties. She expressed an understanding of this information.   3. I informed her that the NICU team would be present at the delivery. She agreed that all appropriate medical measures could be taken to resuscitate her infant at the delivery. She also understood that depending on her infant's initial condition and NICU course, some difficult decisions may have to be made.    Final Impression:  33yo female with a singleton IUP presenting with vaginal bleeding, following for potential placental abruption, who now understands the possible complications and prognosis of her infant at [redacted]w[redacted]d.   Almarie Lies, MD Attending Neonatologist Total time spent in consultation 45 minutes of which >50% was spent in face to face counseling with the patient.

## 2023-12-25 NOTE — MAU Note (Signed)
 Kristen Padilla is a 33 y.o. at [redacted]w[redacted]d here in MAU reporting: reports filled 3 pads yesterday (moderate on picture).  They wanted her to come yesterday , but she didn't have child care.   Light this morning and not cramping like she was.  No pain today  Onset of complaint: yesterday Pain score: none Vitals:   12/25/23 1009  BP: 116/66  Pulse: 96  Resp: 16  Temp: 98.9 F (37.2 C)  SpO2: 100%     FHT:150 Lab orders placed from triage:

## 2023-12-25 NOTE — Progress Notes (Addendum)
 Late entry  Antepartum Progress Note  Subjective: I was notified by the RN that the patient had increased VB @1949  when urinating. Now occurring several times but most recently improving in amount. The RN noted about 8-10 ml on the pad. The patient also endorsed increased pelvic cramping and increased urge to urinate. She denies increasing abdominal pain. She expresses concern for increased bleeding.   Denies fevers, chills, chest pain, visual changes, SOB, RUQ/epigastric pain, N/V, dysuria, hematuria, or sudden onset/worsening bilateral LE or facial edema.  Objective: BP 125/76 (BP Location: Left Arm)   Pulse (!) 103   Temp 97.7 F (36.5 C) (Oral)   Resp 18   Ht 5' 2 (1.575 m)   Wt 82.7 kg   LMP 07/03/2023 (Approximate)   SpO2 99%   BMI 33.34 kg/m  Gen:  NAD, pleasant and cooperative Cardio: mild tachycardia Pulm: normal effort Abd:  Soft, gravid, non-distended, non-tender throughout, no rebound/guarding Ext:  No bilateral LE edema, ambulating without issue SVE: 1/thick/high, scant to mild blood on exam glove  Results for orders placed or performed during the hospital encounter of 12/25/23  Type and screen [facility]   Collection Time: 12/25/23 11:57 AM  Result Value Ref Range   ABO/RH(D) O NEG    Antibody Screen NEG    Sample Expiration      12/28/2023,2359 Performed at [facility], [address], [place], [place] [address]    Unit Number [id number]    Blood Component Type RED CELLS,LR    Unit division 00    Status of Unit ALLOCATED    Transfusion Status OK TO TRANSFUSE    Crossmatch Result Compatible    Unit Number [id number]    Blood Component Type RED CELLS,LR    Unit division 00    Status of Unit ALLOCATED    Transfusion Status OK TO TRANSFUSE    Crossmatch Result Compatible   BPAM RBC   Collection Time: 12/25/23 11:57 AM  Result Value Ref Range   Blood Product Unit Number [id number]    PRODUCT CODE E0382V00    Unit Type  and Rh 9500    Blood Product Expiration Date 797492987640    Blood Product Unit Number [id number]    PRODUCT CODE E0382V00    Unit Type and Rh 9500    Blood Product Expiration Date 797492957640   CBC   Collection Time: 12/25/23 11:58 AM  Result Value Ref Range   WBC 12.1 (H) 4.0 - 10.5 K/uL   RBC 3.68 (L) 3.87 - 5.11 MIL/uL   Hemoglobin 8.5 (L) 12.0 - 15.0 g/dL   HCT 72.5 (L) 63.9 - 53.9 %   MCV 74.5 (L) 80.0 - 100.0 fL   MCH 23.1 (L) 26.0 - 34.0 pg   MCHC 31.0 30.0 - 36.0 g/dL   RDW 84.8 88.4 - 84.4 %   Platelets 365 150 - 400 K/uL   nRBC 0.0 0.0 - 0.2 %  Wet prep, genital   Collection Time: 12/25/23 12:43 PM   Specimen: PATH Cytology Urine  Result Value Ref Range   Yeast Wet Prep HPF POC NONE SEEN NONE SEEN   Trich, Wet Prep NONE SEEN NONE SEEN   Clue Cells Wet Prep HPF POC NONE SEEN NONE SEEN   WBC, Wet Prep HPF POC <10 <10   Sperm NONE SEEN   CBC   Collection Time: 12/25/23  9:17 PM  Result Value Ref Range   WBC 12.9 (H) 4.0 - 10.5 K/uL  RBC 3.74 (L) 3.87 - 5.11 MIL/uL   Hemoglobin 8.7 (L) 12.0 - 15.0 g/dL   HCT 72.0 (L) 63.9 - 53.9 %   MCV 74.6 (L) 80.0 - 100.0 fL   MCH 23.3 (L) 26.0 - 34.0 pg   MCHC 31.2 30.0 - 36.0 g/dL   RDW 84.9 88.4 - 84.4 %   Platelets 374 150 - 400 K/uL   nRBC 0.0 0.0 - 0.2 %     A/P: Kristen Padilla is a 33 y.o. female, [phone], IUP at 24.1 weeks, presenting for admission to OBS for vaginal bleeding in second trimester. Bleeding started approx 1 month ago on and off, denies intercourse, been to MAU 3 times in last month, US  remain unremarkable, no abruptions noted, but has a h/o abruption at 31 weeks. Pt did receive rhogam for [facility]- on December 04 2023 with ccob. Pt endorses increased bleeding this evening while urinating. Blood noted on her pad and in the toilet bowl after urinating. Starting hgb was 8.5, asymptomatic, pt appears pale but VSS. Mild occasional pelvic cramping. Denies vaginal leakage. +FM. US  today with EFW was  1.12lbs, 86%, transverse, 140, AFI wnl, anterior placenta.   I have consulted MFM, NICU, and Dr. Storm. Plan -continue monitoring VB likely to continue with bathroom trips but consider reassessing timing for delivery if increased bleeding, cervical advancement, maternal, or fetal complications -Obtain stat CBC, transfuse 1-2 units if hgb 7 or less.  -Limited OB US  scheduled for tomorrow  In the event the patient required delivery via c-section over night she was consented.  The patient has declined salpingectomy in the event of a c-section overnight.   The risks, benefits and alternatives of surgery were discussed with the patient including but were not limited to: bleeding which may require transfusion or reoperation; infection which may require antibiotics; injury to bowel, bladder, ureters or other surrounding organs; injury to the fetus; need for additional procedures including hysterectomy in the event of a life-threatening hemorrhage; formation of adhesions; placental abnormalities wth subsequent pregnancies; incisional problems; thromboembolic phenomenon and other postoperative/anesthesia complications.   Rojean Sacramento, DO Family Medicine & Ob[facility] 12/25/2023, 9:35 PM

## 2023-12-25 NOTE — Consult Note (Signed)
 Maternal-Fetal Medicine Consultation Name: Kristen Padilla MRN:1990  G8 [id number] at 24w 1d gestation.  Patient presented to the MAU with severe vaginal bleeding.  No uterine contractions.  Patient has been having vaginal bleeding since the beginning of this month.  She had received RhoGAM on 0 12/04/23. No history of dizziness or blackouts or shortness of breath.  She has intermittent headaches.  No abdominal pain or dysuria. At the MAU evaluation, the cervix was 1 cm dilated cephalic presentation.  Patient received first course of betamethasone . At ultrasound performed today morning, the fetal growth is appropriate for gestational age.  No obvious evidence of placental abruption was seen.  No evidence of previa or placenta accreta spectrum. On cell-free fetal DNA screening, the risks of fetal aneuploidies are not increased.  The fetus is Rh positive.  Obstetric history 11/2010: Normal vaginal delivery at term of a female infant.  Her daughter is in good health. 05/2022: Preterm cesarean delivery of a female infant weighing 4 pounds and 3 ounces at birth.  Her pregnancy was complicated by placental abruption that was confirmed at cesarean delivery.  Patient presented with heavy bleeding before delivery.  Her pregnancy was complicated by gestational diabetes. In addition, she had 5 spontaneous early miscarriages. GYN history: No history of abnormal Pap smears or cervical surgeries.  No history of breast biopsies.  Past medical history: No history of hypertension or diabetes or any chronic medical conditions. Past surgical history: Cesarean section, right forearm surgery, bariatric surgery (2022). Medications: Prenatal vitamins, betamethasone . Allergies: Sulfa drugs (hives), Abilify (hives), progesterone  (swelling). Social history: Denies tobacco or drug or alcohol use.  Patient reports she never used cocaine.  Her partner is Hispanic, and he is in good health.  P/E: Patient is lying  comfortably in bed; not in distress. Vitals: Stable.  Blood pressure 108/66.  Pulse 82/minute.  Afebrile. HEENT: Normal. Abdomen: Soft gravid uterus; no tenderness in any of the quadrants.  No flank tenderness. No pedal edema.  Labs Hemoglobin 8.5, hematocrit 27.4, WBC 12.1, platelets 365.  Mid-trimester vaginal bleeding -Patient showed me her bleeding in the toilet bowl and on the pad.  She passed minimal clots and this is consistent with moderate vaginal bleeding. -I counseled the patient on the causes of vaginal bleeding in pregnancy.  She does not seem to have local causes or placenta previa.  Most likely cause of vaginal bleeding is placental abruption that may not be detected by ultrasound. -History of abruption increases the risk of recurrent abruption.  Serial ultrasound may detect evolving abruption. -I discussed the benefit of antenatal corticosteroids for fetal pulmonary maturity. - Patient is highly likely to have emergency cesarean delivery if vaginal bleeding is severe and prolonged. - I discussed inpatient management.  Because of her recurrent episodes of vaginal bleeding, I recommend prolonged hospitalization.  She should be in hospital at least for 7 to 10 days.  Discharge should only be considered if she does not have any bleeding for more than 72 hours. - Patient was made to understand that if she returns to the hospital with vaginal bleeding after discharge, she will stay until delivery.  Patient is keen on having bilateral tubal sterilization.  Based on her insurance, she may require the consent form to be signed 72 hours before emergency delivery. Mode of delivery: Patient goes into active labor vaginal delivery may be safely attempted.  History of gestational diabetes increases the risk of recurrent gestational diabetes.  Patient should wait for 7 to 10 days before screening  for diabetes since Betamethasone  causes hyperglycemia.  Patient received RhoGAM earlier this  month and repeat RhoGAM should not be given for at least 12 weeks.  Cell free fetal DNA screening Kristen Padilla) has a greater detection rate for Rh positive fetus.   Recommendations -Continue inpatient management for 7 to 10 days. Discharge should not be considered unless the patient stops bleeding for more than 72 to 96 hours. -Correction of anemia. Iron transfusion may be given if the patient does not tolerate oral iron. -Blood should be available at short notice. - Consent for emergency cesarean delivery and bilateral tubal ligation. - Complete antenatal corticosteroid course. - Defer GDM screening for at least 7 to 10 days. - NICU consultation. - Weekly ultrasound. - Vaginal delivery can be safely attempted if patient goes into active labor by that cephalic presentation persists.   Thank you for consultation.  If you have any questions or concerns, please contact me the [facility].  Consultation including face-to-face counseling 45 minutes.

## 2023-12-25 NOTE — H&P (Signed)
 Kristen Padilla is a 33 y.o. female, [phone], IUP at 24.1 weeks, presenting for admission to [facility]BS for vaginal bleeding in second trimester. Bleeding started approx 1 month ago on and off, denies intercourse, been to MAU 3 times in last month, US  remain unremarkable, no abruptions noted, but has a h/o abruption at 31 weeks. Pt did receive rhogam for [facility]- on December 04 2023 with ccob. Pt endorses covers pack front to pack every 4 hours. Blood noted on speculum exam in MAU. Starting hgb was 8.5, asymptomatic, pt appears pale. At times has cxt, but denies any now. Pt endorse + Fm. Denies vaginal leakage. +FM. US  today with EFW was 1.12lbs, 86%, transverse, 140, AFI wnl, anterior placenta.   Prenatal Problem: history of PTSD/SI Attempt with pills OD/depression (After both deliveries PPD, SI attempt 2014 (Ibuprophen) and 2021 (tylenol  and Nyquil).  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  Nutritional labs at [facility].  Follow growth.) iron deficiency (Noted at [facility], rx'd Accufer, recheck iron studies in 6 weeks.  If still deficient, plan hematology referral.) past pregnancy history of cesarean section (2023, 31 weeks due to abruption) past pregnancy history of gestational diabetes mellitus (Last pregnancy--Elevated 1 hour, with elevated CBGs on testing, deferred 3 hour, failed 1 hour during hospitalization. Will ikely need 3HGTT in pt)  past pregnancy history of placental abruption (2023 pregnancy, delivered at 31 weeks due to this, LTCS) past pregnancy history of shoulder dystocia and large for gestation age fetus(first baby 9+2) perinatal transient vaginal bleeding recurrent miscarriage (x 5, all early losses) RhD negative ( short cervical length in pregnancy (Noted at 21 weeks last pregnancy, used progesterone , with  improvement. Declined progesterone  this pregnancy. )   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   History of multiple spontaneous abortions 10/22/2023   Pregnancy with history of cesarean section, antepartum 10/22/2023   History of shoulder dystocia in prior pregnancy, currently pregnant 10/22/2023   History of preterm delivery, currently pregnant 10/22/2023   Adjustment disorder with depressed mood 01/15/2023   MDD (major depressive disorder) 01/14/2023   Overdose, intentional self-harm, initial encounter (HCC) 01/12/2023   Rh negative, antepartum 12/20/2021   PTSD (post-traumatic stress disorder) 06/12/2020   GAD (generalized anxiety disorder) 06/05/2020   Depression      Active Ambulatory Problems    Diagnosis Date Noted   Depression    GAD (generalized anxiety disorder) 06/05/2020   PTSD (post-traumatic stress disorder) 06/12/2020   Rh negative, antepartum 12/20/2021   Overdose, intentional self-harm, initial encounter (HCC) 01/12/2023   MDD (major depressive disorder) 01/14/2023   Adjustment disorder with depressed mood 01/15/2023   Obesity affecting pregnancy, antepartum 10/22/2023   History of prior pregnancy with short cervix, currently pregnant 10/22/2023   History of sleeve gastrectomy 10/22/2023   History of multiple spontaneous abortions 10/22/2023   Pregnancy with history of cesarean section, antepartum 10/22/2023   History of shoulder dystocia in prior pregnancy, currently pregnant 10/22/2023   History of preterm delivery, currently pregnant 10/22/2023   History of placenta abruption 11/12/2023   Resolved Ambulatory Problems    Diagnosis Date Noted   Tylenol  overdose, intentional  self-harm, initial encounter (HCC) 05/21/2020   Morbid  obesity (HCC)    Leukocytosis 05/21/2020   Overdose 05/22/2020   Second trimester bleeding 06/17/2022   Vaginal bleeding in pregnancy, third trimester 06/27/2022   Placental abruption in third trimester 06/28/2022   Bupropion  overdose 01/12/2023   Closed fracture of left distal radius 03/29/2020   Past Medical History:  Diagnosis Date   Anxiety, generalized    Miscarriage    Suicidal behavior with attempted self-injury (HCC) 2014   Suicidal behavior with attempted self-injury (HCC) 2022   UTI (urinary tract infection)       Medications Prior to Admission  Medication Sig Dispense Refill Last Dose/Taking   cyclobenzaprine  (FLEXERIL ) 5 MG tablet Take 1 tablet (5 mg total) by mouth 3 (three) times daily as needed for muscle spasms. (Patient not taking: Reported on 12/02/2023) 20 tablet 0    metroNIDAZOLE  (FLAGYL ) 500 MG tablet Take 1 tablet (500 mg total) by mouth 2 (two) times daily. (Patient not taking: Reported on 12/23/2023) 14 tablet 0     Past Medical History:  Diagnosis Date   Anxiety, generalized    Bupropion  overdose 01/12/2023   Wellbutrin  OD suicide attempt.     Closed fracture of left distal radius 03/29/2020   Added automatically from request for surgery [id number]     Depression    Leukocytosis 05/21/2020   Miscarriage    Morbid obesity (HCC)    Overdose 05/22/2020   Placental abruption in third trimester 06/28/2022   Second trimester bleeding 06/17/2022   Suicidal behavior with attempted self-injury (HCC) 2014   ibuprofen  OD   Suicidal behavior with attempted self-injury (HCC) 2022   tylenol  and NyQuil OD   Tylenol  overdose, intentional self-harm, initial encounter (HCC) 05/21/2020   UTI (urinary tract infection)    Vaginal bleeding in pregnancy, third trimester 06/27/2022     No current facility-administered medications on file prior to encounter.   Current Outpatient Medications on File Prior to Encounter  Medication Sig Dispense Refill   cyclobenzaprine   (FLEXERIL ) 5 MG tablet Take 1 tablet (5 mg total) by mouth 3 (three) times daily as needed for muscle spasms. (Patient not taking: Reported on 12/02/2023) 20 tablet 0   metroNIDAZOLE  (FLAGYL ) 500 MG tablet Take 1 tablet (500 mg total) by mouth 2 (two) times daily. (Patient not taking: Reported on 12/23/2023) 14 tablet 0     Allergies  Allergen Reactions   Progesterone  Swelling    Vaginal swelling when taken vaginally   Aripiprazole Rash   Prenatal Vitamins Rash    States she got a rash from taking the prenatal the hospital offers    Sulfa Antibiotics Rash    OB History     Gravida  8   Para  2   Term  1   Preterm  1   AB  5   Living  2      SAB  5   IAB      Ectopic      Multiple  0   Live Births  2        Obstetric Comments  2023 breech, partial abruption        Past Medical History:  Diagnosis Date   Anxiety, generalized    Bupropion  overdose 01/12/2023   Wellbutrin  OD suicide attempt.     Closed fracture of left distal radius 03/29/2020   Added automatically from request for surgery [id number]     Depression    Leukocytosis 05/21/2020   Miscarriage  Morbid obesity (HCC)    Overdose 05/22/2020   Placental abruption in third trimester 06/28/2022   Second trimester bleeding 06/17/2022   Suicidal behavior with attempted self-injury (HCC) 2014   ibuprofen  OD   Suicidal behavior with attempted self-injury (HCC) 2022   tylenol  and NyQuil OD   Tylenol  overdose, intentional self-harm, initial encounter (HCC) 05/21/2020   UTI (urinary tract infection)    Vaginal bleeding in pregnancy, third trimester 06/27/2022   Past Surgical History:  Procedure Laterality Date   CESAREAN SECTION N/A 06/29/2022   Procedure: CESAREAN SECTION;  Surgeon: Gloriann Chick, MD;  Location: [facility] LD ORS;  Service: Obstetrics;  Laterality: N/A;   HERNIA REPAIR     LAPAROSCOPIC GASTRIC SLEEVE RESECTION     Repair of displaced fracture of the left radius  03/2020   Family History:  family history includes Depression in her mother; Diabetes in her mother; Heart attack in her mother; Hypertension in her father and mother. Social History:  reports that she has never smoked. She has never used smokeless tobacco. She reports that she does not drink alcohol and does not use drugs.   Prenatal Transfer Tool  Maternal Diabetes: No Genetic Screening: Normal Maternal Ultrasounds/Referrals: Normal Fetal Ultrasounds or other Referrals:  Referred to Materal Fetal Medicine  Maternal Substance Abuse:  No Significant Maternal Medications:  None Significant Maternal Lab Results: Rh negative and Other: GBS pending.   ROS:  Review of Systems  Constitutional:  Negative for chills and fever.  Gastrointestinal:  Positive for abdominal pain (Cramping 48 hours ago).  Genitourinary:  Positive for vaginal bleeding and vaginal discharge.  All other systems reviewed and are negative.  Physical Exam: BP 108/66 (BP Location: Left Arm)   Pulse 82   Temp 97.9 F (36.6 C) (Oral)   Resp 18   Ht 5' 2 (1.575 m)   Wt 82.7 kg   LMP 07/03/2023 (Approximate)   SpO2 100%   BMI 33.34 kg/m     Physical Exam Vitals and nursing note reviewed.  Constitutional:      Appearance: Normal appearance.  HENT:     Head: Normocephalic and atraumatic.     Nose: No congestion or rhinorrhea.     Mucous membranes-lips: pale Eyes:     Extraocular Movements: Extraocular movements intact.    Cardiovascular:     Rate and Rhythm: Normal rate.  Pulmonary:     Effort: Pulmonary effort is normal.  Abdominal:     Palpations: Abdomen is soft.     Tenderness: There is no abdominal tenderness.  Genitourinary:    Comments: GU exam showing blood on inner thighs, speculum introduced and clots fell into the speculum, cervix with no visible friability, clots noted coming through the cervix Musculoskeletal:        General: Normal range of motion.     Cervical back: Normal range of motion.  Skin:    General: Skin  is warm.     Capillary Refill: Capillary refill takes less than 2 seconds.  Neurological:     General: No focal deficit present.     Mental Status: She is alert.     Cranial Nerves: No cranial nerve deficit.  Psychiatric:        Mood and Affect: Mood normal.        Behavior: Behavior normal.     NST: FHR baseline 140 bpm, Variability: moderate/minimal at times, Accelerations:present, Decelerations:  Absent= Cat 1-2/Reactive for gestational age UC:   none SVE:   Dilation: 1 Exam  by:: Dr. Ilean, vertex verified by fetal sutures.  Leopold's: Position transverse fetal head to maternal left, EFW 1.12lbs via leopold's.   Labs: Results for orders placed or performed during the hospital encounter of 12/25/23 (from the past 24 hours)  Type and screen [facility]     Status: None (Preliminary result)   Collection Time: 12/25/23 11:57 AM  Result Value Ref Range   ABO/RH(D) O NEG    Antibody Screen NEG    Sample Expiration      12/28/2023,2359 Performed at [facility], [address] [place], [place] [address]    Unit Number [id number]    Blood Component Type RED CELLS,LR    Unit division 00    Status of Unit ALLOCATED    Transfusion Status OK TO TRANSFUSE    Crossmatch Result Compatible    Unit Number [id number]    Blood Component Type RED CELLS,LR    Unit division 00    Status of Unit ALLOCATED    Transfusion Status OK TO TRANSFUSE    Crossmatch Result Compatible   CBC     Status: Abnormal   Collection Time: 12/25/23 11:58 AM  Result Value Ref Range   WBC 12.1 (H) 4.0 - 10.5 K/uL   RBC 3.68 (L) 3.87 - 5.11 MIL/uL   Hemoglobin 8.5 (L) 12.0 - 15.0 g/dL   HCT 72.5 (L) 63.9 - 53.9 %   MCV 74.5 (L) 80.0 - 100.0 fL   MCH 23.1 (L) 26.0 - 34.0 pg   MCHC 31.0 30.0 - 36.0 g/dL   RDW 84.8 88.4 - 84.4 %   Platelets 365 150 - 400 K/uL   nRBC 0.0 0.0 - 0.2 %  Wet prep, genital     Status: None   Collection Time: 12/25/23 12:43 PM   Specimen: PATH  Cytology Urine  Result Value Ref Range   Yeast Wet Prep HPF POC NONE SEEN NONE SEEN   Trich, Wet Prep NONE SEEN NONE SEEN   Clue Cells Wet Prep HPF POC NONE SEEN NONE SEEN   WBC, Wet Prep HPF POC <10 <10   Sperm NONE SEEN     Imaging:  US  MFM OB LIMITED Result Date: 12/12/2023 ----------------------------------------------------------------------  OBSTETRICS REPORT                       (Signed Final 12/12/2023 09:08 am) ---------------------------------------------------------------------- Patient Info  ID #:       [id number]                          D.O.B.:  May 04, 1991 (32 yrs)(F)  Name:       Kristen Padilla               Visit Date: 12/11/2023 05:44 pm              [facility] ---------------------------------------------------------------------- Performed By  Attending:        Steffan Keys MD         Ref. Address:     [facility]  Performed By:     Burnard LITTIE Custard          Secondary Phy.:   [facility] MAU/Triage                    RDMS  Referred By:      NORLEEN GAILS  Location[facility]                    CRESENZO MD                              [facility] ---------------------------------------------------------------------- Orders  #  Description                           Code        Ordered By  1  US  MFM OB LIMITED                     76815.01    [facility]  2  US  MFM OB TRANSVAGINAL                23182.7     NORLEEN ROVER ----------------------------------------------------------------------  #  Order #                     Accession #                Episode #  1  [id number]                   [id number]                 [id number]  2  [id number]                   [id number]                 [id number] ---------------------------------------------------------------------- Indications  Pelvic pain affecting pregnancy in second      O26.892  trimester  Vaginal bleeding in pregnancy, second          O46.92  trimester  Poor obstetric history: Previous preterm       O09.219  delivery (31  weeks/short cervix/partial  abruption)  [redacted] weeks gestation of pregnancy                Z3A.22 ---------------------------------------------------------------------- Fetal Evaluation  Num Of Fetuses:         1  Fetal Heart Rate(bpm):  155  Cardiac Activity:       Observed  Presentation:           Breech  Placenta:               Anterior  P. Cord Insertion:      Visualized  Amniotic Fluid  AFI FV:      Within normal limits                              Largest Pocket(cm)                              4.8  Comment:    No placental abruption or previa identified. ---------------------------------------------------------------------- OB History  Blood Type:   O-  Maternal Racial/Ethnic Group:   White  Gravidity:    8         Term:   1        Prem:   1        SAB:   5  TOP:          0  Ectopic:  0        Living: 2 ---------------------------------------------------------------------- Gestational Age  LMP:           23w 0d        Date:  07/03/23                   EDD:   04/08/24  Best:          martene 2d     Det. By:  Early Ultrasound         EDD:   04/13/24                                      (08/14/23) ---------------------------------------------------------------------- Anatomy  Cranium:               Appears normal         Stomach:                Appears normal, left                                                                        sided  Ventricles:            Appears normal         Kidneys:                Appear normal  Diaphragm:             Appears normal         Bladder:                Appears normal ---------------------------------------------------------------------- Cervix Uterus Adnexa  Cervix  Length:            4.4  cm.  Normal appearance by transvaginal scan  Uterus  No abnormality visualized.  Right Ovary  Within normal limits.  Left Ovary  Within normal limits.  Adnexa  No abnormality visualized ---------------------------------------------------------------------- Comments  This patient presented to  the MAU due to lower abdominal  cramping.  A limited ultrasound performed today shows that the fetus is  in the breech presentation.  There was normal amniotic fluid noted.  A normal-appearing anterior placenta is noted.  A transvaginal ultrasound performed today showed a cervical  length of 4.4 cm long without any signs of funneling. ----------------------------------------------------------------------                   Steffan Keys, MD Electronically Signed Final Report   12/12/2023 09:08 am ----------------------------------------------------------------------   US  MFM OB Transvaginal Result Date: 12/12/2023 ----------------------------------------------------------------------  OBSTETRICS REPORT                       (Signed Final 12/12/2023 09:08 am) ---------------------------------------------------------------------- Patient Info  ID #:       [id number]                          D.O.B.:  [birth year] (32 yrs)(F)  Name:       Kristen Padilla               Visit Date: 12/11/2023 05:44 pm  [facility] ---------------------------------------------------------------------- Performed By  Attending:        Steffan Keys MD         Ref. Address:     [facility]  Performed By:     Burnard LITTIE Custard          Secondary Phy.:   [facility] MAU/Triage                    RDMS  Referred By:      NORLEEN GAILS                 Location[facility]                    CRESENZO MD                              [facility] ---------------------------------------------------------------------- Orders  #  Description                           Code        Ordered By  1  US  MFM OB LIMITED                     76815.01    [facility]  2  US  MFM OB TRANSVAGINAL                23182.7     NORLEEN ROVER ----------------------------------------------------------------------  #  Order #                     Accession #                Episode #  1  [id number]                   [id number]                 [id number]  2  [id number]                    [id number]                 [id number] ---------------------------------------------------------------------- Indications  Pelvic pain affecting pregnancy in second      O26.892  trimester  Vaginal bleeding in pregnancy, second          O46.92  trimester  Poor obstetric history: Previous preterm       O09.219  delivery (31 weeks/short cervix/partial  abruption)  [redacted] weeks gestation of pregnancy                Z3A.22 ---------------------------------------------------------------------- Fetal Evaluation  Num Of Fetuses:         1  Fetal Heart Rate(bpm):  155  Cardiac Activity:       Observed  Presentation:           Breech  Placenta:               Anterior  P. Cord Insertion:      Visualized  Amniotic Fluid  AFI FV:      Within normal limits                              Largest Pocket(cm)  4.8  Comment:    No placental abruption or previa identified. ---------------------------------------------------------------------- OB History  Blood Type:   O-  Maternal Racial/Ethnic Group:   White  Gravidity:    8         Term:   1        Prem:   1        SAB:   5  TOP:          0       Ectopic:  0        Living: 2 ---------------------------------------------------------------------- Gestational Age  LMP:           23w 0d        Date:  07/03/23                   EDD:   04/08/24  Best:          martene 2d     Det. By:  Early Ultrasound         EDD:   04/13/24                                      (08/14/23) ---------------------------------------------------------------------- Anatomy  Cranium:               Appears normal         Stomach:                Appears normal, left                                                                        sided  Ventricles:            Appears normal         Kidneys:                Appear normal  Diaphragm:             Appears normal         Bladder:                Appears normal ---------------------------------------------------------------------- Cervix Uterus Adnexa   Cervix  Length:            4.4  cm.  Normal appearance by transvaginal scan  Uterus  No abnormality visualized.  Right Ovary  Within normal limits.  Left Ovary  Within normal limits.  Adnexa  No abnormality visualized ---------------------------------------------------------------------- Comments  This patient presented to the MAU due to lower abdominal  cramping.  A limited ultrasound performed today shows that the fetus is  in the breech presentation.  There was normal amniotic fluid noted.  A normal-appearing anterior placenta is noted.  A transvaginal ultrasound performed today showed a cervical  length of 4.4 cm long without any signs of funneling. ----------------------------------------------------------------------                   Steffan Keys, MD Electronically Signed Final Report   12/12/2023 09:08 am ----------------------------------------------------------------------   US  MFM OB FOLLOW UP Result Date: 12/02/2023 ----------------------------------------------------------------------  OBSTETRICS REPORT                       (  Signed Final 12/02/2023 01:32 pm) ---------------------------------------------------------------------- Patient Info  ID #:       [id number]                          D.O.B.:  [birth year] (32 yrs)(F)  Name:       Kristen Padilla               Visit Date: 12/02/2023 11:39 am              [facility] ---------------------------------------------------------------------- Performed By  Attending:        Fredia Fresh MD        Ref. Address:     [address]                                                             [address]                                                             [place]                                                             [address]  Performed By:     Elenor Edu BS      [facility]                                 [facility]  Referred By:      Marlis Leash                    OB GYN ---------------------------------------------------------------------- Orders  #  Description                           Code        Ordered By  1  US  MFM OB FOLLOW UP  23183.98    BURK SCHAIBLE  2  US  MFM OB TRANSVAGINAL                23182.7     DELORA SMALLER ----------------------------------------------------------------------  #  Order #                     Accession #                Episode #  1  [id number]                   [id number]                 [id number]  2  [id number]                   [id number]                 [id number] ---------------------------------------------------------------------- Indications  Vaginal bleeding in pregnancy, second          O46.92  trimester  Encounter for cervical length                  Z36.86  [redacted] weeks gestation of pregnancy                Z3A.21  Obesity complicating pregnancy, second         O99.212  trimester (BMI 34)  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)  History of cesarean delivery, currently        O34.219  pregnant  Poor obstetrical history (shoulder dystocia)   O09.299  Rh negative state in antepartum                O36.0190  Pregnancy complicated by previous gastric      O99.842  sleeve, antepartum, second trimester  Poor obstetric history: Previous preterm       O09.219  delivery (31 weeks/short cervix/partial  abruption)  Antenatal follow-up for nonvisualized fetal    Z36.2  anatomy ---------------------------------------------------------------------- Fetal Evaluation  Num Of Fetuses:         1  Fetal Heart Rate(bpm):  140  Cardiac Activity:       Observed  Presentation:           Cephalic  Placenta:               Anterior  P. Cord Insertion:      Previously seen  Amniotic Fluid  AFI FV:      Within normal limits  ---------------------------------------------------------------------- Biometry  BPD:      50.2  mm     G. Age:  21w 1d         57  %    CI:        73.94   %    70 - 86                                                          FL/HC:      19.8   %    15.9 - 20.3  HC:      185.4  mm     G. Age:  20w 6d         35  %  HC/AC:      1.10        1.06 - 1.25  AC:      168.2  mm     G. Age:  21w 6d         70  %    FL/BPD:     73.1   %  FL:       36.7  mm     G. Age:  21w 5d         65  %    FL/AC:      21.8   %    20 - 24  CER:      21.9  mm     G. Age:  20w 4d         48  %  LV:        6.2  mm  CM:        4.4  mm  Est. FW:     440  gm           1 lb     79  % ---------------------------------------------------------------------- OB History  Blood Type:   O-  Maternal Racial/Ethnic Group:   White  Gravidity:    8         Term:   1        Prem:   1        SAB:   5  TOP:          0       Ectopic:  0        Living: 2 ---------------------------------------------------------------------- Gestational Age  LMP:           21w 5d        Date:  07/03/23                   EDD:   04/08/24  U/S Today:     21w 3d                                        EDD:   04/10/24  Best:          felton 0d     Det. By:  Early Ultrasound         EDD:   04/13/24                                      (08/14/23) ---------------------------------------------------------------------- Targeted Anatomy  Central Nervous System  Calvarium/Cranial V.:  Appears normal         Cereb./Vermis:          Appears normal  Cavum:                 Appears normal         Cisterna Magna:         Appears normal  Lateral Ventricles:    Appears normal         Midline Falx:           Appears normal  Choroid Plexus:        Appears normal  Spine  Cervical:              Previously seen        Sacral:  Previously seen  Thoracic:              Previously seen        Shape/Curvature:        Previously seen  Lumbar:                Previously seen  Head/Neck  Lips:                   Appears normal         Profile:                Not well visualized  Neck:                  Previously seen        Orbits/Eyes:            Appears normal  Nuchal Fold:           Not applicable         Mandible:               Appears normal  Nasal Bone:            Not well visualized    Maxilla:                Not well visualized  Thorax  4 Chamber View:        Appears normal         Interventr. Septum:     Appears normal  Cardiac Rhythm:        Normal                 Cardiac Axis:           Normal  Cardiac Situs:         Appears normal         Diaphragm:              Previously seen  Rt Outflow Tract:      Appears normal         3 Vessel View:          Appears normal  Lt Outflow Tract:      Appears normal         3 V Trachea View:       Appears normal  Aortic Arch:           Appears normal         IVC:                    Appears normal  Ductal Arch:           Appears normal         Crossing:               Appears normal  SVC:                   Appears normal  Abdomen  Cord Insertion:        Previously seen        Lt Kidney:              Appears normal  Situs:                 Appears normal         Rt Kidney:              Appears normal  Stomach:  Appears normal         Bladder:                Appears normal  Extremities  Lt Humerus:            Previously seen        Lt Femur:               Previously seen  Rt Humerus:            Previously seen        Rt Femur:               Previously seen  Lt Forearm:            Previously seen        Lt Lower Leg:           Previously seen  Rt Forearm:            Previously seen        Rt Lower Leg:           Previously seen  Lt Hand:               Open hand nml          Lt Foot:                Previously seen  Rt Hand:               Previously seen        Rt Foot:                Previously seen  Other  Umbilical Cord:        Previously seen        Genitalia:              Previously seen  Comment:     Technically difficult due to fetal position.  ---------------------------------------------------------------------- Cervix Uterus Adnexa  Cervix  Length:            3.7  cm.  Normal appearance by transvaginal scan  Uterus  No abnormality visualized.  Right Ovary  Within normal limits.  Left Ovary  Within normal limits.  Cul De Sac  No free fluid seen.  Adnexa  No abnormality visualized ---------------------------------------------------------------------- Impression  G8 P1152 at 21-weeks' gestation and is here for completion  of fetal anatomy and cervical length measurement.  Her  pregnancy is dated by first trimester ultrasound performed at  your office and her EDD is established at 04/13/2024.  Patient reports she had vaginal bleeding yesterday.  She  does not give history of sexual intercourse.  No history of  abdominal pain.  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  She had a term vaginal delivery followed by a  preterm cesarean delivery in 2023 at [redacted] weeks gestation  because of heavy vaginal bleeding and nonreassuring fetal  heart tracings.  Both her children are in good health.  Ultrasound  Fetal growth is appropriate for gestational age.  Amniotic fluid  is normal good fetal activity seen.  Fetal anatomical survey  was completed and appears normal.  Placenta appears  normal with no evidence of retroplacental hemorrhage.  On transvaginal ultrasound, the cervix measures 3.7 cm.  No  shortening or funneling was seen on transfundal pressure.  No evidence of previa.  I reassured the patient of the findings.  Ultrasound has  limitations in diagnosing placental abruption.  I counseled the  patient that recurrent vaginal bleeding increases the risk of  preterm labor/delivery.  Her blood type is Rh negative. Patient reports that on  Panorama testing, the fetus was found to be Rh positive.  She received RhoGAM about 15 to 16 weeks ago.  I recommended that she take RhoGAM now to prevent  alloimmunization.  I discussed with Orie Bonus, CNM, who will be calling our  patient today and give Rhogam at her office. ---------------------------------------------------------------------- Recommendations  - An appointment was made for her to return in 4 weeks for  fetal growth assessment (indication-vaginal bleeding). ----------------------------------------------------------------------                 Fredia Fresh, MD Electronically Signed Final Report   12/02/2023 01:32 pm ----------------------------------------------------------------------   US  MFM OB Transvaginal Result Date: 12/02/2023 ----------------------------------------------------------------------  OBSTETRICS REPORT                       (Signed Final 12/02/2023 01:32 pm) ---------------------------------------------------------------------- Patient Info  ID #:       [id number]                          D.O.B.:  06-07-1991 (32 yrs)(F)  Name:       Kristen Padilla               Visit Date: 12/02/2023 11:39 am              [facility] ---------------------------------------------------------------------- Performed By  Attending:        Fredia Fresh MD        Ref. Address:     [address]                                                             [address]                                                             [place]                                                             [address]  Performed By:     Elenor Edu BS      [facility]  Referred By:      [facility] ---------------------------------------------------------------------- Orders  #  Description                           Code        Ordered By  1  US  MFM OB FOLLOW UP                   76816.01    BURK SCHAIBLE  2  US  MFM OB TRANSVAGINAL                 23182.7     BURK SCHAIBLE ----------------------------------------------------------------------  #  Order #                     Accession #                Episode #  1  [id number]                   [id number]                 [id number]  2  [id number]                   [id number]                 [id number] ---------------------------------------------------------------------- Indications  Vaginal bleeding in pregnancy, second          O46.92  trimester  Encounter for cervical length                  Z36.86  [redacted] weeks gestation of pregnancy                Z3A.21  Obesity complicating pregnancy, second         O99.212  trimester (BMI 34)  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)  History of cesarean delivery, currently        O34.219  pregnant  Poor obstetrical history (shoulder dystocia)   O09.299  Rh negative state in antepartum                O36.0190  Pregnancy complicated by previous gastric      O99.842  sleeve, antepartum, second trimester  Poor obstetric history: Previous preterm       O09.219  delivery (31 weeks/short cervix/partial  abruption)  Antenatal follow-up for nonvisualized fetal    Z36.2  anatomy ---------------------------------------------------------------------- Fetal Evaluation  Num Of Fetuses:         1  Fetal Heart Rate(bpm):  140  Cardiac Activity:       Observed  Presentation:           Cephalic  Placenta:               Anterior  P. Cord Insertion:      Previously seen  Amniotic Fluid  AFI FV:  Within normal limits ---------------------------------------------------------------------- Biometry  BPD:      50.2  mm     G. Age:  21w 1d         57  %    CI:        73.94   %    70 - 86                                                          FL/HC:      19.8   %    15.9 - 20.3  HC:      185.4  mm     G. Age:  20w 6d         35  %    HC/AC:      1.10        1.06 - 1.25  AC:      168.2  mm     G. Age:  21w 6d         70  %    FL/BPD:     73.1   %  FL:        36.7  mm     G. Age:  21w 5d         65  %    FL/AC:      21.8   %    20 - 24  CER:      21.9  mm     G. Age:  20w 4d         48  %  LV:        6.2  mm  CM:        4.4  mm  Est. FW:     440  gm           1 lb     79  % ---------------------------------------------------------------------- OB History  Blood Type:   O-  Maternal Racial/Ethnic Group:   White  Gravidity:    8         Term:   1        Prem:   1        SAB:   5  TOP:          0       Ectopic:  0        Living: 2 ---------------------------------------------------------------------- Gestational Age  LMP:           21w 5d        Date:  07/03/23                   EDD:   04/08/24  U/S Today:     21w 3d                                        EDD:   04/10/24  Best:          felton 0d     Det. By:  Early Ultrasound         EDD:   04/13/24                                      (  08/14/23) ---------------------------------------------------------------------- Targeted Anatomy  Central Nervous System  Calvarium/Cranial V.:  Appears normal         Cereb./Vermis:          Appears normal  Cavum:                 Appears normal         Cisterna Magna:         Appears normal  Lateral Ventricles:    Appears normal         Midline Falx:           Appears normal  Choroid Plexus:        Appears normal  Spine  Cervical:              Previously seen        Sacral:                 Previously seen  Thoracic:              Previously seen        Shape/Curvature:        Previously seen  Lumbar:                Previously seen  Head/Neck  Lips:                  Appears normal         Profile:                Not well visualized  Neck:                  Previously seen        Orbits/Eyes:            Appears normal  Nuchal Fold:           Not applicable         Mandible:               Appears normal  Nasal Bone:            Not well visualized    Maxilla:                Not well visualized  Thorax  4 Chamber View:        Appears normal         Interventr. Septum:     Appears normal  Cardiac  Rhythm:        Normal                 Cardiac Axis:           Normal  Cardiac Situs:         Appears normal         Diaphragm:              Previously seen  Rt Outflow Tract:      Appears normal         3 Vessel View:          Appears normal  Lt Outflow Tract:      Appears normal         3 V Trachea View:       Appears normal  Aortic Arch:           Appears normal         IVC:  Appears normal  Ductal Arch:           Appears normal         Crossing:               Appears normal  SVC:                   Appears normal  Abdomen  Cord Insertion:        Previously seen        Lt Kidney:              Appears normal  Situs:                 Appears normal         Rt Kidney:              Appears normal  Stomach:               Appears normal         Bladder:                Appears normal  Extremities  Lt Humerus:            Previously seen        Lt Femur:               Previously seen  Rt Humerus:            Previously seen        Rt Femur:               Previously seen  Lt Forearm:            Previously seen        Lt Lower Leg:           Previously seen  Rt Forearm:            Previously seen        Rt Lower Leg:           Previously seen  Lt Hand:               Open hand nml          Lt Foot:                Previously seen  Rt Hand:               Previously seen        Rt Foot:                Previously seen  Other  Umbilical Cord:        Previously seen        Genitalia:              Previously seen  Comment:     Technically difficult due to fetal position. ---------------------------------------------------------------------- Cervix Uterus Adnexa  Cervix  Length:            3.7  cm.  Normal appearance by transvaginal scan  Uterus  No abnormality visualized.  Right Ovary  Within normal limits.  Left Ovary  Within normal limits.  Cul De Sac  No free fluid seen.  Adnexa  No abnormality visualized ---------------------------------------------------------------------- Impression  G8 P1152 at 21-weeks'  gestation and is here for completion  of fetal anatomy and cervical length measurement.  Her  pregnancy is dated by first trimester ultrasound performed at  your office and her EDD is established at 04/13/2024.  Patient reports she had vaginal bleeding yesterday.  She  does not give history of sexual intercourse.  No history of  abdominal pain.  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  She had a term vaginal delivery followed by a  preterm cesarean delivery in 2023 at [redacted] weeks gestation  because of heavy vaginal bleeding and nonreassuring fetal  heart tracings.  Both her children are in good health.  Ultrasound  Fetal growth is appropriate for gestational age.  Amniotic fluid  is normal good fetal activity seen.  Fetal anatomical survey  was completed and appears normal.  Placenta appears  normal with no evidence of retroplacental hemorrhage.  On transvaginal ultrasound, the cervix measures 3.7 cm.  No  shortening or funneling was seen on transfundal pressure.  No evidence of previa.  I reassured the patient of the findings.  Ultrasound has  limitations in diagnosing placental abruption.  I counseled the  patient that recurrent vaginal bleeding increases the risk of  preterm labor/delivery.  Her blood type is Rh negative. Patient reports that on  Panorama testing, the fetus was found to be Rh positive.  She received RhoGAM about 15 to 16 weeks ago.  I recommended that she take RhoGAM now to prevent  alloimmunization.  I discussed with Orie Bonus, CNM, who will be calling our  patient today and give Rhogam at her office. ---------------------------------------------------------------------- Recommendations  - An appointment was made for her to return in 4 weeks for  fetal growth assessment (indication-vaginal bleeding). ----------------------------------------------------------------------                 Fredia Fresh, MD Electronically Signed Final Report   12/02/2023 01:32 pm  ----------------------------------------------------------------------    MAU Course: Orders Placed This Encounter  Procedures   Culture, beta strep (group b only)   Wet prep, genital   Urine Culture   US  MFM OB FOLLOW UP   CBC   Diet regular Room service appropriate? Yes; Fluid consistency: Thin   Notify physician (specify)   Vital signs   Defer vaginal exam for vaginal bleeding or PROM <37 weeks   Apply Antepartum Care Plan   Initiate Oral Care Protocol   Initiate Carrier Fluid Protocol   SCDs   Informed Consent Details: Physician/Practitioner Attestation; Transcribe to consent form and obtain patient signature   Fetal monitoring   Bed rest with bathroom privileges   Informed Consent Details: Physician/Practitioner Attestation; Transcribe to consent form and obtain patient signature   Full code   Consult to neonatology Reason for Consult? vaginal bleeding  at 24.1 weeks   Consult to Maternal Fetal Care   Type and screen [facility]   Prepare RBC (crossmatch)   Admit to Inpatient (patient's expected length of stay will be greater than 2 midnights or inpatient only procedure)   Meds ordered this encounter  Medications   acetaminophen  (TYLENOL ) tablet 650 mg   docusate sodium  (COLACE) capsule 100 mg   sodium chloride  flush (NS) 0.9 % injection 3-10 mL   sodium chloride  flush (NS) 0.9 % injection 3-10 mL   betamethasone  acetate-betamethasone  sodium phosphate  (CELESTONE ) injection 12 mg   0.9 %  sodium chloride  infusion (Manually program via Guardrails IV Fluids)    Assessment/Plan: Kristen Padilla is a 33 y.o. female, [phone], IUP at 24.1 weeks, presenting for admission to OBS for vaginal bleeding in second trimester. Bleeding started approx 1 month ago on and off, denies intercourse, been to MAU 3 times in last month,  US  remain unremarkable, no abruptions noted, but has a h/o abruption at 31 weeks. Pt did receive rhogam for [facility]- on December 04 2023  with ccob. Pt endorses covers pack front to pack every 4 hours. Blood noted on speculum exam in MAU. Starting hgb was 8.5, asymptomatic, pt appears pale. At times has cxt, but denies any now. Pt endorse + Fm. Denies vaginal leakage. +FM. US  today with EFW was 1.12lbs, 86%, transverse, 140, AFI wnl, anterior placenta.   Prenatal Problem: history of PTSD/SI Attempt with pills OD/depression (After both deliveries PPD, SI attempt 2014 (Ibuprophen) and 2021 (tylenol  and Nyquil).  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  Nutritional labs at [facility].  Follow growth.) iron deficiency (Noted at NOB, rx'd Accufer, recheck iron studies in 6 weeks.  If still deficient, plan hematology referral.) past pregnancy history of cesarean section (2023, 31 weeks due to abruption) past pregnancy history of gestational diabetes mellitus (Last pregnancy--Elevated 1 hour, with elevated CBGs on testing, deferred 3 hour, failed 1 hour during hospitalization. 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 Declined progesterone  this pregnancy. )  FWB: Reactive for gestational age.   Plan: Admit to Antepartum per consult with Dr Henry Routine [facility] ordered GBS Pending T&C 2 units for emergency UC/GC/C/Wet Prep all pending.  Bedrest with bathroom privileges. SCD EFW OB US  ordered.  Continuous fetal monitoring Antenatal diet BMZ x2 MFM and NICU consults ordered.   Anticipate stay until 24-48 hours bleeding free.  IDA: may require IV iron transfusion   Kristen Padilla  CNM, FNP-C, PMHNP-BC  [id number]  [place], [place] [address]  Cell: [phone]  Office Phone: [phone] Fax: [phone] 12/25/2023  1:46 PM

## 2023-12-26 ENCOUNTER — Inpatient Hospital Stay (HOSPITAL_BASED_OUTPATIENT_CLINIC_OR_DEPARTMENT_OTHER)

## 2023-12-26 ENCOUNTER — Other Ambulatory Visit: Payer: Self-pay

## 2023-12-26 ENCOUNTER — Encounter (HOSPITAL_COMMUNITY): Payer: Self-pay | Admitting: Obstetrics and Gynecology

## 2023-12-26 DIAGNOSIS — E669 Obesity, unspecified: Secondary | ICD-10-CM

## 2023-12-26 DIAGNOSIS — O09212 Supervision of pregnancy with history of pre-term labor, second trimester: Secondary | ICD-10-CM

## 2023-12-26 DIAGNOSIS — O99212 Obesity complicating pregnancy, second trimester: Secondary | ICD-10-CM | POA: Diagnosis not present

## 2023-12-26 DIAGNOSIS — O34219 Maternal care for unspecified type scar from previous cesarean delivery: Secondary | ICD-10-CM

## 2023-12-26 DIAGNOSIS — O4692 Antepartum hemorrhage, unspecified, second trimester: Secondary | ICD-10-CM | POA: Diagnosis not present

## 2023-12-26 DIAGNOSIS — Z3A24 24 weeks gestation of pregnancy: Secondary | ICD-10-CM

## 2023-12-26 LAB — CBC WITH DIFFERENTIAL/PLATELET
Abs Immature Granulocytes: 0.21 10*3/uL — ABNORMAL HIGH (ref 0.00–0.07)
Basophils Absolute: 0 10*3/uL (ref 0.0–0.1)
Basophils Relative: 0 %
Eosinophils Absolute: 0 10*3/uL (ref 0.0–0.5)
Eosinophils Relative: 0 %
HCT: 27.2 % — ABNORMAL LOW (ref 36.0–46.0)
Hemoglobin: 8.6 g/dL — ABNORMAL LOW (ref 12.0–15.0)
Immature Granulocytes: 1 %
Lymphocytes Relative: 7 %
Lymphs Abs: 1.2 10*3/uL (ref 0.7–4.0)
MCH: 23.8 pg — ABNORMAL LOW (ref 26.0–34.0)
MCHC: 31.6 g/dL (ref 30.0–36.0)
MCV: 75.3 fL — ABNORMAL LOW (ref 80.0–100.0)
Monocytes Absolute: 0.8 10*3/uL (ref 0.1–1.0)
Monocytes Relative: 5 %
Neutro Abs: 15.5 10*3/uL — ABNORMAL HIGH (ref 1.7–7.7)
Neutrophils Relative %: 87 %
Platelets: 381 10*3/uL (ref 150–400)
RBC: 3.61 MIL/uL — ABNORMAL LOW (ref 3.87–5.11)
RDW: 14.7 % (ref 11.5–15.5)
WBC: 17.7 10*3/uL — ABNORMAL HIGH (ref 4.0–10.5)
nRBC: 0 % (ref 0.0–0.2)

## 2023-12-26 LAB — PREPARE RBC (CROSSMATCH)

## 2023-12-26 LAB — URINE CULTURE: Culture: NO GROWTH

## 2023-12-26 LAB — KLEIHAUER-BETKE STAIN
# Vials RhIg: 1
Fetal Cells %: 0 %
Quantitation Fetal Hemoglobin: 0 mL

## 2023-12-26 LAB — COMPREHENSIVE METABOLIC PANEL WITH GFR
ALT: 11 U/L (ref 0–44)
AST: 16 U/L (ref 15–41)
Albumin: 2.6 g/dL — ABNORMAL LOW (ref 3.5–5.0)
Alkaline Phosphatase: 44 U/L (ref 38–126)
Anion gap: 11 (ref 5–15)
BUN: 6 mg/dL (ref 6–20)
CO2: 20 mmol/L — ABNORMAL LOW (ref 22–32)
Calcium: 8.8 mg/dL — ABNORMAL LOW (ref 8.9–10.3)
Chloride: 105 mmol/L (ref 98–111)
Creatinine, Ser: 0.5 mg/dL (ref 0.44–1.00)
GFR, Estimated: >60 mL/min (ref >60)
Glucose, Bld: 109 mg/dL — ABNORMAL HIGH (ref 70–99)
Potassium: 3.8 mmol/L (ref 3.5–5.1)
Sodium: 136 mmol/L (ref 135–145)
Total Bilirubin: 0.4 mg/dL (ref 0.0–1.2)
Total Protein: 6.5 g/dL (ref 6.5–8.1)

## 2023-12-26 LAB — GC/CHLAMYDIA PROBE AMP (~~LOC~~) NOT AT ARMC
Comment: NORMAL
Neisseria Gonorrhea: NEGATIVE

## 2023-12-26 MED ORDER — LACTATED RINGERS IV BOLUS
500.0000 mL | Freq: Once | INTRAVENOUS | Status: AC
  Administered 2023-12-26: 500 mL via INTRAVENOUS

## 2023-12-26 MED ORDER — MAGNESIUM HYDROXIDE 400 MG/5ML PO SUSP
30.0000 mL | Freq: Every day | ORAL | Status: DC | PRN
  Administered 2023-12-26: 30 mL via ORAL
  Filled 2023-12-26: qty 30

## 2023-12-26 MED ORDER — OXYCODONE-ACETAMINOPHEN 5-325 MG PO TABS
1.0000 | ORAL_TABLET | Freq: Once | ORAL | Status: AC
  Administered 2023-12-26: 1 via ORAL
  Filled 2023-12-26: qty 1

## 2023-12-26 NOTE — Plan of Care (Signed)

## 2023-12-26 NOTE — Progress Notes (Signed)
 Kristen Padilla is a 33 y.o. [phone] at [redacted]w[redacted]d by admitted for vaginal bleeding  in 2nd trimester and threatened preterm labor.   Subjective:  Very concerned about bleeding and fearful of another abruption as with previous pregnancy. Perceives mild abd cramping now, but less than what occurred during the night. Cramping was resolved with Tylenol  during the night.      Discussed reasons for bleeding and recommendations from MFM. Questions answered and plan for management of discomfort and of bleeding worsens or changes in fetal status.   Objective: Vitals:   12/25/23 2300 12/25/23 2355 12/26/23 0432 12/26/23 0731  BP: 112/72 108/71 106/67 118/69  Pulse: (!) 107 88 88 95  Resp: 16  17 19   Temp: 98.1 F (36.7 C)  97.8 F (36.6 C) 98 F (36.7 C)  TempSrc: Oral  Oral   SpO2: 99% 97% 98% 100%  Weight:      Height:        I/O last 3 completed shifts: In: -  Out: 60 [Blood:60]   Physical Exam: VS: Blood pressure 118/69, pulse 95, temperature 98 F (36.7 C), resp. rate 19, height 5' 2 (1.575 m), weight 82.7 kg, last menstrual period 07/03/2023, SpO2 100%, not currently breastfeeding. AAO x3, no signs of distress Cardiovascular: RRR Respiratory: Unlabored GU/GI: Abdomen gravid, non-tender, non-distended, active FM  Extremities: no edema, negative for pain, tenderness, and cords  Cervical exam:Dilation: 1 Exam by:: Dr. Ilean NST: baseline rate 145 / variability moderate / accelerations none / no decelerations TOCO: occasional irritability   Labs:   Recent Labs    12/25/23 2332 12/26/23 0531  WBC 14.1* 17.7*  HGB 8.5* 8.6*  HCT 27.3* 27.2*  PLT 363 381      Assessment / Plan: 33 y.o. [id number] @ [redacted]w[redacted]d Hx of LTCS at 31 wks for abruption and breech  Vaginal bleeding      -no signs of previa or abruption on US  from limited imaging on 12/25/23    -normal coags    -negative GC/CT    -GBS culture pending    -Maternal ABO O neg, rhogam 300 mg received  12/25/23, fetus confirmed RH positive Fetal status    -U/S 12/26/23 transverse w/ head to maternal right/ anterior placenta/ EFW 1# 12oz (798 grams, 86%)     -BMZ complete 11/2723    -NICU consult complete      -NST BID Psych/Social hx    -suicide attempt via overdose in 2021 (Tylenol  and NyQuil per ED note) and 2024 (Bupropion ) not on any meds currently IDA    -S/P IV Venofer  12/25/23 MFM recommendation -Continue inpatient management for 7 to 10 days. Discharge should not be considered unless the patient stops bleeding for more than 72 to 96 hours. -Correction of anemia. Iron  transfusion may be given if the patient does not tolerate oral iron  (completed) -Blood should be available at short notice. - Consent for emergency cesarean delivery and bilateral tubal ligation (declines sterilization for poor fetal prognosis or IUFD) - Complete antenatal corticosteroid course (completed) - Defer GDM screening for at least 7 to 10 days. - NICU consultation (completed) - Weekly ultrasound. - Vaginal delivery can be safely attempted if patient goes into active labor by that cephalic presentation persists (transverse on 11/2723)  Mercer Peal DNP, CNM 12/26/2023, 10:24 AM

## 2023-12-27 LAB — CULTURE, BETA STREP (GROUP B ONLY)

## 2023-12-27 MED ORDER — OXYCODONE-ACETAMINOPHEN 5-325 MG PO TABS: 1.0000 | ORAL_TABLET | Freq: Once | ORAL | Status: DC | PRN

## 2023-12-27 MED ORDER — OXYCODONE HCL 5 MG PO TABS
5.0000 mg | ORAL_TABLET | Freq: Once | ORAL | Status: AC | PRN
  Administered 2023-12-27: 5 mg via ORAL
  Filled 2023-12-27: qty 1

## 2023-12-27 MED ORDER — LACTATED RINGERS IV BOLUS
500.0000 mL | Freq: Once | INTRAVENOUS | Status: AC
  Administered 2023-12-27: 500 mL via INTRAVENOUS

## 2023-12-27 NOTE — Progress Notes (Signed)
 Subjective:  Doing well, no bleeding during the night, denies abd pain, does not perceive contractions, and reports active FM.   Objective: Vitals:   12/26/23 1600 12/26/23 1934 12/26/23 2349 12/26/23 2350  BP: 103/61 109/62 (!) 96/55 (!) 101/56  Pulse: 87 90 87   Resp: 16 17 18    Temp: 98.3 F (36.8 C) 98 F (36.7 C) 98.1 F (36.7 C)   TempSrc: Oral Oral Oral   SpO2: 97% 95% 94%   Weight:      Height:        I/O last 3 completed shifts: In: 500.8 [IV Piggyback:500.8] Out: 88 [Blood:88]   Physical Exam: VS: Blood pressure (!) 101/56, pulse 87, temperature 98.1 F (36.7 C), temperature source Oral, resp. rate 18, height 5' 2 (1.575 m), weight 82.7 kg, last menstrual period 07/03/2023, SpO2 94%, not currently breastfeeding. AAO x3, no signs of distress Cardiovascular: RRR Respiratory: Unlabored GU/GI: Abdomen gravid, non-tender, non-distended, active FM,  Extremities: no edema, negative for pain, tenderness, and cords  Cervical exam:Dilation: 1 Exam by:: Dr. Ilean NST: baseline rate 145 / variability moderate / accelerations absent / absent decelerations TOCO: none   Labs:   Recent Labs    12/25/23 2332 12/26/23 0531  WBC 14.1* 17.7*  HGB 8.5* 8.6*  HCT 27.3* 27.2*  PLT 363 381    Assessment / Plan: 33 y.o. G8 P1152 @ [redacted]w[redacted]d Hx of LTCS at 31 wks for abruption and breech   Vaginal bleeding      -no signs of previa or abruption on US  from limited imaging on 12/25/23    -normal coags    -negative GC/CT    -GBS culture pending    -Maternal ABO O neg, rhogam 300 mg received 12/25/23, fetus confirmed RH positive Fetal status    -U/S 12/26/23 transverse w/ head to maternal right/ anterior placenta/ EFW 1# 12oz (798 grams, 86%)     -BMZ complete 11/2723    -NICU consult complete      -NST BID Psych/Social hx    -suicide attempt via overdose in 2021 (Tylenol  and NyQuil per ED note) and 2024 (Bupropion ) not on any meds currently IDA    -S/P IV Venofer   12/25/23 MFM recommendation -Continue inpatient management for 7 to 10 days. Discharge should not be considered unless the patient stops bleeding for more than 72 to 96 hours. -Correction of anemia. Iron  transfusion may be given if the patient does not tolerate oral iron  (completed) -Blood should be available at short notice. - Consent for emergency cesarean delivery and bilateral tubal ligation (declines sterilization for poor fetal prognosis or IUFD) - Complete antenatal corticosteroid course (completed) - Defer GDM screening for at least 7 to 10 days. - NICU consultation (completed) - Weekly ultrasound. - Vaginal delivery can be safely attempted if patient goes into active labor by that cephalic presentation persists (transverse on 11/2723)   Dawnell Bryant DNP, CNM 12/27/2023, 7:17 AM

## 2023-12-27 NOTE — Plan of Care (Signed)
  Problem: Education: Goal: Knowledge of disease or condition will improve Outcome: Progressing Goal: Knowledge of the prescribed therapeutic regimen will improve Outcome: Progressing Goal: Individualized Educational Video(s) Outcome: Progressing   Problem: Clinical Measurements: Goal: Complications related to the disease process, condition or treatment will be avoided or minimized Outcome: Progressing   Problem: Health Behavior/Discharge Planning: Goal: Ability to manage health-related needs will improve Outcome: Progressing   Problem: Clinical Measurements: Goal: Ability to maintain clinical measurements within normal limits will improve Outcome: Progressing Goal: Will remain free from infection Outcome: Progressing Goal: Diagnostic test results will improve Outcome: Progressing Goal: Respiratory complications will improve Outcome: Progressing Goal: Cardiovascular complication will be avoided Outcome: Progressing   Problem: Activity: Goal: Risk for activity intolerance will decrease Outcome: Progressing   Problem: Coping: Goal: Level of anxiety will decrease Outcome: Progressing   Problem: Elimination: Goal: Will not experience complications related to bowel motility Outcome: Progressing Goal: Will not experience complications related to urinary retention Outcome: Progressing   Problem: Pain Managment: Goal: General experience of comfort will improve and/or be controlled Outcome: Progressing   Problem: Safety: Goal: Ability to remain free from injury will improve Outcome: Progressing   Problem: Skin Integrity: Goal: Risk for impaired skin integrity will decrease Outcome: Progressing

## 2023-12-28 LAB — CBC
HCT: 23 % — ABNORMAL LOW (ref 36.0–46.0)
Hemoglobin: 7.2 g/dL — ABNORMAL LOW (ref 12.0–15.0)
MCH: 23.6 pg — ABNORMAL LOW (ref 26.0–34.0)
MCHC: 31.3 g/dL (ref 30.0–36.0)
MCV: 75.4 fL — ABNORMAL LOW (ref 80.0–100.0)
Platelets: 317 10*3/uL (ref 150–400)
RBC: 3.05 MIL/uL — ABNORMAL LOW (ref 3.87–5.11)
RDW: 15.4 % (ref 11.5–15.5)
WBC: 13.8 10*3/uL — ABNORMAL HIGH (ref 4.0–10.5)
nRBC: 0.8 % — ABNORMAL HIGH (ref 0.0–0.2)

## 2023-12-28 LAB — PREPARE RBC (CROSSMATCH)

## 2023-12-28 MED ORDER — DIPHENHYDRAMINE HCL 25 MG PO CAPS
25.0000 mg | ORAL_CAPSULE | Freq: Once | ORAL | Status: AC
  Administered 2023-12-28: 25 mg via ORAL
  Filled 2023-12-28: qty 1

## 2023-12-28 MED ORDER — ONDANSETRON HCL 4 MG/2ML IJ SOLN: 4.0000 mg | Freq: Three times a day (TID) | INTRAMUSCULAR | Status: DC | PRN

## 2023-12-28 MED ORDER — SODIUM CHLORIDE 0.9% IV SOLUTION: Freq: Once | INTRAVENOUS | Status: AC

## 2023-12-28 MED ORDER — ONDANSETRON HCL 4 MG PO TABS
4.0000 mg | ORAL_TABLET | Freq: Three times a day (TID) | ORAL | Status: DC | PRN
  Administered 2023-12-28: 4 mg via ORAL
  Filled 2023-12-28: qty 1

## 2023-12-28 MED ORDER — ACETAMINOPHEN 325 MG PO TABS: 650.0000 mg | ORAL_TABLET | Freq: Once | ORAL | Status: DC

## 2023-12-28 NOTE — Plan of Care (Signed)
  Problem: Education: Goal: Knowledge of disease or condition will improve Outcome: Progressing Goal: Knowledge of the prescribed therapeutic regimen will improve Outcome: Progressing Goal: Individualized Educational Video(s) Outcome: Progressing   Problem: Clinical Measurements: Goal: Complications related to the disease process, condition or treatment will be avoided or minimized Outcome: Progressing   Problem: Health Behavior/Discharge Planning: Goal: Ability to manage health-related needs will improve Outcome: Progressing   Problem: Clinical Measurements: Goal: Ability to maintain clinical measurements within normal limits will improve Outcome: Progressing Goal: Will remain free from infection Outcome: Progressing Goal: Diagnostic test results will improve Outcome: Progressing Goal: Respiratory complications will improve Outcome: Progressing Goal: Cardiovascular complication will be avoided Outcome: Progressing   Problem: Activity: Goal: Risk for activity intolerance will decrease Outcome: Progressing   Problem: Coping: Goal: Level of anxiety will decrease Outcome: Progressing   Problem: Elimination: Goal: Will not experience complications related to bowel motility Outcome: Progressing Goal: Will not experience complications related to urinary retention Outcome: Progressing   Problem: Pain Managment: Goal: General experience of comfort will improve and/or be controlled Outcome: Progressing   Problem: Safety: Goal: Ability to remain free from injury will improve Outcome: Progressing   Problem: Skin Integrity: Goal: Risk for impaired skin integrity will decrease Outcome: Progressing

## 2023-12-28 NOTE — Progress Notes (Addendum)
 [facility] Day #3  Kristen Padilla is a 33 y.o. H1E8847 at [redacted]w[redacted]d by admitted for vaginal bleeding in 2nd trimester and threatened preterm labor.   Subjective: Reports feeling ok this morning. Had several occurrences of vaginal bleeding overnight, some small clots, not heavy but bright red. Denies cramping. Endorses + fetal movement.    Tolerating po intake / no nausea / no vomiting  Bowel habits WNL / Voiding QS  Objective: Vital signs: VS: Blood pressure (!) 100/51, pulse 84, temperature 98.2 F (36.8 C), temperature source Oral, resp. rate 17, height 5' 2 (1.575 m), weight 82.7 kg, last menstrual period 07/03/2023, SpO2 99%, not currently breastfeeding.  Labs: Results for orders placed or performed during the hospital encounter of 12/25/23 (from the past 24 hours)  CBC     Status: Abnormal   Collection Time: 12/28/23  7:33 AM  Result Value Ref Range   WBC 13.8 (H) 4.0 - 10.5 K/uL   RBC 3.05 (L) 3.87 - 5.11 MIL/uL   Hemoglobin 7.2 (L) 12.0 - 15.0 g/dL   HCT 76.9 (L) 63.9 - 53.9 %   MCV 75.4 (L) 80.0 - 100.0 fL   MCH 23.6 (L) 26.0 - 34.0 pg   MCHC 31.3 30.0 - 36.0 g/dL   RDW 84.5 88.4 - 84.4 %   Platelets 317 150 - 400 K/uL   nRBC 0.8 (H) 0.0 - 0.2 %    Physical exam:       General appearance/behavior: AAO x 4       Abdomen: non-tender       Extremities: no edema          FHR NST last night reassuring, FHT WNL for gestational age, no decels TOCO quiet  Assessment: Hospital day #3 Kristen Padilla is a 33 y.o. H1E8847 at [redacted]w[redacted]d by admitted for vaginal bleeding in 2nd trimester and threatened preterm labor.  Hx of LTCS at 31 wks for abruption and breech with G2  Plan:  Vaginal bleeding     -no signs of previa or abruption on US  from limited imaging on 12/25/23    -normal coags    -negative GC/CT    -GBS negative    -Maternal ABO O neg, Rhogam received [facility] at [facility], fetus confirmed RH positive, per Dr. Margaretta note, no Rhogam  should be given for 12 weeks post Rhogam on 12/04/23 Fetal status    -U/S 12/26/23 transverse w/ head to maternal right/ anterior placenta/ EFW 1# 12oz (798 grams, 86%)     -BMZ complete 12/26/23    -NICU consult complete      -NST BID, reassuring at 0200 on 6/29 Psych/Social hx    -Suicide attempt via overdose in 2021 (Tylenol  and NyQuil per ED note) and 2024 (Bupropion )     -No current meds    - Plan close postpartum F/U IDA    -S/P IV Venofer  12/25/23    -Hgb this AM 7.2, consult with Dr. Rosalva, will transfuse 1 Unit of PRBCs today    -R/B/A of blood transfusion with acute blood loss and hypovolemia reviewed with family.    Declining transfusion can lead to prolonged recovery, interference with milk production,  fatigue, shortness of breath, and risk for syncope or injury. Transfusion of blood products will replace blood cells and volume but will increase the risk for exposure to infectious blood-borne disease even with screening, allergic reaction, and fever. Patient consents to transfusion.    MFM recommendations -Continue inpatient management for 7  to 10 days. Discharge should not be considered unless the patient stops bleeding for more than 72 to 96 hours. -Blood should be available at short notice. - Consent for emergency cesarean delivery and bilateral tubal ligation (declines sterilization for poor fetal prognosis or IUFD) - Complete antenatal corticosteroid course (completed) - Defer GDM screening for at least 7 to 10 days. - NICU consultation (completed) - Weekly ultrasounds - Vaginal delivery can be safely attempted if patient goes into active labor by that cephalic presentation persists (transverse on 11/2723)  Dr. Rosalva updated with patient status / plan of care  Kristen Padilla 12/28/2023, 9:26 AM   Patient was seen and examined this morning. She denies any current pain or active bleeding. She does endorse episodes of spotting over night.  Her hgb this am is 7.2 recommend blood  transfusion. I reviewed with her R/B/A of transfusion including but not limited to transfusion reaction / r/o HIV hepatitis B and C . She voiced understanding and is agreeable to Transfusion. Blood bank was called they have 1 Unit prepared for her . We will transfuse that unit today and plan cbc in AM . If it has not increased appropriately and still minimal spotting consider additional unit of blood.

## 2023-12-29 LAB — TYPE AND SCREEN: ABO/RH(D): O NEG

## 2023-12-29 LAB — CBC
HCT: 27.9 % — ABNORMAL LOW (ref 36.0–46.0)
Hemoglobin: 8.8 g/dL — ABNORMAL LOW (ref 12.0–15.0)
MCH: 24.4 pg — ABNORMAL LOW (ref 26.0–34.0)
MCHC: 31.5 g/dL (ref 30.0–36.0)
MCV: 77.5 fL — ABNORMAL LOW (ref 80.0–100.0)
Platelets: 327 10*3/uL (ref 150–400)
RBC: 3.6 MIL/uL — ABNORMAL LOW (ref 3.87–5.11)
RDW: 16.1 % — ABNORMAL HIGH (ref 11.5–15.5)
WBC: 13.3 10*3/uL — ABNORMAL HIGH (ref 4.0–10.5)
nRBC: 1.5 % — ABNORMAL HIGH (ref 0.0–0.2)

## 2023-12-29 MED ORDER — MAGNESIUM SULFATE 40 GM/1000ML IV SOLN
2.0000 g/h | Freq: Once | INTRAVENOUS | Status: AC
  Administered 2023-12-29: 2 g/h via INTRAVENOUS
  Filled 2023-12-29: qty 1000

## 2023-12-29 MED ORDER — FENTANYL CITRATE (PF) 100 MCG/2ML IJ SOLN
100.0000 ug | Freq: Once | INTRAMUSCULAR | Status: AC
  Administered 2023-12-29: 100 ug via INTRAVENOUS
  Filled 2023-12-29: qty 2

## 2023-12-29 MED ORDER — FENTANYL CITRATE (PF) 100 MCG/2ML IJ SOLN: 100.0000 ug | INTRAMUSCULAR | Status: DC | PRN

## 2023-12-29 MED ORDER — LACTATED RINGERS IV BOLUS
500.0000 mL | Freq: Once | INTRAVENOUS | Status: AC
  Administered 2023-12-29: 500 mL via INTRAVENOUS

## 2023-12-29 MED ORDER — NIFEDIPINE 10 MG PO CAPS
10.0000 mg | ORAL_CAPSULE | Freq: Once | ORAL | Status: AC
  Administered 2023-12-29: 10 mg via ORAL
  Filled 2023-12-29: qty 1

## 2023-12-29 MED ORDER — SODIUM CHLORIDE 0.9 % IV SOLN
12.5000 mg | Freq: Once | INTRAVENOUS | Status: AC
  Administered 2023-12-29: 12.5 mg via INTRAVENOUS
  Filled 2023-12-29: qty 0.5

## 2023-12-29 NOTE — Progress Notes (Signed)
 Pt with  complaintsof  VB and pressure.   No LOF  Good FM  BP 110/60   Pulse (!) 101   Temp 98.1 F (36.7 C) (Oral)   Resp 17   Ht 5' 2 (1.575 m)   Wt 82.7 kg   LMP 07/03/2023 (Approximate)   SpO2 98%   BMI 33.34 kg/m   FHTS 150  Toco unable to see but the nurse feels them q 5 min  Pt in NAD CV RRR Lungs CTAB abd  Gravid soft and NT GU no vb EXt no calf tenderness Results for orders placed or performed during the hospital encounter of 12/25/23 (from the past 72 hours)  CBC     Status: Abnormal   Collection Time: 12/28/23  7:33 AM  Result Value Ref Range   WBC 13.8 (H) 4.0 - 10.5 K/uL   RBC 3.05 (L) 3.87 - 5.11 MIL/uL   Hemoglobin 7.2 (L) 12.0 - 15.0 g/dL    Comment: Reticulocyte Hemoglobin testing may be clinically indicated, consider ordering this additional test [id number]    HCT 23.0 (L) 36.0 - 46.0 %   MCV 75.4 (L) 80.0 - 100.0 fL   MCH 23.6 (L) 26.0 - 34.0 pg   MCHC 31.3 30.0 - 36.0 g/dL   RDW 84.5 88.4 - 84.4 %   Platelets 317 150 - 400 K/uL   nRBC 0.8 (H) 0.0 - 0.2 %    Comment: Performed at [facility], [address] [place], [place] [address]  Prepare RBC (crossmatch)     Status: None   Collection Time: 12/28/23 10:02 AM  Result Value Ref Range   Order Confirmation      ORDER PROCESSED BY BLOOD BANK Performed at [facility], [address] [place], [place] [address]   Type and screen [facility]     Status: None   Collection Time: 12/28/23 11:47 PM  Result Value Ref Range   ABO/RH(D) O NEG    Antibody Screen NEG    Sample Expiration      12/31/2023,2359 Performed at [facility], [address], [place], [place] [address]   CBC     Status: Abnormal   Collection Time: 12/28/23 11:48 PM  Result Value Ref Range   WBC 13.3 (H) 4.0 - 10.5 K/uL   RBC 3.60 (L) 3.87 - 5.11 MIL/uL   Hemoglobin 8.8 (L) 12.0 - 15.0 g/dL    Comment: Reticulocyte Hemoglobin testing may be clinically indicated, consider ordering  this additional test [id number]    HCT 27.9 (L) 36.0 - 46.0 %   MCV 77.5 (L) 80.0 - 100.0 fL   MCH 24.4 (L) 26.0 - 34.0 pg   MCHC 31.5 30.0 - 36.0 g/dL   RDW 83.8 (H) 88.4 - 84.4 %   Platelets 327 150 - 400 K/uL   nRBC 1.5 (H) 0.0 - 0.2 %    Comment: Performed at [facility], [address], [place], [place] [address]    Assessment and Plan [redacted]w[redacted]d Aburption now with mild bleeding She feels better after fentanyl  Wlll start magnesium  and use fentanyl  prn Pt had received procardia and tylenol  but no relief.  Earlier this morning she felt fine.   Patient ID: Kristen Padilla, female   DOB: 03-Dec-1990, 33 y.o.   MRN: [id number]

## 2023-12-30 LAB — CBC
HCT: 25.7 % — ABNORMAL LOW (ref 36.0–46.0)
HCT: 25.9 % — ABNORMAL LOW (ref 36.0–46.0)
Hemoglobin: 8 g/dL — ABNORMAL LOW (ref 12.0–15.0)
Hemoglobin: 8 g/dL — ABNORMAL LOW (ref 12.0–15.0)
MCH: 24.3 pg — ABNORMAL LOW (ref 26.0–34.0)
MCH: 24.2 pg — ABNORMAL LOW (ref 26.0–34.0)
MCHC: 31.1 g/dL (ref 30.0–36.0)
MCHC: 30.9 g/dL (ref 30.0–36.0)
MCV: 78.1 fL — ABNORMAL LOW (ref 80.0–100.0)
MCV: 78.5 fL — ABNORMAL LOW (ref 80.0–100.0)
Platelets: 298 10*3/uL (ref 150–400)
Platelets: 294 10*3/uL (ref 150–400)
RBC: 3.29 MIL/uL — ABNORMAL LOW (ref 3.87–5.11)
RBC: 3.3 MIL/uL — ABNORMAL LOW (ref 3.87–5.11)
RDW: 16.5 % — ABNORMAL HIGH (ref 11.5–15.5)
RDW: 17 % — ABNORMAL HIGH (ref 11.5–15.5)
WBC: 12.8 10*3/uL — ABNORMAL HIGH (ref 4.0–10.5)
WBC: 12.6 10*3/uL — ABNORMAL HIGH (ref 4.0–10.5)
nRBC: 0.2 % (ref 0.0–0.2)
nRBC: 0.2 % (ref 0.0–0.2)

## 2023-12-30 LAB — MAGNESIUM
Magnesium: 4.7 mg/dL — ABNORMAL HIGH (ref 1.7–2.4)
Magnesium: 4.5 mg/dL — ABNORMAL HIGH (ref 1.7–2.4)

## 2023-12-30 MED ORDER — LACTATED RINGERS IV SOLN: INTRAVENOUS | Status: AC

## 2023-12-30 NOTE — Plan of Care (Signed)
  Problem: Education: Goal: Knowledge of disease or condition will improve Outcome: Progressing Goal: Knowledge of the prescribed therapeutic regimen will improve Outcome: Progressing   Problem: Clinical Measurements: Goal: Complications related to the disease process, condition or treatment will be avoided or minimized Outcome: Progressing   Problem: Health Behavior/Discharge Planning: Goal: Ability to manage health-related needs will improve Outcome: Progressing   Problem: Clinical Measurements: Goal: Ability to maintain clinical measurements within normal limits will improve Outcome: Progressing Goal: Will remain free from infection Outcome: Progressing Goal: Diagnostic test results will improve Outcome: Progressing   Problem: Activity: Goal: Risk for activity intolerance will decrease Outcome: Progressing   Problem: Coping: Goal: Level of anxiety will decrease Outcome: Progressing   Problem: Elimination: Goal: Will not experience complications related to bowel motility Outcome: Progressing   Problem: Pain Managment: Goal: General experience of comfort will improve and/or be controlled Outcome: Progressing   Problem: Safety: Goal: Ability to remain free from injury will improve Outcome: Progressing   Problem: Skin Integrity: Goal: Risk for impaired skin integrity will decrease Outcome: Progressing

## 2023-12-30 NOTE — Progress Notes (Signed)
 Patient ID: Kristen Padilla, female   DOB: [birth year], 33 y.o.   MRN: [id number] Kristen Padilla is a 33 y.o. H1E8847 at [redacted]w[redacted]d admitted for third trimester bleeding.  Hospital Day No: 6  Subjective: Pt denies any bleeding now.  She just noticed some brown discharge when she went to the bathroom earlier.  Rn had notified me that she was c/o blurry vision so magnesium  was stopped and pt now says her vision is much better now and she can see her phone better.  She denies any contraction or feeling pressure.  Objective: BP (!) 93/54 (BP Location: Right Arm)   Pulse 88   Temp 97.8 F (36.6 C) (Oral)   Resp 18   Ht 5' 2 (1.575 m)   Wt 82.7 kg   LMP 07/03/2023 (Approximate)   SpO2 95%   BMI 33.34 kg/m  I/O last 3 completed shifts: In: 1106.8 [P.O.:180; I.V.:423.9; IV Piggyback:502.9] Out: 1394 [Urine:1300; Blood:94] Total I/O In: 900 [P.O.:900] Out: 1750 [Urine:1750]  Physical Exam:  Gen: alert, cooperative and no distress Chest/Lungs: cta bilaterally  Heart/Pulse: RRR  Abdomen: soft, gravid, nontender Uterine fundus: soft, nontender Skin & Color: warm and dry  Neurological: AOx3, DTRs 2+ EXT: negative Homan's b/l, edema neg  FHT:  FHR: 135 bpm, variability: min-mod,  accelerations:   none,  decelerations:  late appearing decel x 1 at 12:48p UC:   rare ctx SVE:   Dilation: 1 Exam by:: Dr. Ilean  Labs: Lab Results  Component Value Date   WBC 12.6 (H) 12/30/2023   HGB 8.0 (L) 12/30/2023   HCT 25.9 (L) 12/30/2023   MCV 78.5 (L) 12/30/2023   PLT 294 12/30/2023    Assessment and Plan: has Depression; GAD (generalized anxiety disorder); PTSD (post-traumatic stress disorder); Rh negative, antepartum; Vaginal bleeding in pregnancy, second trimester; Overdose, intentional self-harm, initial encounter [facility]); MDD (major depressive disorder); Adjustment disorder with depressed mood; Obesity affecting pregnancy, antepartum; History of prior pregnancy  with short cervix, currently pregnant; History of sleeve gastrectomy; History of multiple spontaneous abortions; History of shoulder dystocia in prior pregnancy, currently pregnant; History of preterm delivery, currently pregnant; History of placenta abruption; [redacted] weeks gestation of pregnancy; and Previous cesarean delivery, antepartum condition or complication on their problem list. 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  Pt would like to take monitor off.  At that time will resume NST q shift and continue continuous toco.  Kristen Padilla 12/30/2023, 12:34 PM

## 2023-12-31 ENCOUNTER — Inpatient Hospital Stay (HOSPITAL_COMMUNITY)

## 2023-12-31 DIAGNOSIS — Z3A25 25 weeks gestation of pregnancy: Secondary | ICD-10-CM

## 2023-12-31 DIAGNOSIS — O4692 Antepartum hemorrhage, unspecified, second trimester: Secondary | ICD-10-CM | POA: Diagnosis not present

## 2023-12-31 DIAGNOSIS — O09212 Supervision of pregnancy with history of pre-term labor, second trimester: Secondary | ICD-10-CM | POA: Diagnosis not present

## 2023-12-31 DIAGNOSIS — O328XX0 Maternal care for other malpresentation of fetus, not applicable or unspecified: Secondary | ICD-10-CM | POA: Diagnosis not present

## 2023-12-31 DIAGNOSIS — O34219 Maternal care for unspecified type scar from previous cesarean delivery: Secondary | ICD-10-CM

## 2023-12-31 LAB — BPAM RBC
Blood Product Expiration Date: 202507072359
Blood Product Expiration Date: 202507012359
Blood Product Expiration Date: 202507042359
Blood Product Unit Number: 202507072359
ISSUE DATE / TIME: 202506290251
ISSUE DATE / TIME: 202506291206
ISSUE DATE / TIME: 202507010634
PRODUCT CODE: 202507010603
PRODUCT CODE: 202507072359
Unit Type and Rh: 9500
Unit Type and Rh: 202507072359
Unit Type and Rh: 9500
Unit Type and Rh: 9500
Unit Type and Rh: 9500
Unit Type and Rh: 9500

## 2023-12-31 LAB — TYPE AND SCREEN
ABO/RH(D): O NEG
Antibody Screen: NEGATIVE
Unit division: 0
Unit division: 0
Unit division: 0

## 2023-12-31 MED ORDER — SODIUM CHLORIDE 0.9 % IV SOLN
12.5000 mg | Freq: Once | INTRAVENOUS | Status: AC
  Administered 2023-12-31: 12.5 mg via INTRAVENOUS
  Filled 2023-12-31: qty 0.5

## 2023-12-31 NOTE — Progress Notes (Signed)
 Pt without complaints.  No leakage of fluid.  Good FM.  She just sees dried  blood with wiping.   BP (!) 95/55 (BP Location: Right Arm)   Pulse 77   Temp 98.1 F (36.7 C) (Oral)   Resp 17   Ht 5' 2 (1.575 m)   Wt 82.7 kg   LMP 07/03/2023 (Approximate)   SpO2 99%   BMI 33.34 kg/m   FHTS 130s with accels.   Toco none  Pt in NAD CV RRR Lungs CTAB abd  Gravid soft and NT GU no vb EXt no calf tenderness Results for orders placed or performed during the hospital encounter of 12/25/23 (from the past 72 hours)  Prepare RBC (crossmatch)     Status: None   Collection Time: 12/28/23 10:02 AM  Result Value Ref Range   Order Confirmation      ORDER PROCESSED BY BLOOD BANK Performed at [facility], [address], [place], [place] [address]   Type and screen [facility]     Status: None   Collection Time: 12/28/23 11:47 PM  Result Value Ref Range   ABO/RH(D) O NEG    Antibody Screen NEG    Sample Expiration      12/31/2023,2359 Performed at [facility], [address], [place], [place] [address]   CBC     Status: Abnormal   Collection Time: 12/28/23 11:48 PM  Result Value Ref Range   WBC 13.3 (H) 4.0 - 10.5 K/uL   RBC 3.60 (L) 3.87 - 5.11 MIL/uL   Hemoglobin 8.8 (L) 12.0 - 15.0 g/dL    Comment: Reticulocyte Hemoglobin testing may be clinically indicated, consider ordering this additional test [id number]    HCT 27.9 (L) 36.0 - 46.0 %   MCV 77.5 (L) 80.0 - 100.0 fL   MCH 24.4 (L) 26.0 - 34.0 pg   MCHC 31.5 30.0 - 36.0 g/dL   RDW 83.8 (H) 88.4 - 84.4 %   Platelets 327 150 - 400 K/uL   nRBC 1.5 (H) 0.0 - 0.2 %    Comment: Performed at [facility], [address] [place], [place] [address]  Magnesium      Status: Abnormal   Collection Time: 12/30/23  6:13 AM  Result Value Ref Range   Magnesium  4.7 (H) 1.7 - 2.4 mg/dL    Comment: Performed at [facility], [address] [place], [place] [address]  CBC     Status: Abnormal    Collection Time: 12/30/23  6:13 AM  Result Value Ref Range   WBC 12.8 (H) 4.0 - 10.5 K/uL   RBC 3.29 (L) 3.87 - 5.11 MIL/uL   Hemoglobin 8.0 (L) 12.0 - 15.0 g/dL   HCT 74.2 (L) 63.9 - 53.9 %   MCV 78.1 (L) 80.0 - 100.0 fL   MCH 24.3 (L) 26.0 - 34.0 pg   MCHC 31.1 30.0 - 36.0 g/dL   RDW 83.4 (H) 88.4 - 84.4 %   Platelets 298 150 - 400 K/uL   nRBC 0.2 0.0 - 0.2 %    Comment: Performed at [facility], [address] [place], [place] [address]  CBC     Status: Abnormal   Collection Time: 12/30/23 11:51 AM  Result Value Ref Range   WBC 12.6 (H) 4.0 - 10.5 K/uL   RBC 3.30 (L) 3.87 - 5.11 MIL/uL   Hemoglobin 8.0 (L) 12.0 - 15.0 g/dL   HCT 74.0 (L) 63.9 -  46.0 %   MCV 78.5 (L) 80.0 - 100.0 fL   MCH 24.2 (L) 26.0 - 34.0 pg   MCHC 30.9 30.0 - 36.0 g/dL   RDW 82.9 (H) 88.4 - 84.4 %   Platelets 294 150 - 400 K/uL   nRBC 0.2 0.0 - 0.2 %    Comment: Performed at [facility], [address], [place], [place] [address]  Magnesium      Status: Abnormal   Collection Time: 12/30/23 11:51 AM  Result Value Ref Range   Magnesium  4.5 (H) 1.7 - 2.4 mg/dL    Comment: Performed at [facility], [address], [place], [place] [address]    Assessment and Plan [redacted]w[redacted]d  Bleeding with preterm contractions Will consult MFM about tocolysis.  Pt is s/p BMZ She feels the baby is now vtx will do US  Cap IVF and just run at night

## 2023-12-31 NOTE — Plan of Care (Signed)
  Problem: Education: Goal: Knowledge of disease or condition will improve Outcome: Progressing Goal: Knowledge of the prescribed therapeutic regimen will improve Outcome: Progressing   Problem: Clinical Measurements: Goal: Complications related to the disease process, condition or treatment will be avoided or minimized Outcome: Progressing   Problem: Health Behavior/Discharge Planning: Goal: Ability to manage health-related needs will improve Outcome: Progressing   Problem: Clinical Measurements: Goal: Ability to maintain clinical measurements within normal limits will improve Outcome: Progressing Goal: Will remain free from infection Outcome: Progressing Goal: Diagnostic test results will improve Outcome: Progressing   Problem: Activity: Goal: Risk for activity intolerance will decrease Outcome: Progressing   Problem: Coping: Goal: Level of anxiety will decrease Outcome: Progressing   Problem: Elimination: Goal: Will not experience complications related to bowel motility Outcome: Progressing   Problem: Pain Managment: Goal: General experience of comfort will improve and/or be controlled Outcome: Progressing   Problem: Safety: Goal: Ability to remain free from injury will improve Outcome: Progressing   Problem: Skin Integrity: Goal: Risk for impaired skin integrity will decrease Outcome: Progressing

## 2024-01-01 ENCOUNTER — Encounter (HOSPITAL_COMMUNITY): Payer: Self-pay | Admitting: Obstetrics and Gynecology

## 2024-01-01 LAB — TYPE AND SCREEN
ABO/RH(D): O NEG
Antibody Screen: NEGATIVE

## 2024-01-01 NOTE — Progress Notes (Signed)
 [facility] Day #7  Kristen Padilla is a 33 y.o. H1E8847 at [redacted]w[redacted]d by admitted for vaginal bleeding in 2nd trimester and threatened preterm labor.   Subjective:  Feeling better this morning. Denies any bright red bleeding since Monday evening. Would like a plan for going home. Denies cramping, states she think the cramping over the last two days is GI related from her gastric sleeve. Agrees to stop IV fluid today and see how she feels.    Tolerating po intake / no nausea / no vomiting  Bowel habits WNL / Voiding QS  Objective: Vital signs: VS: Blood pressure (!) 105/58, pulse 76, temperature 97.9 F (36.6 C), temperature source Oral, resp. rate 17, height 5' 2 (1.575 m), weight 82.7 kg, last menstrual period 07/03/2023, SpO2 98%, not currently breastfeeding.  Labs: Results for orders placed or performed during the Padilla encounter of 12/25/23 (from the past 24 hours)  Type and screen Kristen Padilla     Status: None   Collection Time: 01/01/24  6:04 AM  Result Value Ref Range   ABO/RH(D) O NEG    Antibody Screen NEG    Sample Expiration      01/04/2024,2359 Performed at [facility], [address], [place], [place] [address]    Physical exam:       General appearance/behavior: AAO x 4       Abdomen: non-tender       Extremities: no edema          FHR: NST this AM reassuring, FHT WNL for gestational age, no decels TOCO: quiet  Assessment: Padilla day #7 Kristen Padilla is a 33 y.o. H1E8847 at [redacted]w[redacted]d by admitted for vaginal bleeding in 2nd trimester and threatened preterm labor.  Hx of LTCS at 31 wks for abruption and breech with G2  Plan:  Vaginal bleeding     -no signs of previa or abruption on US  from 12/31/2023    -normal coags    -negative GC/CT    -GBS negative    -Maternal ABO O neg, Rhogam received 12/04/23 at [facility], fetus confirmed RH positive, per Dr. Margaretta note, no Rhogam should be given for 12 weeks  post Rhogam on 12/04/23 Preterm labor    -Started magnesium  sulfate on 6/29, discontinued on 6/30    - Denies contractions or cramping today    - Stop IV fluids today Fetal status    -U/S 01/01/2024, BREECH, normal AFI, MVP 4.7cm    -BMZ complete 12/26/23    -NICU consult complete      -NST BID Psych/Social hx    -Suicide attempt via overdose in 2021 (Tylenol  and NyQuil per ED note) and 2024 (Bupropion )     -No current meds    - Plan close postpartum F/U IDA    -S/P IV Venofer  12/25/23. S/P one unit of PRBCs on 6/29    - Last Hgb 8.0 on 7/1, stable  MFM recommendations on 12/31/2023 - Continue inpatient care. Tocolysis can be used cautiously  as long as her blood pressure remains normal. Currently her  blood pressure is on the lower end of normal and tocolysis  should likely be avoided. Magnesium  sulfate is reserved for if  delivery is thought to be imminent for fetal neuroprotection.  She is also s/p betamethasone  and tocolysis should not be  given (typically) after the steroid window since they are  ineffective and have potential side effects, especially in the  setting of an abruption.  Dr. Armond updated with patient status/plan of care.  Alan MARLA Molt 01/01/2024, 9:55 AM

## 2024-01-01 NOTE — Plan of Care (Signed)
  Problem: Education: Goal: Knowledge of disease or condition will improve Outcome: Progressing Goal: Knowledge of the prescribed therapeutic regimen will improve Outcome: Progressing   Problem: Clinical Measurements: Goal: Complications related to the disease process, condition or treatment will be avoided or minimized Outcome: Progressing   Problem: Health Behavior/Discharge Planning: Goal: Ability to manage health-related needs will improve Outcome: Progressing   Problem: Clinical Measurements: Goal: Ability to maintain clinical measurements within normal limits will improve Outcome: Progressing Goal: Will remain free from infection Outcome: Progressing Goal: Diagnostic test results will improve Outcome: Progressing   Problem: Activity: Goal: Risk for activity intolerance will decrease Outcome: Progressing   Problem: Coping: Goal: Level of anxiety will decrease Outcome: Progressing   Problem: Elimination: Goal: Will not experience complications related to bowel motility Outcome: Progressing   Problem: Pain Managment: Goal: General experience of comfort will improve and/or be controlled Outcome: Progressing   Problem: Safety: Goal: Ability to remain free from injury will improve Outcome: Progressing   Problem: Skin Integrity: Goal: Risk for impaired skin integrity will decrease Outcome: Progressing

## 2024-01-02 NOTE — Progress Notes (Signed)
 Called to the room and patient in tears stating that she needed to be discharged now due to childcare issues. Patient stated that she would have to leave AMA if MD not willing to discharge her. Mercer Peal, CNM notified and is not willing to discharge patient based on MFM recommendations. CNM asked RN to ask the patient to stay a little while and try to work something out. RN discussed with patient the risks of leaving early AMA, asked patient if she could try to make other childcare arrangements. Patient agrees to make some calls and then receives a call and becomes upset and asking where her child is. Patient seems to want privacy for conversation, RN exits and tells her to call RN.
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
## 2024-01-02 NOTE — Progress Notes (Addendum)
 Subjective:  Doing well, reports last bleed as 12/29/23. Denies abd pain, cramping, and perceives active FM.   Objective: Vitals:   01/01/24 1925 01/01/24 2332 01/02/24 0738 01/02/24 1145  BP: 102/64 (!) 106/59 (!) 95/59 (!) 101/57  Pulse: 85 93 83 97  Resp: 18 20 16 16   Temp: 98.2 F (36.8 C) 97.7 F (36.5 C) 97.8 F (36.6 C) 97.7 F (36.5 C)  TempSrc: Oral Oral Oral Oral  SpO2: 100% 98% 97% 98%  Weight:      Height:        No intake/output data recorded.   Physical Exam: VS: Blood pressure (!) 101/57, pulse 97, temperature 97.7 F (36.5 C), temperature source Oral, resp. rate 16, height 5' 2 (1.575 m), weight 82.7 kg, last menstrual period 07/03/2023, SpO2 98%, not currently breastfeeding. AAO x3, no signs of distress Cardiovascular: RRR Respiratory: Unlabored GU/GI: Abdomen gravid, non-tender, non-distended, active FM  Extremities: no edema, negative for pain, tenderness, and cords  Cervical exam:Dilation: 1.5 Effacement (%): Thick Exam by:: Abby Polos, RN NST: baseline rate 145 / variability moderate / accelerations 10x10 / variable decelerations TOCO: none   Labs:  No results for input(s): WBC, HGB, HCT, PLT in the last 72 hours.  Assessment / Plan: 33 y.o. H1E8847 [redacted]w[redacted]d  Hospital day #8 Kristen Padilla is a 33 y.o. H1E8847 at [redacted]w[redacted]d by admitted for vaginal bleeding in 2nd trimester and threatened preterm labor.  Hx of LTCS at 31 wks for abruption and breech with G2   Plan:  Vaginal bleeding     -no signs of previa or abruption on US  from 12/31/2023    -normal coags    -negative GC/CT    -GBS negative    -Maternal ABO O neg, Rhogam received 12/04/23 at [facility], fetus confirmed RH positive, per Dr. Margaretta note, no Rhogam should be given for 12 weeks post Rhogam on 12/04/23 Preterm labor    -Started magnesium  sulfate on 6/29, discontinued on 6/30    - Stop IV fluids today Fetal status    -U/S 01/01/2024, BREECH, normal AFI, MVP 4.7 cm, next  sono scheduled for 01/07/24    -BMZ complete 12/26/23    -NICU consult complete      -NST BID Psych/Social hx    -Suicide attempt via overdose in 2021 (Tylenol  and NyQuil per ED note) and 2024 (Bupropion )     -No current meds    - Plan close postpartum F/U IDA    -S/P IV Venofer  12/25/23. S/P one unit of PRBCs on 6/29    - Last Hgb 8.0 on 7/1, stable   MFM recommendations on 12/31/2023 - Continue inpatient care. Tocolysis can be used cautiously  as long as her blood pressure remains normal. Currently her  blood pressure is on the lower end of normal and tocolysis  should likely be avoided. Magnesium  sulfate is reserved for if  delivery is thought to be imminent for fetal neuroprotection.  She is also s/p betamethasone  and tocolysis should not be  given (typically) after the steroid window since they are  ineffective and have potential side effects, especially in the  setting of an abruption.  Plan reviewed w/ Dr. Armond Mercer Peal DNP, CNM 01/02/2024, 12:24 PM Agree with plan of care.  I discussed with the pt the need to stay in the hospital.  She voiced understanding.

## 2024-01-02 NOTE — Progress Notes (Signed)
 Nutrition Assessment: Antenatal LOS > 7 days  Nutrition Recommendations: Continue Regular Diet Patient may order snacks TID from the menu and double protein portions with meals Current diet prescription will provide for increased needs of pregnancy  Patient is s/p sleeve gastrectomy in 2022 and has not been able to have yearly follow-up with surgeon as the surgery was completed in [place]. She has not previously taken bariatric vitamins. Per the American Society for Metabolic and Bariatric Surgery Guidelines 2016, patient should have routing screening for micronutrient deficiencies. She is at highest risk for deficiencies in thiamine, vitamin B12, folate, iron , calcium , vitamin D, vitamin A, vitamin E, vitamin C, zinc, and copper. Recommend pt take a prenatal vitamin. She reports she cannot have the prenatal vitamins on hospital formulary as they cause a rash. After discussing with pharmacist, if pt were to purchase another brand of prenatal vitamin, this could be dispensed as a home medication while pt is admitted. There are also brands that make bariatric prenatal multivitamins (Celebrate) that may be more appropriate for pt in setting of hx of sleeve gastrectomy and high risk for deficiencies.   33 year old patient, at [redacted]w[redacted]d gestation. Admitted for vaginal bleeding in 2nd trimester and threatened preterm labor. Hx of sleeve gastrectomy 03/2021 in [place].  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   History of multiple spontaneous abortions 10/22/2023   History of shoulder dystocia in prior pregnancy, currently pregnant 10/22/2023   History of  preterm delivery, currently pregnant 10/22/2023   Adjustment disorder with depressed mood 01/15/2023   MDD (major depressive disorder) 01/14/2023   Overdose, intentional self-harm, initial encounter (HCC) 01/12/2023   Vaginal bleeding in pregnancy, second trimester 06/17/2022   Rh negative, antepartum 12/20/2021   PTSD (post-traumatic stress disorder) 06/12/2020   GAD (generalized anxiety disorder) 06/05/2020   Depression     Patient reports diet tolerance as good. Pt reports good appetite and intake. Reports typically eating a small meal every few hours at baseline. Denies food allergies/intolerances, nausea, emesis, abdominal pain. Reports having regular bowel movements.  Of note, pt reports she had sleeve gastrectomy in 2022 in [place]. She does not have follow-up with surgeon since surgery was completed in [place]. She reports she has never taken bariatric multivitamins. Not currently taking prenatal multivitamins. She reports the prenatal multivitamin carried on hospital formulary caused a rash previously.  Weight History: Reports pre-pregnancy wt 180 lbs. Noted wt 183 lbs on 07/25/23 Pre-pregnancy BMI 32.9 kg/m2 Weight gain to date 0 lbs  Estimated needs: 2200 - 2400 Kcal/day 95 - 105 g protein/day 2.5-3 L fluid requirements   Labs and medications reviewed   Nutrition Dx: Increased nutrient needs related to pregnancy and fetal growth requirements as evidenced by [redacted] weeks gestation.  Patient is assessed at low nutritional risk. Consult Registered Dietitian if concerns arise.

## 2024-01-04 ENCOUNTER — Encounter (HOSPITAL_COMMUNITY): Payer: Self-pay | Admitting: Obstetrics and Gynecology

## 2024-01-04 ENCOUNTER — Other Ambulatory Visit: Payer: Self-pay

## 2024-01-04 ENCOUNTER — Inpatient Hospital Stay (HOSPITAL_COMMUNITY)
Admission: AD | Admit: 2024-01-04 | Discharge: 2024-01-12 | DRG: 786 | Disposition: A | Discharge status: Home / Self Care | Payer: Self-pay | Attending: Obstetrics and Gynecology | Admitting: Obstetrics and Gynecology

## 2024-01-04 DIAGNOSIS — O99844 Bariatric surgery status complicating childbirth: Secondary | ICD-10-CM | POA: Diagnosis present

## 2024-01-04 DIAGNOSIS — O4592 Premature separation of placenta, unspecified, second trimester: Principal | ICD-10-CM | POA: Diagnosis present

## 2024-01-04 DIAGNOSIS — F411 Generalized anxiety disorder: Secondary | ICD-10-CM | POA: Diagnosis present

## 2024-01-04 DIAGNOSIS — O4690 Antepartum hemorrhage, unspecified, unspecified trimester: Secondary | ICD-10-CM

## 2024-01-04 DIAGNOSIS — Z3A26 26 weeks gestation of pregnancy: Secondary | ICD-10-CM | POA: Diagnosis not present

## 2024-01-04 DIAGNOSIS — Z833 Family history of diabetes mellitus: Secondary | ICD-10-CM

## 2024-01-04 DIAGNOSIS — O9902 Anemia complicating childbirth: Secondary | ICD-10-CM | POA: Diagnosis present

## 2024-01-04 DIAGNOSIS — Z8249 Family history of ischemic heart disease and other diseases of the circulatory system: Secondary | ICD-10-CM | POA: Diagnosis not present

## 2024-01-04 DIAGNOSIS — Z3A25 25 weeks gestation of pregnancy: Secondary | ICD-10-CM | POA: Diagnosis not present

## 2024-01-04 DIAGNOSIS — O34212 Maternal care for vertical scar from previous cesarean delivery: Secondary | ICD-10-CM | POA: Diagnosis not present

## 2024-01-04 DIAGNOSIS — O26892 Other specified pregnancy related conditions, second trimester: Secondary | ICD-10-CM | POA: Diagnosis present

## 2024-01-04 DIAGNOSIS — F4321 Adjustment disorder with depressed mood: Secondary | ICD-10-CM | POA: Diagnosis present

## 2024-01-04 DIAGNOSIS — Z882 Allergy status to sulfonamides status: Secondary | ICD-10-CM

## 2024-01-04 DIAGNOSIS — O99214 Obesity complicating childbirth: Secondary | ICD-10-CM | POA: Diagnosis present

## 2024-01-04 DIAGNOSIS — O09212 Supervision of pregnancy with history of pre-term labor, second trimester: Secondary | ICD-10-CM | POA: Diagnosis not present

## 2024-01-04 DIAGNOSIS — O4692 Antepartum hemorrhage, unspecified, second trimester: Secondary | ICD-10-CM | POA: Diagnosis present

## 2024-01-04 DIAGNOSIS — Z6791 Unspecified blood type, Rh negative: Secondary | ICD-10-CM

## 2024-01-04 DIAGNOSIS — D509 Iron deficiency anemia, unspecified: Secondary | ICD-10-CM | POA: Diagnosis present

## 2024-01-04 DIAGNOSIS — O34211 Maternal care for low transverse scar from previous cesarean delivery: Secondary | ICD-10-CM | POA: Diagnosis present

## 2024-01-04 DIAGNOSIS — O328XX0 Maternal care for other malpresentation of fetus, not applicable or unspecified: Secondary | ICD-10-CM | POA: Diagnosis not present

## 2024-01-04 LAB — TYPE AND SCREEN
ABO/RH(D): O NEG
Antibody Screen: NEGATIVE

## 2024-01-04 MED ORDER — ACETAMINOPHEN 325 MG PO TABS
650.0000 mg | ORAL_TABLET | ORAL | Status: DC | PRN
Start: 2024-01-04 — End: 2024-01-09
  Filled 2024-01-04: qty 2

## 2024-01-04 MED ORDER — PRENATAL MULTIVITAMIN CH: 1.0000 | ORAL_TABLET | Freq: Every day | ORAL | Status: DC

## 2024-01-04 MED ORDER — DOCUSATE SODIUM 100 MG PO CAPS
100.0000 mg | ORAL_CAPSULE | Freq: Every day | ORAL | Status: DC
Start: 2024-01-05 — End: 2024-01-09
  Filled 2024-01-04: qty 1

## 2024-01-04 MED ORDER — COMPLETENATE 29-1 MG PO CHEW
1.0000 | CHEWABLE_TABLET | Freq: Every day | ORAL | Status: DC
  Filled 2024-01-04: qty 1

## 2024-01-04 MED ORDER — MAGNESIUM SULFATE 40 GM/1000ML IV SOLN
2.0000 g/h | INTRAVENOUS | Status: AC
  Administered 2024-01-04 – 2024-01-05 (×2): 2 g/h via INTRAVENOUS
  Filled 2024-01-04 (×2): qty 1000

## 2024-01-04 MED ORDER — MAGNESIUM SULFATE BOLUS VIA INFUSION
4.0000 g | Freq: Once | INTRAVENOUS | Status: AC
  Administered 2024-01-04: 4 g via INTRAVENOUS
  Filled 2024-01-04: qty 1000

## 2024-01-04 MED ORDER — LACTATED RINGERS IV SOLN: 125.0000 mL/h | INTRAVENOUS | Status: AC

## 2024-01-04 MED ORDER — LACTATED RINGERS IV SOLN: INTRAVENOUS | Status: AC

## 2024-01-04 NOTE — H&P (Signed)
 Kristen Padilla is 02/27/1991  Patient's last menstrual period was 07/03/2023 (approximate). [redacted]w[redacted]d  Pt presents with Vaginal bleeding in pregnancy, second trimester [O46.92]  Pregnancy complicated by Pt was admitted at 24 weeks with abruption and bleeding off and on.  She left yesterday AMA.  Now she returns with bleeding and contractions PMHX  Past Medical History:  Diagnosis Date   Anxiety, generalized    Bupropion  overdose 01/12/2023   Wellbutrin  OD suicide attempt.     Closed fracture of left distal radius 03/29/2020   Added automatically from request for surgery [id number]     Depression    Leukocytosis 05/21/2020   Miscarriage    Morbid obesity (HCC)    Overdose 05/22/2020   Placental abruption in third trimester 06/28/2022   Second trimester bleeding 06/17/2022   Suicidal behavior with attempted self-injury (HCC) 2014   ibuprofen  OD   Suicidal behavior with attempted self-injury (HCC) 2022   tylenol  and NyQuil OD   Tylenol  overdose, intentional self-harm, initial encounter (HCC) 05/21/2020   UTI (urinary tract infection)    Vaginal bleeding in pregnancy, third trimester 06/27/2022    PSURG HX  Past Surgical History:  Procedure Laterality Date   CESAREAN SECTION N/A 06/29/2022   Procedure: CESAREAN SECTION;  Surgeon: Gloriann Chick, MD;  Location: [facility] LD [facility]S;  Service: Obstetrics;  Laterality: N/A;   HERNIA REPAIR     LAPAROSCOPIC GASTRIC SLEEVE RESECTION     Repair of displaced fracture of the left radius  03/2020    Fam HX  Family History  Problem Relation Age of Onset   Diabetes Mother    Hypertension Mother    Depression Mother    Heart attack Mother    Hypertension Father     Soc HX  Social History   Socioeconomic History   Marital status: Married    Spouse name: Not on file   Number of children: 1   Years of education: Not on file   Highest education level: High school graduate  Occupational History   Not on file  Tobacco Use   Smoking  status: Never   Smokeless tobacco: Never  Vaping Use   Vaping status: Never Used  Substance and Sexual Activity   Alcohol use: No   Drug use: No   Sexual activity: Yes    Birth control/protection: None  Other Topics Concern   Not on file  Social History Narrative   Not on file   Social Drivers of Health   Financial Resource Strain: Low Risk  (06/05/2020)   Overall Financial Resource Strain (CARDIA)    Difficulty of Paying Living Expenses: Not very hard  Food Insecurity: No Food Insecurity (12/25/2023)   Hunger Vital Sign    Worried About Running Out of Food in the Last Year: Never true    Ran Out of Food in the Last Year: Never true  Transportation Needs: No Transportation Needs (12/25/2023)   PRAPARE - Administrator, Civil Service (Medical): No    Lack of Transportation (Non-Medical): No  Physical Activity: Insufficiently Active (08/10/2020)   Exercise Vital Sign    Days of Exercise per Week: 2 days    Minutes of Exercise per Session: 50 min  Stress: Stress Concern Present (06/05/2020)   [employer] of Occupational Health - Occupational Stress Questionnaire    Feeling of Stress : Very much  Social Connections: Unknown (11/11/2021)   Received from [facility]   Social Network    Social Network: Not  on file  Intimate Partner Violence: Not At Risk (12/25/2023)   Humiliation, Afraid, Rape, and Kick questionnaire    Fear of Current or Ex-Partner: No    Emotionally Abused: No    Physically Abused: No    Sexually Abused: No      Gen WDWN  IN NAD CV RRR LUNGS CTAB ABD Gravid soft NT GU pt with some bleeding and cx unchanged EXT no evidence of DVT A/P: IUP [redacted]w[redacted]d With bleeding and 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  Pt left AMA yesterday

## 2024-01-04 NOTE — MAU Provider Note (Signed)
 History     CSN: [id number]  Arrival date and time: 01/04/24 2107   None     Chief Complaint  Patient presents with   Vaginal Bleeding   HPI Patient is a 33 year old G8 P1-1-5-2 at 25 weeks 4 days presenting for vaginal bleeding in the second trimester.  Patient has been admitted multiple times for vaginal bleeding in pregnancy.  She has history of a 31-week placental abruption with emergency C-section.  She was hospitalized last week but left AGAINST MEDICAL ADVICE on Friday 7/4.  Told the provider if she started bleeding and she would return and she started bleeding again today.  Reports episodes of what she thinks may be contractions throughout the day but not having any right now.  No other symptoms.  OB History     Gravida  8   Para  2   Term  1   Preterm  1   AB  5   Living  2      SAB  5   IAB      Ectopic      Multiple  0   Live Births  2        Obstetric Comments  2023 breech, partial abruption         Past Medical History:  Diagnosis Date   Anxiety, generalized    Bupropion  overdose 01/12/2023   Wellbutrin  OD suicide attempt.     Closed fracture of left distal radius 03/29/2020   Added automatically from request for surgery [id number]     Depression    Leukocytosis 05/21/2020   Miscarriage    Morbid obesity (HCC)    Overdose 05/22/2020   Placental abruption in third trimester 06/28/2022   Second trimester bleeding 06/17/2022   Suicidal behavior with attempted self-injury (HCC) 2014   ibuprofen  OD   Suicidal behavior with attempted self-injury (HCC) 2022   tylenol  and NyQuil OD   Tylenol  overdose, intentional self-harm, initial encounter (HCC) 05/21/2020   UTI (urinary tract infection)    Vaginal bleeding in pregnancy, third trimester 06/27/2022    Past Surgical History:  Procedure Laterality Date   CESAREAN SECTION N/A 06/29/2022   Procedure: CESAREAN SECTION;  Surgeon: Gloriann Chick, MD;  Location: [facility] LD [facility]S;  Service: Obstetrics;   Laterality: N/A;   HERNIA REPAIR     LAPAROSCOPIC GASTRIC SLEEVE RESECTION     Repair of displaced fracture of the left radius  03/2020    Family History  Problem Relation Age of Onset   Diabetes Mother    Hypertension Mother    Depression Mother    Heart attack Mother    Hypertension Father     Social History   Tobacco Use   Smoking status: Never   Smokeless tobacco: Never  Vaping Use   Vaping status: Never Used  Substance Use Topics   Alcohol use: No   Drug use: No    Allergies:  Allergies  Allergen Reactions   Progesterone  Swelling    Vaginal swelling when taken vaginally   Aripiprazole Rash   Prenatal Vitamins Rash    States she got a rash from taking the prenatal the hospital offers    Sulfa Antibiotics Rash    Medications Prior to Admission  Medication Sig Dispense Refill Last Dose/Taking   cyclobenzaprine  (FLEXERIL ) 5 MG tablet Take 1 tablet (5 mg total) by mouth 3 (three) times daily as needed for muscle spasms. (Patient not taking: Reported on 12/02/2023) 20 tablet 0    metroNIDAZOLE  (FLAGYL )  500 MG tablet Take 1 tablet (500 mg total) by mouth 2 (two) times daily. (Patient not taking: Reported on 12/23/2023) 14 tablet 0     Review of Systems  Gastrointestinal:  Positive for abdominal pain.  Genitourinary:  Positive for vaginal bleeding.   Physical Exam   Blood pressure 108/65, pulse 96, temperature 98.2 F (36.8 C), temperature source Oral, resp. rate 16, height 5' 2 (1.575 m), weight 83.8 kg, last menstrual period 07/03/2023, SpO2 99%, not currently breastfeeding.  Physical Exam Vitals and nursing note reviewed.  Constitutional:      Appearance: Normal appearance.  HENT:     Head: Normocephalic and atraumatic.     Nose: No congestion or rhinorrhea.  Eyes:     Extraocular Movements: Extraocular movements intact.  Cardiovascular:     Rate and Rhythm: Normal rate.  Pulmonary:     Effort: Pulmonary effort is normal.  Abdominal:     Palpations:  Abdomen is soft.     Tenderness: There is no abdominal tenderness.  Genitourinary:    Comments: 1/thick/-2.  Cephalic.  Small clot came out on glove Musculoskeletal:        General: Normal range of motion.     Cervical back: Normal range of motion.  Skin:    General: Skin is warm.     Capillary Refill: Capillary refill takes less than 2 seconds.  Neurological:     General: No focal deficit present.     Mental Status: She is alert.     Cranial Nerves: No cranial nerve deficit.  Psychiatric:        Mood and Affect: Mood normal.        Behavior: Behavior normal.     MAU Course  Procedures  MDM Physical exam NST  Assessment and Plan  Kristen Padilla is a 33 yo H1E8847 at 25 weeks 4 days presenting for vaginal bleeding in the second trimester  Vaginal bleeding Patient presenting for worsening vaginal bleeding.  Has been admitted multiple times and left AGAINST MEDICAL ADVICE 48 hours ago but told the provider that if she had worsening bleeding she would return.  Reports that she noticed some contractions earlier today.  Clots noted on pad, imaging and media tab.  SVE 1/thick/-2.  NST reassuring.  Spoke with on-call provider who reports she will admit her for continued monitoring.  Kristen Padilla 01/04/2024, 9:56 PM

## 2024-01-04 NOTE — MAU Note (Signed)
 Kristen Padilla is a 33 y.o. at 101w4d here in MAU reporting: heavy vaginal bleeding that started about 30 min - 1 hour ago. States that she was seen for vaginal bleeding on Friday. But left AMA. States that she has anterior placenta. +FM. Denies LOF.   Onset of complaint: 30 min -1 hr Pain score: 6 There were no vitals filed for this visit.   FHT: 145  Lab orders placed from triage: urine

## 2024-01-05 ENCOUNTER — Other Ambulatory Visit

## 2024-01-05 ENCOUNTER — Inpatient Hospital Stay (HOSPITAL_BASED_OUTPATIENT_CLINIC_OR_DEPARTMENT_OTHER)

## 2024-01-05 DIAGNOSIS — O328XX0 Maternal care for other malpresentation of fetus, not applicable or unspecified: Secondary | ICD-10-CM

## 2024-01-05 DIAGNOSIS — O09212 Supervision of pregnancy with history of pre-term labor, second trimester: Secondary | ICD-10-CM

## 2024-01-05 DIAGNOSIS — O34212 Maternal care for vertical scar from previous cesarean delivery: Secondary | ICD-10-CM

## 2024-01-05 DIAGNOSIS — O4692 Antepartum hemorrhage, unspecified, second trimester: Secondary | ICD-10-CM

## 2024-01-05 DIAGNOSIS — Z3A25 25 weeks gestation of pregnancy: Secondary | ICD-10-CM

## 2024-01-05 LAB — RH IG WORKUP (INCLUDES ABO/RH)
Gestational Age(Wks): 25
Unit division: 0

## 2024-01-05 LAB — CBC
HCT: 28.7 % — ABNORMAL LOW (ref 36.0–46.0)
HCT: 29.8 % — ABNORMAL LOW (ref 36.0–46.0)
HCT: 29.3 % — ABNORMAL LOW (ref 36.0–46.0)
HCT: 26 % — ABNORMAL LOW (ref 36.0–46.0)
Hemoglobin: 9 g/dL — ABNORMAL LOW (ref 12.0–15.0)
Hemoglobin: 9.1 g/dL — ABNORMAL LOW (ref 12.0–15.0)
Hemoglobin: 8.7 g/dL — ABNORMAL LOW (ref 12.0–15.0)
Hemoglobin: 8.2 g/dL — ABNORMAL LOW (ref 12.0–15.0)
MCH: 25.1 pg — ABNORMAL LOW (ref 26.0–34.0)
MCH: 24.7 pg — ABNORMAL LOW (ref 26.0–34.0)
MCH: 25.6 pg — ABNORMAL LOW (ref 26.0–34.0)
MCH: 25.6 pg — ABNORMAL LOW (ref 26.0–34.0)
MCHC: 31.4 g/dL (ref 30.0–36.0)
MCHC: 30.5 g/dL (ref 30.0–36.0)
MCHC: 29.7 g/dL — ABNORMAL LOW (ref 30.0–36.0)
MCHC: 31.5 g/dL (ref 30.0–36.0)
MCV: 80.2 fL (ref 80.0–100.0)
MCV: 81 fL (ref 80.0–100.0)
MCV: 86.2 fL (ref 80.0–100.0)
MCV: 81.3 fL (ref 80.0–100.0)
Platelets: 305 K/uL (ref 150–400)
Platelets: 317 K/uL (ref 150–400)
Platelets: 290 K/uL (ref 150–400)
Platelets: 288 K/uL (ref 150–400)
RBC: 3.58 MIL/uL — ABNORMAL LOW (ref 3.87–5.11)
RBC: 3.68 MIL/uL — ABNORMAL LOW (ref 3.87–5.11)
RBC: 3.4 MIL/uL — ABNORMAL LOW (ref 3.87–5.11)
RBC: 3.2 MIL/uL — ABNORMAL LOW (ref 3.87–5.11)
RDW: 22.5 % — ABNORMAL HIGH (ref 11.5–15.5)
RDW: 22.6 % — ABNORMAL HIGH (ref 11.5–15.5)
RDW: 22.6 % — ABNORMAL HIGH (ref 11.5–15.5)
RDW: 22.3 % — ABNORMAL HIGH (ref 11.5–15.5)
WBC: 12.8 K/uL — ABNORMAL HIGH (ref 4.0–10.5)
WBC: 10.9 K/uL — ABNORMAL HIGH (ref 4.0–10.5)
WBC: 11.4 K/uL — ABNORMAL HIGH (ref 4.0–10.5)
WBC: 10.7 K/uL — ABNORMAL HIGH (ref 4.0–10.5)
nRBC: 0 % (ref 0.0–0.2)
nRBC: 0 % (ref 0.0–0.2)
nRBC: 0 % (ref 0.0–0.2)
nRBC: 0 % (ref 0.0–0.2)

## 2024-01-05 LAB — KLEIHAUER-BETKE STAIN
# Vials RhIg: 1
Fetal Cells %: 0 %
Quantitation Fetal Hemoglobin: 0 mL

## 2024-01-05 LAB — DIC (DISSEMINATED INTRAVASCULAR COAGULATION)PANEL
D-Dimer, Quant: 0.57 ug{FEU}/mL — ABNORMAL HIGH (ref 0.00–0.50)
Fibrinogen: 548 mg/dL — ABNORMAL HIGH (ref 210–475)
INR: 1 (ref 0.8–1.2)
Platelets: 313 K/uL (ref 150–400)
Prothrombin Time: 14.1 s (ref 11.4–15.2)
Smear Review: NONE SEEN
aPTT: 24 s (ref 24–36)

## 2024-01-05 LAB — MAGNESIUM
Magnesium: 4.6 mg/dL — ABNORMAL HIGH (ref 1.7–2.4)
Magnesium: 5.1 mg/dL — ABNORMAL HIGH (ref 1.7–2.4)

## 2024-01-05 MED ORDER — LACTATED RINGERS IV BOLUS
500.0000 mL | Freq: Once | INTRAVENOUS | Status: AC
  Administered 2024-01-05: 500 mL via INTRAVENOUS

## 2024-01-05 MED ORDER — POLYSACCHARIDE IRON COMPLEX 150 MG PO CAPS
150.0000 mg | ORAL_CAPSULE | ORAL | Status: DC
  Filled 2024-01-05 (×2): qty 1

## 2024-01-05 NOTE — Progress Notes (Addendum)
 [facility] Day #1  Kristen Padilla is a 33 y.o. H1E8847 at [redacted]w[redacted]d by readmitted for vaginal bleeding in 2nd trimester after leaving AMA on 7/5  Called to the bedside by RN who reports QBL 238cc over the last hour with a blood clot the size of a fist. Patient resting in bed. Reports heavy vaginal bleeding over the last hour and feeling lightheaded when ambulating to the bathroom. Continuous EFM shows FHT 130s with moderate variability and no decels.   Dr. Henry notified of patient status and en route to the hospital for evaluation. STAT CBC, DIC panel, fibrinogen , and cross matched for 2 units PRBCs. Dr. Kizzie notified of patient status and recommends waiting for labs and if labs unstable, proceed with delivery.   NPO and bedrest with bedpan until labs result and bleeding stabilizes.   Alan MARLA Molt, MSN, CNM 01/05/2024, 10:34 AM

## 2024-01-05 NOTE — Progress Notes (Signed)
 Patient ID: Kristen Padilla, female   DOB: Sep 10, 1990, 33 y.o.   MRN: [id number] Kristen Padilla is a 33 y.o. H1E8847 at [redacted]w[redacted]d admitted for third trimester bleeding  Hospital Day No: 2 readmit  Subjective: Called by Alan Molt, CNM d/t being called by RN with reports of pt with QBL 253cc and still bleeding.  Objective: BP 108/63   Pulse (!) 104   Temp 97.8 F (36.6 C) (Oral)   Resp 20   Ht 5' 2 (1.575 m)   Wt 83.8 kg   LMP 07/03/2023 (Approximate)   SpO2 100%   BMI 33.78 kg/m  I/O last 3 completed shifts: In: 1711.4 [P.O.:240; I.V.:1471.4] Out: 1200 [Urine:1200] Total I/O In: 590 [P.O.:240; I.V.:350] Out: 200 [Urine:200]  Physical Exam:  Gen: alert and slightly pale, calm now Chest/Lungs: unlabored  Heart/Pulse: RRR  Abdomen: soft, gravid, nontender, BX x4 quad Uterine fundus: soft, nontender Skin & Color: warm and dry  Neurological: AOx3 EXT: negative Homan's b/l, edema none  FHT:  FHR: 135 bpm, variability: min-mod,  accelerations:   rare acceleration,  decelerations:  none UC:   not picking up SVE:   Dilation: FT Effacement (%): Thick Exam by:: Jon Rummer, MD  Spec no active bleeding, small clot noted and brown blood in vagina  Labs: Lab Results  Component Value Date   WBC 12.8 (H) 01/05/2024   HGB 9.0 (L) 01/05/2024   HCT 28.7 (L) 01/05/2024   MCV 80.2 01/05/2024   PLT 305 01/05/2024    Assessment and Plan: has Depression; GAD (generalized anxiety disorder); PTSD (post-traumatic stress disorder); Rh negative, antepartum; Vaginal bleeding in pregnancy, second trimester; Overdose, intentional self-harm, initial encounter [facility]); MDD (major depressive disorder); Adjustment disorder with depressed mood; Obesity affecting pregnancy, antepartum; History of prior pregnancy with short cervix, currently pregnant; History of sleeve gastrectomy; History of multiple spontaneous abortions; History of shoulder dystocia in prior pregnancy,  currently pregnant; History of preterm delivery, currently pregnant; History of placenta abruption; [redacted] weeks gestation of pregnancy; and Previous cesarean delivery, antepartum condition or complication on their problem list.  Hgb stable at 9.1.  Will cont to monitor.  Cont Mg for neuroprophylaxis and hopefully will help reduce ctxs as well. C/o cramping q60min. 500cc IVF bolus. Consented for repeat c-section. Risks benefits alternatives reviewed including but not limited to bleeding infection and injury.  Questions answered.  Consent signed. Continue monitoring for now Fetal status overall reassuring SW consult pending   Jon CINDERELLA Rummer 01/05/2024, 11:01 AM

## 2024-01-05 NOTE — Progress Notes (Signed)
 [facility] Day #1  Kristen Padilla is a 33 y.o. H1E8847 at [redacted]w[redacted]d by readmitted for vaginal bleeding in 2nd trimester after leaving AMA on 7/5.  Checked on patient in room. Patient and RN report tangerine size clot when patient got up the bathroom. Scant bleeding on pad. Patient reports intermittent lower abdominal cramping. States she feels dizzy and lightheaded. Last Hgb 9.1 at 1024.   Continuous EFM shows FHT 130s with moderate variability and no decels.   Remains NPO and bedrest with bathroom privileges.   Plan repeat CBC now. If stable, repeat in the morning unless clinical status changes. Continue magnesium  for neuro protection.   Dr. Henry updated on patient status and plan of care.   Alan MARLA Molt, MSN, CNM 01/05/2024, 5:21 PM

## 2024-01-05 NOTE — Social Work (Addendum)
 CSW received consult for Pt with chronic abruption and left AMA. Now returns with bleeding again.  May need help with child care at home. CSW met with MOB to offer support and complete assessment.   CSW met with MOB to complete an assessment and noted that MOB was on Magnesium . RN was present in the room at the time of the visit. MOB consented for CSW to speak with RN present. CSW explained the purpose of the visit and acknowledged that MOB was currently receiving Magnesium  sulfate. CSW informed MOB that a full assessment would be completed once she was no longer on Magnesium . MOB verbalized understanding. CSW acknowledged previously noted concerns indicating that MOB may need childcare assistance. MOB reported that her children (Aleah,32 year old & Marsa 23 year-old) are currently staying with their father Domenica Polite). CSW inquired about the safety of her children in their father's care, referencing documentation that MOB had left AMA due to her child being in the care of an intoxicated spouse. MOB reported that her spouse was not intoxicated at the time and stated that people lied about her spouse being intoxicated. CSW assessed for safety concerns. MOB denied safety concerns and stated her children are safe. CSW assessed for additional needs at this time. MOB reported none.   CSW will continue to follow this patient and compete a full assessment once MOB is no longer on Magnesium .   Nat Quiet, MSW, LCSW Clinical Social Worker  [phone] 01/05/2024  12:37 PM

## 2024-01-06 LAB — GLUCOSE, 1 HOUR GESTATIONAL: Glucose Tolerance, 1 hour: 187 mg/dL

## 2024-01-06 LAB — CBC
HCT: 25.9 % — ABNORMAL LOW (ref 36.0–46.0)
Hemoglobin: 8 g/dL — ABNORMAL LOW (ref 12.0–15.0)
MCH: 25.1 pg — ABNORMAL LOW (ref 26.0–34.0)
MCHC: 30.9 g/dL (ref 30.0–36.0)
MCV: 81.2 fL (ref 80.0–100.0)
Platelets: 298 K/uL (ref 150–400)
RBC: 3.19 MIL/uL — ABNORMAL LOW (ref 3.87–5.11)
RDW: 22.7 % — ABNORMAL HIGH (ref 11.5–15.5)
WBC: 9.9 K/uL (ref 4.0–10.5)
nRBC: 0 % (ref 0.0–0.2)

## 2024-01-06 NOTE — Progress Notes (Signed)
 Pt seen and reported minimal spotting.  Will recheck CBC in AM.  She feels better since Mg was discontinued.  I consented pt for a c-section on Monday and did not include sterilization which she also desires.  Says she doesn't want to go through this again.  Sterilization added to hospital consent form after reviewing risks benefits and alternatives including but not limited to bleeding infection and injury questions answered and consent signed and witnessed.  Pt also signed medicaid consent form as well.  Reinforced wearing SCDs for DVT prophylaxis.

## 2024-01-06 NOTE — Plan of Care (Signed)

## 2024-01-06 NOTE — Clinical SW OB High Risk (Signed)
 [facility]ty [facility]  Clinical Social Worker:  Nat DELENA Quiet, [place] Date/Time:  01/06/2024, 10:49 AM Gestational Age on Admission:  33 y.o. Admitting Diagnosis:  Patient presents with vaginal bleeding in the pregnancy, second trimester    Expected Delivery Date:  01/06/24  Family/Home Environment  Home Address:  [address]  [place] , [place] [address]   Household Member/Support Name: Fredderick Snowman  Relationship: Significant Other  Other Support:  Husband: Sheree Polite    Psychosocial Data  Employment:     Type of Work:    Education: 9 to 11 years  Cultural/Environment Issues Impacting Care: MOB left the hospital AMA due to concerns that her child was in the care of an intoxicated spouse. During the assessment with CSW on 7/7, MOB denied that her spouse was intoxicated and that people lied. She confirmed that her children were safe.    Strengths/Weaknesses/Factors to Consider  Concerns Related to Hospitalization:  Patient admitted at 24 weeks with abruption and bleeding off and on. She left AMA.    Previous Pregnancies/Feelings Towards Pregnancy?  Concerns related to being/becoming a mother?:  MOB expressed that she is feeling good about having another baby and that FOB is very excited about their baby.   Social Support (FOB? Who is/will be helping with baby/other kids?): Spouse/significant other    Recent Stressful Life Events (life changes in past year?):  Of note, MOB was hospitalized for overdose July 2024. MOB declined to discuss her mental health history and stated that she felt comfortable speaking with her provider about her mental health if she had concerns.    Prenatal Care/Education/Home Preparations: MOB reported that she would be able to get essential baby items.    Domestic Violence (of any type):  No If Yes to Domestic Violence, Describe/Action Plan:  none    Substance Use During Pregnancy: Not accessed.   Clinical Assessment/Plan:  CSW met with MOB  at bedside and reintroduced CSW role. MOB appeared calm and was lying in bed on her phone. She receptive to the visit. MOB confirmed that her demographic information on hospital file was correct. MOB stated that the father of the baby is not her spouse Domenica Polite). She and her spouse, Sheree, are currently separated but remain legally married. The father of the baby is Fredderick Nora, with whom she currently resides. CSW inquired about MOB's support system. MOB identified both her significant other Magdaline) and her spouse Domenica) as her primary support system. CSW asked MOB if she received SNAP/WIC benefits. MOB reported that she was not interested in applying for SNAP/WIC benefits at this time. CSW asked MOB how she had been doing. MOB reported that she was feeling better. CSW inquired about MOB's children. MOB stated that her son would be staying with her father, Leafy Sames), during the day and her spouse, Domenica), during the evening. MOB shared that she and her spouse have a custody agreement established through the court, in which she has custody of her son from Tuesday through Friday. CSW asked if there was CPS involvement and MOB denied CPS involvement. MOB clarified that her daughter was not with her spouse's care yesterday and stated that the situation was complicated. She declined to provide further details. MOB reported that her children are safe.  CSW asked MOB how she felt about her pregnancy. MOB expressed feeling good about her pregnancy and shared that FOB was very excited about their baby. CSW inquired about MOB's mental health history. MOB declined to discuss her mental  health history. CSW asked if she felt comfortable with speaking with to her providers about her mental health if she had concerns. MOB reported that she felt comfortable discussing her concerns with her providers. CSW assessed MOB for safety. MOB denied having any current thoughts of harming herself and others. MOB denied domestic  violence.  CSW informed MOB that social work would see MOB once she delivered to offer additional resources. MOB verbalized understanding. Nat Quiet, MSW, LCSW Clinical Social Worker  [phone] 01/06/2024  11:51 AM

## 2024-01-06 NOTE — Progress Notes (Signed)
  Subjective:  Minimal, mild cramping during the night and perceives bleeding as lighter. Does not perceive contractions. Reports active FM and denies LOF. Called by RN and Pt states son is home and safe w. Her spouse. She is doing well mentally since returning to the hospital.   Objective:    Intake/Output Summary (Last 24 hours) at 01/06/2024 0657 Last data filed at 01/06/2024 0600 Gross per 24 hour  Intake 4309.61 ml  Output 4706 ml  Net -396.39 ml   QBL from 7/7-7/8  634 mL  Physical Exam: VS: Blood pressure (!) 87/54, pulse 84, temperature 98.2 F (36.8 C), temperature source Oral, resp. rate 20, height 5' 2 (1.575 m), weight 83.8 kg, last menstrual period 07/03/2023, SpO2 100%, not currently breastfeeding. AAO x3, no signs of distress Cardiovascular: no edema Respiratory: Unlabored GU/GI: Abdomen gravid, non-tender, non-distended, active FM Extremities:negative for pain, tenderness, and cords  Cervical exam:Dilation: 1 Effacement (%): Thick Exam by:: Steffan Rover, MD FHR: baseline rate 125 / variability moderate / accelerations 10x10 / absent decelerations TOCO: none tracing   Labs:   Recent Labs    01/05/24 2229 01/06/24 0430  WBC 10.7* 9.9  HGB 8.2* 8.0*  HCT 26.0* 25.9*  PLT 288 298    Assessment / Plan: 33 y.o. AGP@ [redacted]w[redacted]d  Chronic abruption    -QBL since readmission 634 mL    -Hgb 8.2 @ 2229 on 7/7    -magnesium  sulfate off at 0700 mag level 5.1 Rh neg    -O neg, [facility], fetus confirmed RH positive, per Dr. Margaretta note, no Rhogam should be given for 12 weeks post Rhogam on 12/04/23  Fetal status    -NST appropriate for gestational age    -DC continuous EFM after mag stops    -NST Q shift    -BMZ complete on 12/26/23    -U/S 01/05/24 cephalic/anterior placenta/ AFI 12.9    -NICU consult completed on last admission IDA     -S/P IV Venofer  12/25/23. S/P one unit of PRBCs on 6/29    -now on oral iron  Psych/Social hx     -Suicide attempt via overdose in 2021 (Tylenol  and NyQuil per ED note) and 2024 (Bupropion )     -No current meds    -Plan close postpartum F/U Plan reviewed with Dr.. Henry Mercer Peal DNP, CNM 01/06/2024, 6:55 AM

## 2024-01-07 ENCOUNTER — Ambulatory Visit

## 2024-01-07 LAB — CBC
HCT: 26 % — ABNORMAL LOW (ref 36.0–46.0)
Hemoglobin: 8 g/dL — ABNORMAL LOW (ref 12.0–15.0)
MCH: 25.2 pg — ABNORMAL LOW (ref 26.0–34.0)
MCHC: 30.8 g/dL (ref 30.0–36.0)
MCV: 81.8 fL (ref 80.0–100.0)
Platelets: 303 K/uL (ref 150–400)
RBC: 3.18 MIL/uL — ABNORMAL LOW (ref 3.87–5.11)
RDW: 23 % — ABNORMAL HIGH (ref 11.5–15.5)
WBC: 9.7 K/uL (ref 4.0–10.5)
nRBC: 0 % (ref 0.0–0.2)

## 2024-01-07 IMAGING — CT CT ABD-PELV W/ CM
2 of 4 series · 16 of 46 positions shown, 18 images · IV contrast (APPLIED)
Comparison: CT 07/10/2021

CLINICAL DATA: Acute abdominal pain.

EXAM:
CT ABDOMEN AND PELVIS WITH CONTRAST
TECHNIQUE: Multidetector CT imaging of the abdomen and pelvis was performed
using the standard protocol following bolus administration of
intravenous contrast.

[Series 2: abd pel w · axial · 0.98mm/px · z∈[-497,+3]mm · 13 of 110 slices shown, 15 images]
[im 5/110  soft-tissue]
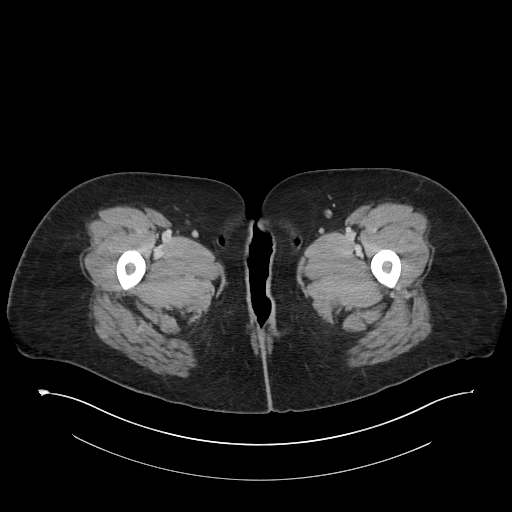
[im 5/110  bone]
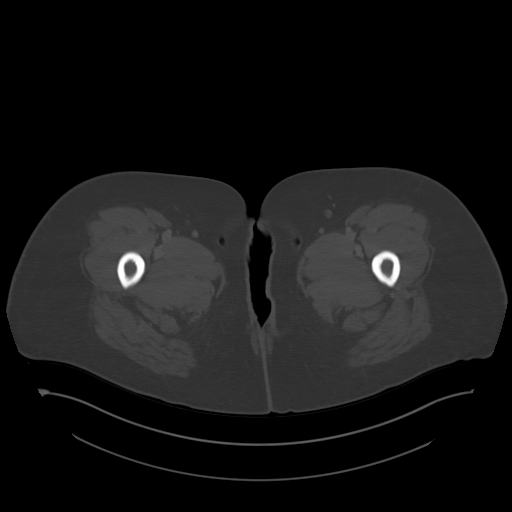
[im 13/110  soft-tissue]
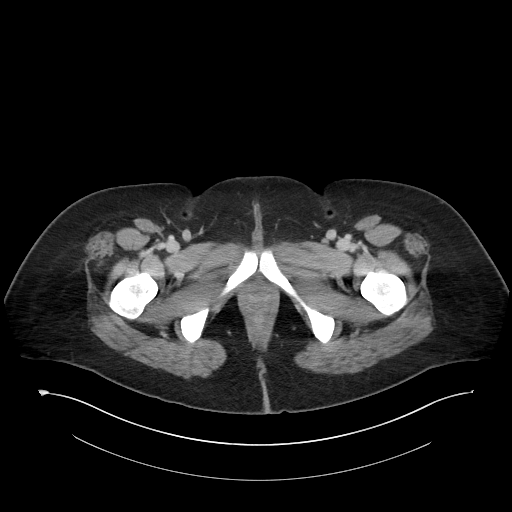
[im 21/110  soft-tissue]
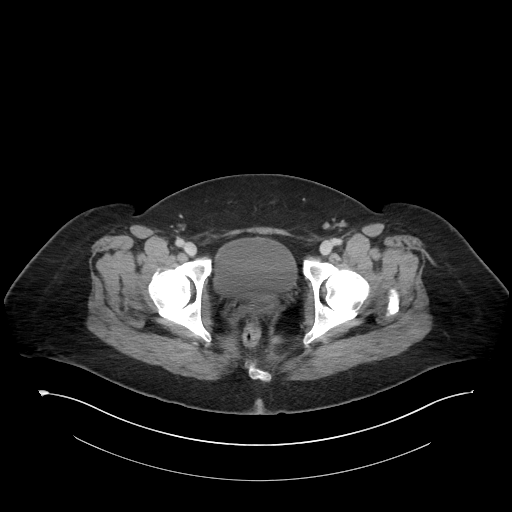
[im 30/110  soft-tissue]
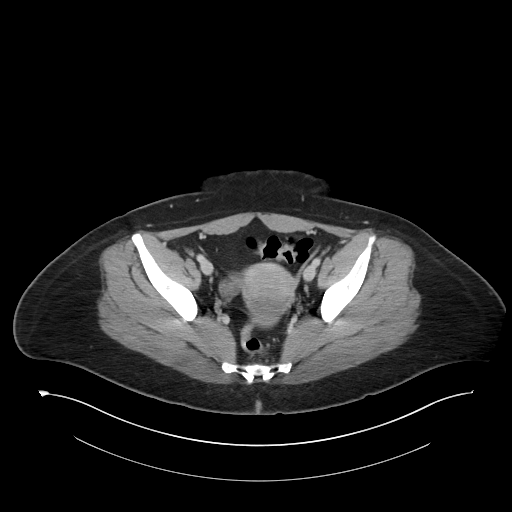
[im 38/110  soft-tissue]
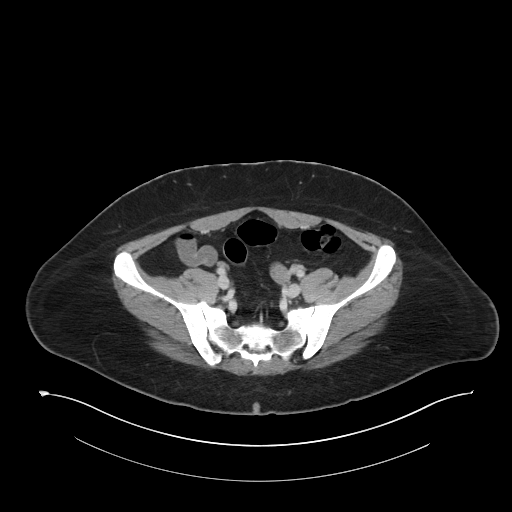
[im 47/110  soft-tissue]
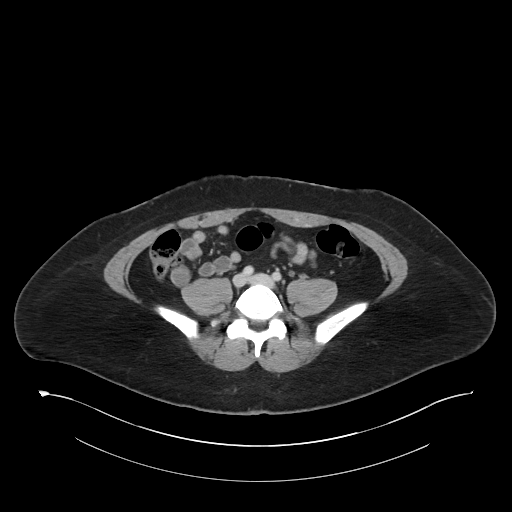
[im 55/110  soft-tissue]
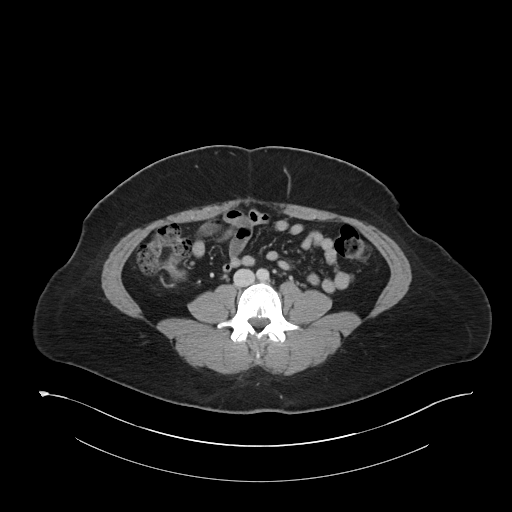
[im 63/110  soft-tissue]
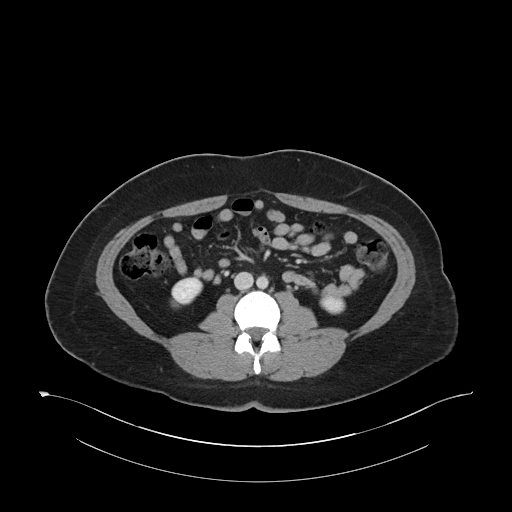
[im 72/110  soft-tissue]
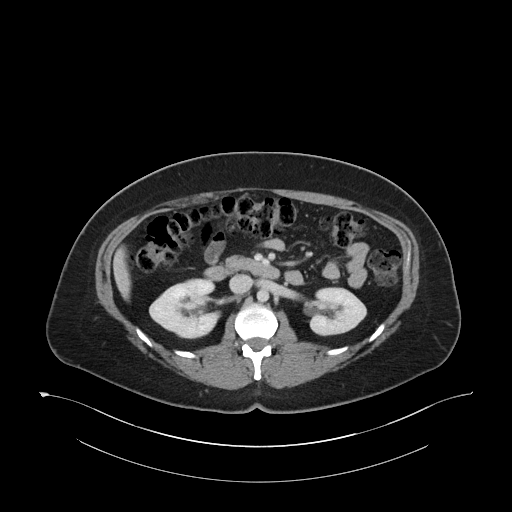
[im 72/110  bone]
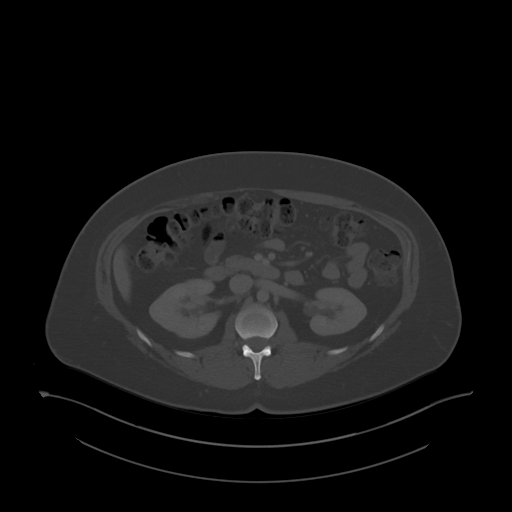
[im 80/110  soft-tissue]
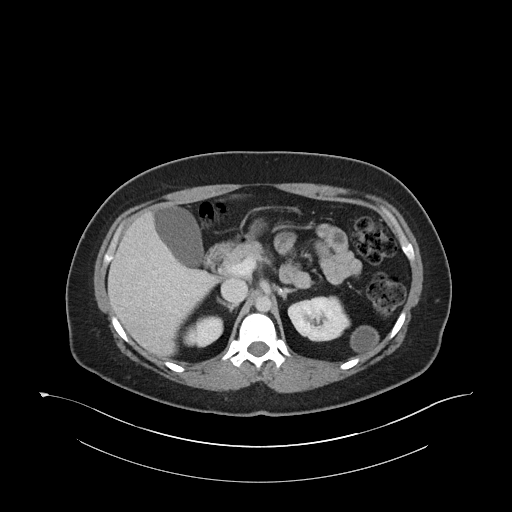
[im 89/110  soft-tissue]
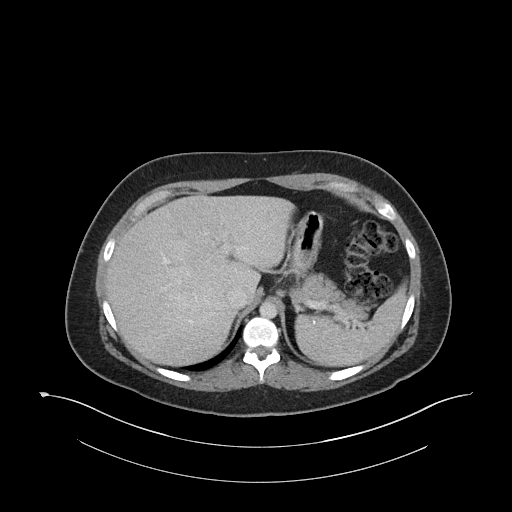
[im 97/110  soft-tissue]
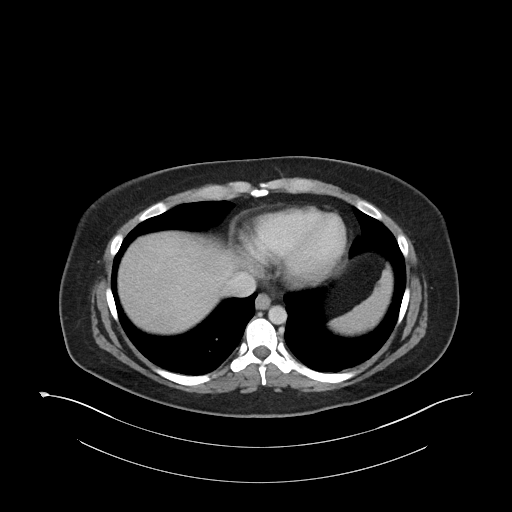
[im 105/110  soft-tissue]
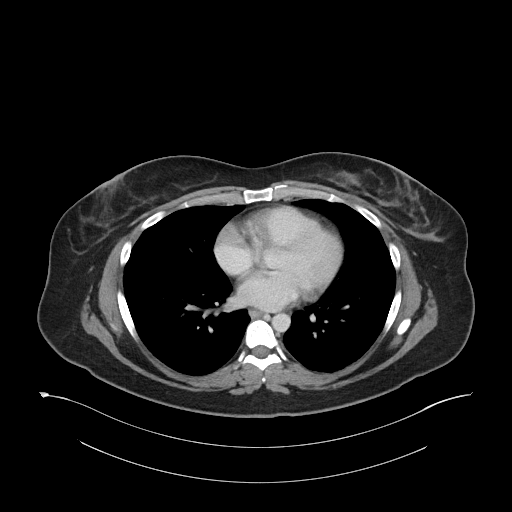

[Series 5: coronal · coronal · 0.96mm/px · 3 of 90 slices shown]
[im 30/90  soft-tissue]
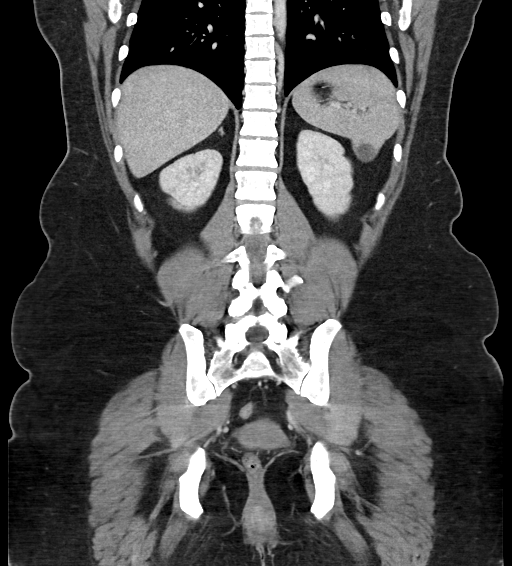
[im 40/90  soft-tissue]
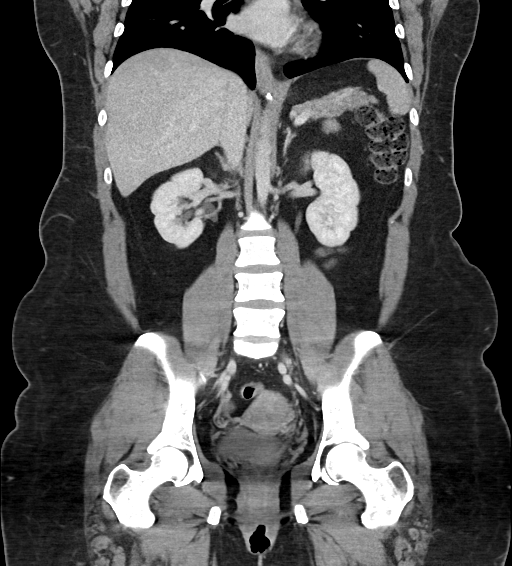
[im 50/90  soft-tissue]
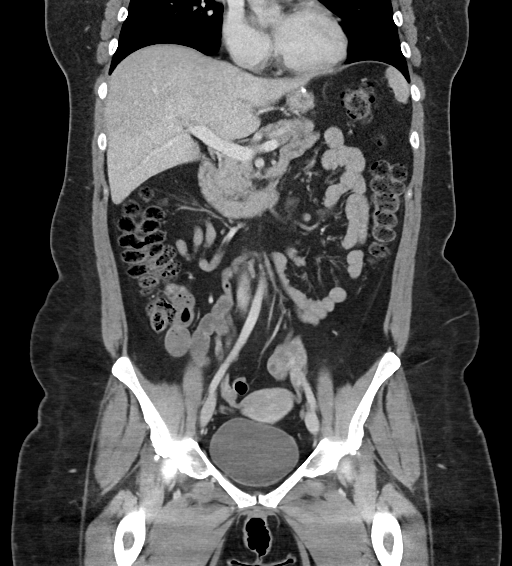

[16 of 46 positions shown; findings below may reference images not displayed]

RADIATION DOSE REDUCTION: This exam was performed according to the
departmental dose-optimization program which includes automated
exposure control, adjustment of the mA and/or kV according to
patient size and/or use of iterative reconstruction technique.

CONTRAST:  100mL OMNIPAQUE IOHEXOL 300 MG/ML  SOLN
FINDINGS: Lower chest: Minor hypoventilatory changes in the dependent lungs.
No pleural effusion.

Hepatobiliary: No focal liver abnormality is seen. No gallstones,
gallbladder wall thickening, or biliary dilatation.

Pancreas: Unremarkable. No pancreatic ductal dilatation or
surrounding inflammatory changes.

Spleen: 2.4 cm cyst with peripheral calcification in the inferior
spleen is unchanged allowing for differences in caliper placement.
No splenomegaly or new focal abnormality.

Adrenals/Urinary Tract: Normal adrenal glands. No hydronephrosis,
renal calculi, or focal renal lesion. Homogeneous renal enhancement.
Partially distended and unremarkable urinary bladder.

Stomach/Bowel: Gastric sleeve resection. Normal small bowel. No
obstruction or inflammation. Normal appendix. Moderate colonic stool
burden. No colonic inflammation.

Vascular/Lymphatic: Normal caliber abdominal aorta. Patent portal,
splenic, mesenteric veins. No acute vascular findings. No
abdominopelvic adenopathy.

Reproductive: Uterus and bilateral adnexa are unremarkable.

Other: No free air, free fluid, or intra-abdominal fluid collection.
Tiny fat containing umbilical hernia. Tiny fat containing
supraumbilical ventral abdominal wall hernia, series 6, image 68. No
inflammation of either of the hernia

Musculoskeletal: There are no acute or suspicious osseous
abnormalities.
IMPRESSION: 1. No acute abnormality in the abdomen/pelvis.
2. Tiny fat containing umbilical and supraumbilical ventral
abdominal wall hernias.
3. Moderate colonic stool burden, can be seen with constipation.

## 2024-01-07 NOTE — Progress Notes (Signed)
 Pt without complaints.  Small bleeding when she wipes  Good FM  BP (!) 107/52 (BP Location: Right Arm)   Pulse 88   Temp 97.6 F (36.4 C) (Oral)   Resp 17   Ht 5' 2 (1.575 m)   Wt 83.8 kg   LMP 07/03/2023 (Approximate)   SpO2 99%   BMI 33.78 kg/m   FHTS 130 and reactive  Toco none  Pt in NAD CV RRR Lungs CTAB abd  Gravid soft and NT GU no vb EXt no calf tenderness Results for orders placed or performed during the hospital encounter of 01/04/24 (from the past 72 hours)  Type and screen [facility]     Status: None   Collection Time: 01/04/24 10:10 PM  Result Value Ref Range   ABO/RH(D) O NEG    Antibody Screen NEG    Sample Expiration      01/07/2024,2359 Performed at [facility], [address] [place], [place] [address]   Rh IG workup (includes ABO/Rh)     Status: None   Collection Time: 01/04/24 10:10 PM  Result Value Ref Range   Gestational Age(Wks) 25    Unit Number [id number]    Blood Component Type RHIG    Unit division 00    Status of Unit REL FROM [facility]    Transfusion Status      OK TO TRANSFUSE Performed at [facility], [address], [place], [place] [address]   Kleihauer-Betke stain     Status: None   Collection Time: 01/04/24 10:13 PM  Result Value Ref Range   Fetal Cells % 0 %   Quantitation Fetal Hemoglobin 0.0000 mL    Comment: UP TO 15 MLS   # Vials RhIg 1     Comment: Performed at [facility], [address] [place], [place] [address]  Magnesium      Status: Abnormal   Collection Time: 01/05/24  5:00 AM  Result Value Ref Range   Magnesium  4.6 (H) 1.7 - 2.4 mg/dL    Comment: Performed at [facility], [address], [place], [place] [address]  CBC     Status: Abnormal   Collection Time: 01/05/24  5:00 AM  Result Value Ref Range   WBC 12.8 (H) 4.0 - 10.5 K/uL   RBC 3.58 (L) 3.87 - 5.11 MIL/uL   Hemoglobin 9.0 (L) 12.0 - 15.0 g/dL   HCT 71.2 (L) 63.9 - 53.9 %   MCV 80.2 80.0 - 100.0  fL   MCH 25.1 (L) 26.0 - 34.0 pg   MCHC 31.4 30.0 - 36.0 g/dL   RDW 77.4 (H) 88.4 - 84.4 %   Platelets 305 150 - 400 K/uL   nRBC 0.0 0.0 - 0.2 %    Comment: Performed at [facility], [address], [place], [place] [address]  CBC     Status: Abnormal   Collection Time: 01/05/24 10:24 AM  Result Value Ref Range   WBC 10.9 (H) 4.0 - 10.5 K/uL   RBC 3.68 (L) 3.87 - 5.11 MIL/uL   Hemoglobin 9.1 (L) 12.0 - 15.0 g/dL   HCT 70.1 (L) 63.9 - 53.9 %   MCV 81.0 80.0 - 100.0 fL   MCH 24.7 (L) 26.0 - 34.0 pg   MCHC 30.5 30.0 - 36.0 g/dL   RDW 77.3 (H) 88.4 - 84.4 %   Platelets 317 150 - 400 K/uL   nRBC 0.0 0.0 - 0.2 %  Comment: Performed at [facility], [address], [place], [place] [address]  DIC Panel ONCE - STAT     Status: Abnormal   Collection Time: 01/05/24 11:17 AM  Result Value Ref Range   Prothrombin Time 14.1 11.4 - 15.2 seconds   INR 1.0 0.8 - 1.2    Comment: (NOTE) INR goal varies based on device and disease states.    aPTT 24 24 - 36 seconds   Fibrinogen  548 (H) 210 - 475 mg/dL    Comment: (NOTE) Fibrinogen  results may be underestimated in patients receiving thrombolytic therapy.    D-Dimer, Quant 0.57 (H) 0.00 - 0.50 ug/mL-FEU    Comment: (NOTE) At the manufacturer cut-off value of 0.5 g/mL FEU, this assay has a negative predictive value of 95-100%.This assay is intended for use in conjunction with a clinical pretest probability (PTP) assessment model to exclude pulmonary embolism (PE) and deep venous thrombosis (DVT) in outpatients suspected of PE or DVT. Results should be correlated with clinical presentation.    Platelets 313 150 - 400 K/uL   Smear Review NO SCHISTOCYTES SEEN     Comment: Performed at [facility], [address], [place], [place] [address]  CBC     Status: Abnormal   Collection Time: 01/05/24  5:02 PM  Result Value Ref Range   WBC 11.4 (H) 4.0 - 10.5 K/uL   RBC 3.40 (L) 3.87 - 5.11 MIL/uL   Hemoglobin 8.7 (L) 12.0 -  15.0 g/dL   HCT 70.6 (L) 63.9 - 53.9 %   MCV 86.2 80.0 - 100.0 fL   MCH 25.6 (L) 26.0 - 34.0 pg   MCHC 29.7 (L) 30.0 - 36.0 g/dL   RDW 77.3 (H) 88.4 - 84.4 %   Platelets 290 150 - 400 K/uL   nRBC 0.0 0.0 - 0.2 %    Comment: Performed at [facility], [address], [place], [place] [address]  Magnesium      Status: Abnormal   Collection Time: 01/05/24 10:29 PM  Result Value Ref Range   Magnesium  5.1 (H) 1.7 - 2.4 mg/dL    Comment: Performed at [facility], [address] [place], [place] [address]  CBC     Status: Abnormal   Collection Time: 01/05/24 10:29 PM  Result Value Ref Range   WBC 10.7 (H) 4.0 - 10.5 K/uL   RBC 3.20 (L) 3.87 - 5.11 MIL/uL   Hemoglobin 8.2 (L) 12.0 - 15.0 g/dL   HCT 73.9 (L) 63.9 - 53.9 %   MCV 81.3 80.0 - 100.0 fL   MCH 25.6 (L) 26.0 - 34.0 pg   MCHC 31.5 30.0 - 36.0 g/dL   RDW 77.6 (H) 88.4 - 84.4 %   Platelets 288 150 - 400 K/uL   nRBC 0.0 0.0 - 0.2 %    Comment: Performed at [facility], [address] [place], [place] [address]  CBC     Status: Abnormal   Collection Time: 01/06/24  4:30 AM  Result Value Ref Range   WBC 9.9 4.0 - 10.5 K/uL   RBC 3.19 (L) 3.87 - 5.11 MIL/uL   Hemoglobin 8.0 (L) 12.0 - 15.0 g/dL   HCT 74.0 (L) 63.9 - 53.9 %   MCV 81.2 80.0 - 100.0 fL   MCH 25.1 (L) 26.0 - 34.0 pg   MCHC 30.9 30.0 - 36.0 g/dL   RDW 77.2 (H) 88.4 - 84.4 %   Platelets 298 150 - 400  K/uL   nRBC 0.0 0.0 - 0.2 %    Comment: Performed at [facility], [address] [place], [place] [address]  Glucose, 1 hour gestational     Status: None   Collection Time: 01/06/24  8:59 AM  Result Value Ref Range   Glucose Tolerance, 1 hour 187 mg/dL    Comment: Performed at [facility], [address], [place], [place] [address]  CBC     Status: Abnormal   Collection Time: 01/07/24  5:47 AM  Result Value Ref Range   WBC 9.7 4.0 - 10.5 K/uL   RBC 3.18 (L) 3.87 - 5.11 MIL/uL   Hemoglobin 8.0 (L) 12.0 - 15.0 g/dL   HCT 73.9 (L)  63.9 - 46.0 %   MCV 81.8 80.0 - 100.0 fL   MCH 25.2 (L) 26.0 - 34.0 pg   MCHC 30.8 30.0 - 36.0 g/dL   RDW 76.9 (H) 88.4 - 84.4 %   Platelets 303 150 - 400 K/uL   nRBC 0.0 0.0 - 0.2 %    Comment: Performed at [facility], [address] [place], [place] [address]    Assessment and Plan [redacted]w[redacted]d Chronic abruption Pt is stable Continue routine care No active bleeding Infant is vertex.  Pt would like to VBAC Three hour GTT in progress    Patient ID: Kristen Padilla, female   DOB: 1990/11/24, 33 y.o.   MRN: [id number]

## 2024-01-08 LAB — CBC
HCT: 27.8 % — ABNORMAL LOW (ref 36.0–46.0)
Hemoglobin: 8.8 g/dL — ABNORMAL LOW (ref 12.0–15.0)
MCH: 25.4 pg — ABNORMAL LOW (ref 26.0–34.0)
MCHC: 31.7 g/dL (ref 30.0–36.0)
MCV: 80.1 fL (ref 80.0–100.0)
Platelets: 332 K/uL (ref 150–400)
RBC: 3.47 MIL/uL — ABNORMAL LOW (ref 3.87–5.11)
RDW: 22.8 % — ABNORMAL HIGH (ref 11.5–15.5)
WBC: 10.8 K/uL — ABNORMAL HIGH (ref 4.0–10.5)
nRBC: 0 % (ref 0.0–0.2)

## 2024-01-08 MED ORDER — LACTATED RINGERS IV SOLN: INTRAVENOUS | Status: DC

## 2024-01-08 MED ORDER — ZOLPIDEM TARTRATE 5 MG PO TABS
5.0000 mg | ORAL_TABLET | Freq: Every evening | ORAL | Status: DC | PRN
  Filled 2024-01-08: qty 1

## 2024-01-08 NOTE — Progress Notes (Signed)
 0700-1855- 93 ml of VB noted throughout the shift. No clots noted. VB seems to be heavier when she is up to void. Patient states that she has felt contractions rating 6/10, (face scale 2-4) most of the day, irregular contractions and UI noted throughout the day.

## 2024-01-08 NOTE — Progress Notes (Addendum)
 LOS: 4   Subjective:  Minimal, mild cramping during the night and perceives pain in left lower quadrant. Bleeding slightly heavier then the night before. Slight active trickle currently only measuring of blood, new pas placed. Does not perceive regular or constant contractions. Reports active FM and denies LOF. Discussed in length POC and what we are watching for, pt feels strongly if her bleeding get much worse or has another heavy bout of bleeding will desires to go for a RCS. Pt endorses being very awar eof the risk to the baby but worried about needing to run back for a STAT cs and doesn't want that to happen. Otherwise pt endorses she does not want to be in the hospital until 32-34 weeks but is ok with this if needed.   Objective:   No intake or output data in the 24 hours ending 01/08/24 0907  QBL from 7/7-7/8  634 mL,   Physical Exam: VS: Blood pressure 108/63, pulse 85, temperature 98.1 F (36.7 C), temperature source Oral, resp. rate 16, height 5' 2 (1.575 m), weight 83.8 kg, last menstrual period 07/03/2023, SpO2 99%, not currently breastfeeding. AAO x3, no signs of distress, pt mucous membranes are pale.  Cardiovascular: no edema Respiratory: Unlabored GU/GI: Abdomen gravid, non-tender, non-distended, active FM Extremities:negative for pain, tenderness, and cords  Cervical exam:Dilation: 1 Effacement (%): Thick Exam by:: Steffan Rover, MD FHR: baseline rate 140 / variability moderate / accelerations 10x10 / absent decelerations, reactive for gestational age TOCO: none tracing   Labs:   Recent Labs    01/06/24 0430 01/07/24 0547  WBC 9.9 9.7  HGB 8.0* 8.0*  HCT 25.9* 26.0*  PLT 298 303    Assessment / Plan: 33 y.o. AGP@ [redacted]w[redacted]d for chronic abruption, s/p 2nd admission, first admitted on 6/26 @ 24.1 weeks, left AMA on 7/4 r/t home issues but had reported no bleeding for 4 days at that point, pt then readmitting for new bleeding on 7/6 @ 25.4  Chronic  abruption    -QBL since readmission 634 mL    - Hgb 8.2 @ 2229 on 7/7, 7/9 8.0    -magnesium  sulfate off at 0700 7/8 mag level 5.1    -If heavy bleed will proceed with RCS per pt request Rh neg    -O neg, [facility], fetus confirmed RH positive, per Dr. Margaretta note, no Rhogam should be given for 12 weeks post Rhogam on 12/04/23  Fetal status    -NST appropriate for gestational age    -NST Q shift    -BMZ complete on 12/26/23    -U/S 01/05/24 cephalic/anterior placenta/ AFI 12.9    -NICU consult completed on last admission IDA     -S/P IV Venofer  12/25/23. S/P one unit of PRBCs on 6/29    -now on oral iron  Psych/Social hx    -Suicide attempt via overdose in 2021 (Tylenol  and NyQuil per ED note) and 2024 (Bupropion )     -No current meds    -Plan close postpartum F/U  Plan reviewed with DrRONITA Rummer, with orders placed for repeat CBC now and re-consult with MFM based off of conversation with pt.    Kristen Padilla , CNM 01/08/2024, 9:07 AM  2011 Hgb 8.8 this morning.  MFM consult requested this morning because pt desiring moving toward delivery d/t persistent spotting/light bleeding and I would like MFMs input.  Awaiting MFM consult.  NST q shift and cat 1.

## 2024-01-08 NOTE — Progress Notes (Signed)
 Dr. Joane Fetters made aware of newly added MFM consult

## 2024-01-09 ENCOUNTER — Inpatient Hospital Stay (HOSPITAL_COMMUNITY): Admitting: Anesthesiology

## 2024-01-09 ENCOUNTER — Encounter (HOSPITAL_COMMUNITY)
Admission: AD | Disposition: A | Discharge status: Home / Self Care | Payer: Self-pay | Source: Home / Self Care | Attending: Obstetrics and Gynecology

## 2024-01-09 ENCOUNTER — Encounter (HOSPITAL_COMMUNITY): Payer: Self-pay | Admitting: Obstetrics and Gynecology

## 2024-01-09 ENCOUNTER — Encounter (HOSPITAL_COMMUNITY): Payer: Self-pay

## 2024-01-09 DIAGNOSIS — Z3A26 26 weeks gestation of pregnancy: Secondary | ICD-10-CM

## 2024-01-09 DIAGNOSIS — O34211 Maternal care for low transverse scar from previous cesarean delivery: Secondary | ICD-10-CM

## 2024-01-09 DIAGNOSIS — O4592 Premature separation of placenta, unspecified, second trimester: Principal | ICD-10-CM

## 2024-01-09 LAB — COMPREHENSIVE METABOLIC PANEL WITH GFR
ALT: 10 U/L (ref 0–44)
AST: 18 U/L (ref 15–41)
Albumin: 2.6 g/dL — ABNORMAL LOW (ref 3.5–5.0)
Alkaline Phosphatase: 54 U/L (ref 38–126)
Anion gap: 10 (ref 5–15)
BUN: <5 mg/dL — ABNORMAL LOW (ref 6–20)
CO2: 19 mmol/L — ABNORMAL LOW (ref 22–32)
Calcium: 7.6 mg/dL — ABNORMAL LOW (ref 8.9–10.3)
Chloride: 105 mmol/L (ref 98–111)
Creatinine, Ser: 0.59 mg/dL (ref 0.44–1.00)
GFR, Estimated: >60 mL/min (ref >60)
Glucose, Bld: 156 mg/dL — ABNORMAL HIGH (ref 70–99)
Potassium: 4.4 mmol/L (ref 3.5–5.1)
Sodium: 134 mmol/L — ABNORMAL LOW (ref 135–145)
Total Bilirubin: 1.9 mg/dL — ABNORMAL HIGH (ref 0.0–1.2)
Total Protein: 6.6 g/dL (ref 6.5–8.1)

## 2024-01-09 LAB — CBC
HCT: 26.3 % — ABNORMAL LOW (ref 36.0–46.0)
HCT: 25.8 % — ABNORMAL LOW (ref 36.0–46.0)
HCT: 34.7 % — ABNORMAL LOW (ref 36.0–46.0)
Hemoglobin: 8.4 g/dL — ABNORMAL LOW (ref 12.0–15.0)
Hemoglobin: 8.1 g/dL — ABNORMAL LOW (ref 12.0–15.0)
Hemoglobin: 11.1 g/dL — ABNORMAL LOW (ref 12.0–15.0)
MCH: 25.5 pg — ABNORMAL LOW (ref 26.0–34.0)
MCH: 25.5 pg — ABNORMAL LOW (ref 26.0–34.0)
MCH: 26 pg (ref 26.0–34.0)
MCHC: 31.9 g/dL (ref 30.0–36.0)
MCHC: 31.4 g/dL (ref 30.0–36.0)
MCHC: 32 g/dL (ref 30.0–36.0)
MCV: 79.9 fL — ABNORMAL LOW (ref 80.0–100.0)
MCV: 81.1 fL (ref 80.0–100.0)
MCV: 81.3 fL (ref 80.0–100.0)
Platelets: 353 K/uL (ref 150–400)
Platelets: 336 K/uL (ref 150–400)
Platelets: 350 K/uL (ref 150–400)
RBC: 3.29 MIL/uL — ABNORMAL LOW (ref 3.87–5.11)
RBC: 3.18 MIL/uL — ABNORMAL LOW (ref 3.87–5.11)
RBC: 4.27 MIL/uL (ref 3.87–5.11)
RDW: 22.8 % — ABNORMAL HIGH (ref 11.5–15.5)
RDW: 22.9 % — ABNORMAL HIGH (ref 11.5–15.5)
RDW: 21.4 % — ABNORMAL HIGH (ref 11.5–15.5)
WBC: 10.6 K/uL — ABNORMAL HIGH (ref 4.0–10.5)
WBC: 10.6 K/uL — ABNORMAL HIGH (ref 4.0–10.5)
WBC: 12.7 K/uL — ABNORMAL HIGH (ref 4.0–10.5)
nRBC: 0 % (ref 0.0–0.2)
nRBC: 0 % (ref 0.0–0.2)
nRBC: 0 % (ref 0.0–0.2)

## 2024-01-09 LAB — PREPARE RBC (CROSSMATCH)

## 2024-01-09 SURGERY — Surgical Case
Anesthesia: Spinal

## 2024-01-09 MED ORDER — FENTANYL CITRATE (PF) 250 MCG/5ML IJ SOLN
INTRAMUSCULAR | Status: AC
  Filled 2024-01-09: qty 5

## 2024-01-09 MED ORDER — BUPIVACAINE IN DEXTROSE 0.75-8.25 % IT SOLN
INTRATHECAL | Status: DC | PRN
  Administered 2024-01-09: 1.6 mL via INTRATHECAL

## 2024-01-09 MED ORDER — OXYTOCIN-SODIUM CHLORIDE 30-0.9 UT/500ML-% IV SOLN
INTRAVENOUS | Status: DC | PRN
  Administered 2024-01-09: 300 mL via INTRAVENOUS

## 2024-01-09 MED ORDER — ONDANSETRON HCL 4 MG/2ML IJ SOLN
4.0000 mg | INTRAMUSCULAR | Status: DC | PRN
Start: 2024-01-09 — End: 2024-01-13
  Administered 2024-01-09 – 2024-01-10 (×2): 4 mg via INTRAVENOUS
  Filled 2024-01-09 (×2): qty 2

## 2024-01-09 MED ORDER — MORPHINE SULFATE (PF) 0.5 MG/ML IJ SOLN
INTRAMUSCULAR | Status: AC
Start: 2024-01-09 — End: 2024-01-09
  Filled 2024-01-09: qty 10

## 2024-01-09 MED ORDER — ONDANSETRON HCL 4 MG PO TABS: 4.0000 mg | ORAL_TABLET | ORAL | Status: DC | PRN

## 2024-01-09 MED ORDER — BETAMETHASONE SOD PHOS & ACET 6 (3-3) MG/ML IJ SUSP
12.0000 mg | Freq: Once | INTRAMUSCULAR | Status: AC
  Administered 2024-01-09: 12 mg via INTRAMUSCULAR
  Filled 2024-01-09: qty 5

## 2024-01-09 MED ORDER — OXYCODONE HCL 5 MG PO TABS
5.0000 mg | ORAL_TABLET | ORAL | Status: DC | PRN
  Administered 2024-01-10 – 2024-01-12 (×8): 5 mg via ORAL
  Filled 2024-01-09 (×8): qty 1

## 2024-01-09 MED ORDER — OXYCODONE HCL 5 MG PO TABS: 5.0000 mg | ORAL_TABLET | Freq: Once | ORAL | Status: DC | PRN

## 2024-01-09 MED ORDER — METHYLERGONOVINE MALEATE 0.2 MG/ML IJ SOLN
INTRAMUSCULAR | Status: DC | PRN
  Administered 2024-01-09: .2 mg via INTRAMUSCULAR

## 2024-01-09 MED ORDER — DIPHENHYDRAMINE HCL 25 MG PO CAPS: 25.0000 mg | ORAL_CAPSULE | Freq: Four times a day (QID) | ORAL | Status: DC | PRN

## 2024-01-09 MED ORDER — SENNOSIDES-DOCUSATE SODIUM 8.6-50 MG PO TABS: 2.0000 | ORAL_TABLET | Freq: Every day | ORAL | Status: DC

## 2024-01-09 MED ORDER — METHYLERGONOVINE MALEATE 0.2 MG/ML IJ SOLN
INTRAMUSCULAR | Status: AC
Start: 2024-01-09 — End: 2024-01-09
  Filled 2024-01-09: qty 1

## 2024-01-09 MED ORDER — SODIUM CHLORIDE 0.9 % IR SOLN
Status: DC | PRN
  Administered 2024-01-09: 1

## 2024-01-09 MED ORDER — STERILE WATER FOR IRRIGATION IR SOLN
Status: DC | PRN
  Administered 2024-01-09: 1

## 2024-01-09 MED ORDER — OXYTOCIN-SODIUM CHLORIDE 30-0.9 UT/500ML-% IV SOLN: 2.5000 [IU]/h | INTRAVENOUS | Status: AC

## 2024-01-09 MED ORDER — COCONUT OIL OIL: 1.0000 | TOPICAL_OIL | Status: DC | PRN

## 2024-01-09 MED ORDER — SODIUM CHLORIDE 0.9% IV SOLUTION: Freq: Once | INTRAVENOUS | Status: DC

## 2024-01-09 MED ORDER — DEXAMETHASONE SODIUM PHOSPHATE 4 MG/ML IJ SOLN
INTRAMUSCULAR | Status: AC
  Filled 2024-01-09: qty 2

## 2024-01-09 MED ORDER — SODIUM CHLORIDE 0.9% FLUSH: 3.0000 mL | INTRAVENOUS | Status: DC | PRN

## 2024-01-09 MED ORDER — ACETAMINOPHEN 10 MG/ML IV SOLN
INTRAVENOUS | Status: DC | PRN
Start: 2024-01-09 — End: 2024-01-09
  Administered 2024-01-09: 1000 mg via INTRAVENOUS

## 2024-01-09 MED ORDER — ACETAMINOPHEN 500 MG PO TABS
1000.0000 mg | ORAL_TABLET | Freq: Four times a day (QID) | ORAL | Status: DC
  Administered 2024-01-10 – 2024-01-12 (×11): 1000 mg via ORAL
  Filled 2024-01-09 (×11): qty 2

## 2024-01-09 MED ORDER — ACETAMINOPHEN 10 MG/ML IV SOLN
INTRAVENOUS | Status: AC
  Filled 2024-01-09: qty 100

## 2024-01-09 MED ORDER — LACTATED RINGERS IV SOLN: INTRAVENOUS | Status: DC

## 2024-01-09 MED ORDER — SIMETHICONE 80 MG PO CHEW: 80.0000 mg | CHEWABLE_TABLET | ORAL | Status: DC | PRN

## 2024-01-09 MED ORDER — METOCLOPRAMIDE HCL 5 MG/ML IJ SOLN
INTRAMUSCULAR | Status: AC
  Filled 2024-01-09: qty 2

## 2024-01-09 MED ORDER — SODIUM CHLORIDE 0.9 % IV SOLN
500.0000 mg | INTRAVENOUS | Status: DC
  Filled 2024-01-09: qty 5

## 2024-01-09 MED ORDER — TRANEXAMIC ACID-NACL 1000-0.7 MG/100ML-% IV SOLN
INTRAVENOUS | Status: AC
  Filled 2024-01-09: qty 100

## 2024-01-09 MED ORDER — DIBUCAINE (PERIANAL) 1 % EX OINT
1.0000 | TOPICAL_OINTMENT | CUTANEOUS | Status: DC | PRN
Start: 2024-01-09 — End: 2024-01-09

## 2024-01-09 MED ORDER — MAGNESIUM SULFATE BOLUS VIA INFUSION
4.0000 g | Freq: Once | INTRAVENOUS | Status: AC
  Administered 2024-01-09: 4 g via INTRAVENOUS
  Filled 2024-01-09: qty 1000

## 2024-01-09 MED ORDER — DIPHENHYDRAMINE HCL 25 MG PO CAPS: 25.0000 mg | ORAL_CAPSULE | ORAL | Status: DC | PRN

## 2024-01-09 MED ORDER — MORPHINE SULFATE (PF) 0.5 MG/ML IJ SOLN
INTRAMUSCULAR | Status: DC | PRN
  Administered 2024-01-09: 150 ug via INTRATHECAL

## 2024-01-09 MED ORDER — PHENYLEPHRINE HCL-NACL 20-0.9 MG/250ML-% IV SOLN
INTRAVENOUS | Status: AC
  Filled 2024-01-09: qty 250

## 2024-01-09 MED ORDER — OXYTOCIN-SODIUM CHLORIDE 30-0.9 UT/500ML-% IV SOLN
INTRAVENOUS | Status: AC
  Filled 2024-01-09: qty 500

## 2024-01-09 MED ORDER — SENNOSIDES-DOCUSATE SODIUM 8.6-50 MG PO TABS
2.0000 | ORAL_TABLET | Freq: Every day | ORAL | Status: DC
  Filled 2024-01-09: qty 2

## 2024-01-09 MED ORDER — WITCH HAZEL-GLYCERIN EX PADS: 1.0000 | MEDICATED_PAD | CUTANEOUS | Status: DC | PRN

## 2024-01-09 MED ORDER — OXYCODONE HCL 5 MG/5ML PO SOLN: 5.0000 mg | Freq: Once | ORAL | Status: DC | PRN

## 2024-01-09 MED ORDER — KETOROLAC TROMETHAMINE 30 MG/ML IJ SOLN
30.0000 mg | Freq: Four times a day (QID) | INTRAMUSCULAR | Status: AC
  Administered 2024-01-10 (×3): 30 mg via INTRAVENOUS
  Filled 2024-01-09 (×3): qty 1

## 2024-01-09 MED ORDER — DEXAMETHASONE SODIUM PHOSPHATE 10 MG/ML IJ SOLN
INTRAMUSCULAR | Status: DC | PRN
  Administered 2024-01-09: 10 mg via INTRAVENOUS

## 2024-01-09 MED ORDER — TRANEXAMIC ACID-NACL 1000-0.7 MG/100ML-% IV SOLN: 1000.0000 mg | Freq: Once | INTRAVENOUS | Status: DC

## 2024-01-09 MED ORDER — IBUPROFEN 600 MG PO TABS: 600.0000 mg | ORAL_TABLET | Freq: Four times a day (QID) | ORAL | Status: DC

## 2024-01-09 MED ORDER — RHO D IMMUNE GLOBULIN 1500 UNIT/2ML IJ SOSY
300.0000 ug | PREFILLED_SYRINGE | Freq: Once | INTRAMUSCULAR | Status: AC
  Administered 2024-01-09: 300 ug via INTRAVENOUS
  Filled 2024-01-09: qty 2

## 2024-01-09 MED ORDER — ACETAMINOPHEN 325 MG PO TABS
650.0000 mg | ORAL_TABLET | Freq: Once | ORAL | Status: AC
  Administered 2024-01-09: 650 mg via ORAL
  Filled 2024-01-09: qty 2

## 2024-01-09 MED ORDER — HYDROMORPHONE HCL 1 MG/ML IJ SOLN: 0.2500 mg | INTRAMUSCULAR | Status: DC | PRN

## 2024-01-09 MED ORDER — SOD CITRATE-CITRIC ACID 500-334 MG/5ML PO SOLN: 30.0000 mL | ORAL | Status: DC

## 2024-01-09 MED ORDER — SODIUM CHLORIDE 0.9 % IV SOLN: 12.5000 mg | INTRAVENOUS | Status: DC | PRN

## 2024-01-09 MED ORDER — PHENYLEPHRINE 80 MCG/ML (10ML) SYRINGE FOR IV PUSH (FOR BLOOD PRESSURE SUPPORT)
PREFILLED_SYRINGE | INTRAVENOUS | Status: DC | PRN
  Administered 2024-01-09 (×3): 80 ug via INTRAVENOUS

## 2024-01-09 MED ORDER — BENZOCAINE-MENTHOL 20-0.5 % EX AERO
1.0000 | INHALATION_SPRAY | CUTANEOUS | Status: DC | PRN
Start: 2024-01-09 — End: 2024-01-09

## 2024-01-09 MED ORDER — MAGNESIUM SULFATE 40 GM/1000ML IV SOLN
2.0000 g/h | INTRAVENOUS | Status: DC
  Filled 2024-01-09: qty 1000

## 2024-01-09 MED ORDER — TETANUS-DIPHTH-ACELL PERTUSSIS 5-2.5-18.5 LF-MCG/0.5 IM SUSY: 0.5000 mL | PREFILLED_SYRINGE | Freq: Once | INTRAMUSCULAR | Status: DC

## 2024-01-09 MED ORDER — PHENYLEPHRINE HCL-NACL 20-0.9 MG/250ML-% IV SOLN
INTRAVENOUS | Status: DC | PRN
  Administered 2024-01-09: 60 ug/min via INTRAVENOUS

## 2024-01-09 MED ORDER — ZOLPIDEM TARTRATE 5 MG PO TABS: 5.0000 mg | ORAL_TABLET | Freq: Every evening | ORAL | Status: DC | PRN

## 2024-01-09 MED ORDER — NALOXONE HCL 0.4 MG/ML IJ SOLN: 0.4000 mg | INTRAMUSCULAR | Status: DC | PRN

## 2024-01-09 MED ORDER — CEFAZOLIN SODIUM-DEXTROSE 2-4 GM/100ML-% IV SOLN
2.0000 g | INTRAVENOUS | Status: AC
  Administered 2024-01-09: 2 g via INTRAVENOUS

## 2024-01-09 MED ORDER — MENTHOL 3 MG MT LOZG: 1.0000 | LOZENGE | OROMUCOSAL | Status: DC | PRN

## 2024-01-09 MED ORDER — PRENATAL MULTIVITAMIN CH: 1.0000 | ORAL_TABLET | Freq: Every day | ORAL | Status: DC

## 2024-01-09 MED ORDER — FENTANYL CITRATE (PF) 100 MCG/2ML IJ SOLN
INTRAMUSCULAR | Status: DC | PRN
  Administered 2024-01-09: 15 ug via INTRATHECAL

## 2024-01-09 MED ORDER — KETOROLAC TROMETHAMINE 30 MG/ML IJ SOLN
30.0000 mg | Freq: Once | INTRAMUSCULAR | Status: AC
  Administered 2024-01-09: 30 mg via INTRAVENOUS

## 2024-01-09 MED ORDER — LACTATED RINGERS IV SOLN: INTRAVENOUS | Status: AC

## 2024-01-09 MED ORDER — ACETAMINOPHEN 500 MG PO TABS: 1000.0000 mg | ORAL_TABLET | Freq: Four times a day (QID) | ORAL | Status: DC | PRN

## 2024-01-09 MED ORDER — DIBUCAINE (PERIANAL) 1 % EX OINT: 1.0000 | TOPICAL_OINTMENT | CUTANEOUS | Status: DC | PRN

## 2024-01-09 MED ORDER — TRANEXAMIC ACID-NACL 1000-0.7 MG/100ML-% IV SOLN
INTRAVENOUS | Status: DC | PRN
  Administered 2024-01-09: 1000 mg via INTRAVENOUS

## 2024-01-09 MED ORDER — KETOROLAC TROMETHAMINE 30 MG/ML IJ SOLN
INTRAMUSCULAR | Status: AC
  Filled 2024-01-09: qty 1

## 2024-01-09 MED ORDER — ONDANSETRON HCL 4 MG/2ML IJ SOLN
INTRAMUSCULAR | Status: DC | PRN
  Administered 2024-01-09: 4 mg via INTRAVENOUS

## 2024-01-09 MED ORDER — PHENYLEPHRINE 80 MCG/ML (10ML) SYRINGE FOR IV PUSH (FOR BLOOD PRESSURE SUPPORT)
PREFILLED_SYRINGE | INTRAVENOUS | Status: AC
  Filled 2024-01-09: qty 10

## 2024-01-09 MED ORDER — NALOXONE HCL 4 MG/10ML IJ SOLN: 1.0000 ug/kg/h | INTRAVENOUS | Status: DC | PRN

## 2024-01-09 MED ORDER — ONDANSETRON HCL 4 MG/2ML IJ SOLN
INTRAMUSCULAR | Status: AC
  Filled 2024-01-09: qty 2

## 2024-01-09 MED ORDER — MEPERIDINE HCL 25 MG/ML IJ SOLN: 6.2500 mg | INTRAMUSCULAR | Status: DC | PRN

## 2024-01-09 MED ORDER — DIPHENHYDRAMINE HCL 50 MG/ML IJ SOLN
12.5000 mg | INTRAMUSCULAR | Status: DC | PRN
  Administered 2024-01-09: 12.5 mg via INTRAVENOUS
  Filled 2024-01-09: qty 1

## 2024-01-09 MED ORDER — SIMETHICONE 80 MG PO CHEW
80.0000 mg | CHEWABLE_TABLET | Freq: Three times a day (TID) | ORAL | Status: DC
  Administered 2024-01-10 (×2): 80 mg via ORAL
  Filled 2024-01-09 (×5): qty 1

## 2024-01-09 MED ORDER — SOD CITRATE-CITRIC ACID 500-334 MG/5ML PO SOLN
ORAL | Status: AC
  Administered 2024-01-09: 30 mL
  Filled 2024-01-09: qty 30

## 2024-01-09 MED ORDER — OXYCODONE HCL 5 MG PO TABS: 5.0000 mg | ORAL_TABLET | ORAL | Status: DC | PRN

## 2024-01-09 SURGICAL SUPPLY — 33 items
BENZOIN TINCTURE PRP APPL 2/3 (GAUZE/BANDAGES/DRESSINGS) ×1 IMPLANT
CHLORAPREP W/TINT 26 (MISCELLANEOUS) ×2 IMPLANT
CLAMP UMBILICAL CORD (MISCELLANEOUS) ×1 IMPLANT
CLOTH BEACON ORANGE TIMEOUT ST (SAFETY) ×1 IMPLANT
DERMABOND ADVANCED .7 DNX12 (GAUZE/BANDAGES/DRESSINGS) IMPLANT
DRSG OPSITE POSTOP 4X10 (GAUZE/BANDAGES/DRESSINGS) ×1 IMPLANT
ELECTRODE REM PT RTRN 9FT ADLT (ELECTROSURGICAL) ×1 IMPLANT
EXTRACTOR VACUUM M CUP 4 TUBE (SUCTIONS) IMPLANT
GLOVE BIO SURGEON STRL SZ7.5 (GLOVE) ×1 IMPLANT
GLOVE BIOGEL PI IND STRL 7.0 (GLOVE) ×1 IMPLANT
GLOVE BIOGEL PI IND STRL 7.5 (GLOVE) ×1 IMPLANT
GOWN STRL REUS W/TWL LRG LVL3 (GOWN DISPOSABLE) ×2 IMPLANT
KIT ABG SYR 3ML LUER SLIP (SYRINGE) IMPLANT
MAT PREVALON FULL STRYKER (MISCELLANEOUS) IMPLANT
NDL HYPO 25X5/8 SAFETYGLIDE (NEEDLE) IMPLANT
NEEDLE HYPO 25X5/8 SAFETYGLIDE (NEEDLE) IMPLANT
NS IRRIG 1000ML POUR BTL (IV SOLUTION) ×1 IMPLANT
PACK C SECTION WH (CUSTOM PROCEDURE TRAY) ×1 IMPLANT
PAD OB MATERNITY 4.3X12.25 (PERSONAL CARE ITEMS) ×1 IMPLANT
RETAINER VISCERAL (MISCELLANEOUS) IMPLANT
RTRCTR C-SECT PINK 25CM LRG (MISCELLANEOUS) ×1 IMPLANT
STRIP CLOSURE SKIN 1/2X4 (GAUZE/BANDAGES/DRESSINGS) ×1 IMPLANT
SUT CHROMIC 2 0 CT 1 (SUTURE) ×1 IMPLANT
SUT MNCRL 0 VIOLET CTX 36 (SUTURE) ×1 IMPLANT
SUT MNCRL AB 3-0 PS2 27 (SUTURE) ×1 IMPLANT
SUT PLAIN 2 0 XLH (SUTURE) ×1 IMPLANT
SUT VIC AB 0 CT1 36 (SUTURE) ×1 IMPLANT
SUT VIC AB 0 CTX36XBRD ANBCTRL (SUTURE) ×3 IMPLANT
SUT VIC AB 2-0 SH 27XBRD (SUTURE) IMPLANT
SUT VIC AB 4-0 KS 27 (SUTURE) IMPLANT
TOWEL OR 17X24 6PK STRL BLUE (TOWEL DISPOSABLE) ×1 IMPLANT
TRAY FOLEY W/BAG SLVR 14FR LF (SET/KITS/TRAYS/PACK) ×1 IMPLANT
WATER STERILE IRR 1000ML POUR (IV SOLUTION) ×1 IMPLANT

## 2024-01-09 NOTE — Anesthesia Postprocedure Evaluation (Signed)
 Anesthesia Post Note  Patient: Kristen Padilla  Procedure(s) Performed: CESAREAN DELIVERY     Patient location during evaluation: PACU Anesthesia Type: Spinal Level of consciousness: awake and alert Pain management: pain level controlled Vital Signs Assessment: post-procedure vital signs reviewed and stable Respiratory status: spontaneous breathing, nonlabored ventilation and respiratory function stable Cardiovascular status: blood pressure returned to baseline and stable Postop Assessment: no apparent nausea or vomiting Anesthetic complications: no   No notable events documented.  Last Vitals:  Vitals:   01/09/24 1646 01/09/24 1700  BP: 109/71 (!) 96/59  Pulse: 73 79  Resp: 16 18  Temp: 36.5 C   SpO2: 91% 93%    Last Pain:  Vitals:   01/09/24 1726  TempSrc:   PainSc: 5                  Butler Levander Pinal

## 2024-01-09 NOTE — Progress Notes (Signed)
 Patient ID: Kristen Padilla, female   DOB: 1991-03-27, 33 y.o.   MRN: [id number] Kristen Padilla is a 33 y.o. [id number] at [redacted]w[redacted]d admitted for third trimester vaginal bleeding   Subjective: Just changed pap but has had intermittent continued bleeding since last night.  She has decided not to proceed with sterilization procedure and continues to want to proceed with delivery.  Objective: BP 109/70   Pulse 93   Temp 98.1 F (36.7 C) (Oral)   Resp 16   Ht 5' 2 (1.575 m)   Wt 83.8 kg   LMP 07/03/2023 (Approximate)   SpO2 97%   BMI 33.78 kg/m  I/O last 3 completed shifts: In: 1884 [I.V.:1884] Out: 810 [Blood:810] Total I/O In: 789.9 [Blood:789.9] Out: 750 [Urine:750]  Physical Exam:  Gen: alert, no distress Chest/Lungs: unlabored Heart/Pulse: RRR  Abdomen: soft, gravid, nontender, BX x4 quad Uterine fundus: soft, nontender Skin & Color: warm and dry  Neurological: AOx3, DTRs 1+ EXT: negative Homan's b/l, edema negative  FHT:  FHR: 130 bpm, variability: moderate,  accelerations:  Present,  decelerations:  Absent UC:   irritability SVE:   Dilation: 1 Effacement (%): Thick Exam by:: Steffan Rover, MD  Bedside ultrasound today by me - cephalic  Labs: Lab Results  Component Value Date   WBC 10.6 (H) 01/09/2024   HGB 8.1 (L) 01/09/2024   HCT 25.8 (L) 01/09/2024   MCV 81.1 01/09/2024   PLT 336 01/09/2024    Assessment and Plan: has Depression; GAD (generalized anxiety disorder); PTSD (post-traumatic stress disorder); Rh negative, antepartum; Vaginal bleeding in pregnancy, second trimester; Overdose, intentional self-harm, initial encounter [facility]); MDD (major depressive disorder); Adjustment disorder with depressed mood; Obesity affecting pregnancy, antepartum; History of prior pregnancy with short cervix, currently pregnant; History of sleeve gastrectomy; History of multiple spontaneous abortions; History of shoulder dystocia in prior pregnancy,  currently pregnant; History of preterm delivery, currently pregnant; History of placenta abruption; [redacted] weeks gestation of pregnancy; and Previous cesarean delivery, antepartum condition or complication on their problem list.  Proceeding with repeat c-section and will not do sterilization procedure per pt request.   She is ready to proceed.  Risks benefits and alternatives reviewed with the patient including but not limited to bleeding infection injury and need for additional blood transfusions.  Pt has discussed at length the risks benefits and alternatives with MFM and has discussed prognosis for the baby with the neonatologist.  Please see Dr. Marcus and Dr. Enedina notes and Dr. Marcus recommendation to proceed with delivery.  Questions answered and consent signed and witnessed.  Kristen Padilla 01/09/2024, 2:49 PM

## 2024-01-09 NOTE — Anesthesia Procedure Notes (Signed)
 Spinal  Patient location during procedure: OB Start time: 01/09/2024 3:11 PM End time: 01/09/2024 3:16 PM Reason for block: surgical anesthesia Staffing Performed: anesthesiologist  Anesthesiologist: Cleotilde Butler Dade, MD Performed by: Cleotilde Butler Dade, MD Authorized by: Cleotilde Butler Dade, MD   Preanesthetic Checklist Completed: patient identified, IV checked, risks and benefits discussed, surgical consent, monitors and equipment checked, pre-op evaluation and timeout performed Spinal Block Patient position: sitting Prep: DuraPrep and site prepped and draped Patient monitoring: heart rate, cardiac monitor, continuous pulse ox and blood pressure Approach: midline Location: L3-4 Injection technique: single-shot Needle Needle type: Pencan  Needle gauge: 24 G Needle length: 10 cm Assessment Sensory level: T4 Events: CSF return

## 2024-01-09 NOTE — Lactation Note (Signed)
 This note was copied from a baby's chart. Lactation Consultation Note  Patient Name: Kristen Padilla Date: 01/09/2024 Age:32 hours Reason for consult: Initial assessment;NICU baby;Preterm <34wks;Infant < 5lbs  P3- Infant was born at [redacted]w[redacted]d GA weighing [phone] and is admitted to room 323 in the NICU due to infant prematurity. MOB reports that this is her second child that was born premature (31w GA), so she feels like she understands what to do for infant in terms of providing milk. Per MOB, her first child was formula fed only and she exclusively pumped for her second child (NICU baby) for 4 months. MOB explained how she had to stop providing breast milk at 4 months because her child had a severe dairy allergy and was having bloody stool. MOB reports pumping (on average) 18 ounces per pumping session with her last child. She had an extreme oversupply, but no hx of mastitis occurring.  MOB already had the hospital DEBP set up but had not pumped yet. LC offered a hands free pump, but MOB declined and stated that she prefers to hold the pump. The hands free bra was set in her room just in case. LC placed MOB in 18 mm flanges and was provided with milk storage labels printed from the NICU RN. LC pumped with MOB for 10 minutes and she was able to collect 55 mL. LC brought all of the EBM to infant's room. MOB does not have a pump, but qualifies for a STORK so LC sent the referral. MOB plans to exclusively pump for this infant as well. Per MOB, she has no interest in latching infant. LC reviewed the CDC milk storage guidelines, LC services handout and engorgement/breast care. LC encouraged MOB to call for further assistance as needed.  Maternal Data Has patient been taught Hand Expression?: Yes Does the patient have breastfeeding experience prior to this delivery?: Yes How long did the patient breastfeed?: 3 months, stop because infant had dairy allergy and was having bloody  stool  Feeding Mother's Current Feeding Choice: Breast Milk and Donor Milk  Lactation Tools Discussed/Used Tools: Pump;Flanges Flange Size: 18 Breast pump type: Double-Electric Breast Pump;Manual Pump Education: Setup, frequency, and cleaning;Milk Storage Reason for Pumping: LPI/low birth weight infant Pumping frequency: 15-20 min every 3 hrs Pumped volume: 55 mL  Interventions Interventions: Breast feeding basics reviewed;Expressed milk;Hand pump;DEBP;Education;LC Services brochure  Discharge Discharge Education: Engorgement and breast care;Warning signs for feeding baby Pump: Referral sent for [facility] Pump  Consult Status Consult Status: NICU follow-up Date: 01/10/24 Follow-up type: In-patient    Recardo Hoit BS, IBCLC 01/09/2024, 8:58 PM

## 2024-01-09 NOTE — Anesthesia Preprocedure Evaluation (Signed)
 Anesthesia Evaluation  Patient identified by MRN, date of birth, ID band Patient awake    Reviewed: Allergy & Precautions, NPO status , Patient's Chart, lab work & pertinent test results, Unable to perform ROS - Chart review onlyPreop documentation limited or incomplete due to emergent nature of procedure.  Airway Mallampati: II  TM Distance: >3 FB Neck ROM: Full    Dental  (+) Teeth Intact, Dental Advisory Given   Pulmonary neg pulmonary ROS   Pulmonary exam normal breath sounds clear to auscultation       Cardiovascular negative cardio ROS  Rhythm:Regular Rate:Tachycardia     Neuro/Psych  PSYCHIATRIC DISORDERS Anxiety Depression    negative neurological ROS     GI/Hepatic negative GI ROS, Neg liver ROS,,,  Endo/Other  negative endocrine ROS  Obesity   Renal/GU negative Renal ROS     Musculoskeletal negative musculoskeletal ROS (+)    Abdominal   Peds  Hematology negative hematology ROS (+)   Anesthesia Other Findings Day of surgery medications reviewed with the patient.  Reproductive/Obstetrics (+) Pregnancy Suspected placental abruption                              Anesthesia Physical Anesthesia Plan  ASA: 3 and emergent  Anesthesia Plan: Spinal   Post-op Pain Management: Ofirmev  IV (intra-op)*   Induction: Intravenous  PONV Risk Score and Plan: 2 and Treatment may vary due to age or medical condition  Airway Management Planned: Natural Airway  Additional Equipment:   Intra-op Plan:   Post-operative Plan:   Informed Consent: I have reviewed the patients History and Physical, chart, labs and discussed the procedure including the risks, benefits and alternatives for the proposed anesthesia with the patient or authorized representative who has indicated his/her understanding and acceptance.     Only emergency history available and Dental advisory given  Plan Discussed  with: CRNA  Anesthesia Plan Comments:         Anesthesia Quick Evaluation

## 2024-01-09 NOTE — Progress Notes (Signed)
 Dr. Dwan notified for NICU conseult per Dr. Henry request. Per Dwan, the patient has received NICU consult earlier with this admission, but offered if the patient has additional questions.

## 2024-01-09 NOTE — Anesthesia Preprocedure Evaluation (Signed)
 Anesthesia Evaluation  Patient identified by MRN, date of birth, ID band Patient awake    Reviewed: Allergy & Precautions, H&P , NPO status , Patient's Chart, lab work & pertinent test results, Unable to perform ROS - Chart review onlyPreop documentation limited or incomplete due to emergent nature of procedure.  Airway Mallampati: II  TM Distance: >3 FB Neck ROM: Full    Dental  (+) Teeth Intact, Dental Advisory Given   Pulmonary neg pulmonary ROS   Pulmonary exam normal breath sounds clear to auscultation       Cardiovascular negative cardio ROS  Rhythm:Regular Rate:Tachycardia     Neuro/Psych   Anxiety Depression    negative neurological ROS  negative psych ROS   GI/Hepatic negative GI ROS, Neg liver ROS,,,  Endo/Other  negative endocrine ROS  Obesity   Renal/GU negative Renal ROS  negative genitourinary   Musculoskeletal negative musculoskeletal ROS (+)    Abdominal   Peds negative pediatric ROS (+)  Hematology negative hematology ROS (+)   Anesthesia Other Findings Day of surgery medications reviewed with the patient.  Reproductive/Obstetrics negative OB ROS Suspected placental abruption                              Anesthesia Physical Anesthesia Plan  ASA: 3  Anesthesia Plan: Spinal   Post-op Pain Management: Ofirmev  IV (intra-op)*   Induction: Intravenous  PONV Risk Score and Plan: 2 and Treatment may vary due to age or medical condition  Airway Management Planned: Natural Airway  Additional Equipment:   Intra-op Plan:   Post-operative Plan:   Informed Consent: I have reviewed the patients History and Physical, chart, labs and discussed the procedure including the risks, benefits and alternatives for the proposed anesthesia with the patient or authorized representative who has indicated his/her understanding and acceptance.     Only emergency history available  and Dental advisory given  Plan Discussed with: CRNA  Anesthesia Plan Comments:         Anesthesia Quick Evaluation

## 2024-01-09 NOTE — Progress Notes (Signed)
 In to assess patient due to reports of heavier vaginal bleeding while in the restroom. Upon my arrival patient was in bed. Rapid Response Nurse Tiffany was at the bedside monitoring fetal heart rate. There was scant amount of blood on current pad however previous pads rendered a QBL of 301cc.   Vitals:   01/08/24 2342 01/09/24 0100  BP: (!) 95/55 106/63  Pulse: 83 (!) 124  Resp: 18   Temp: 98.5 F (36.9 C)   SpO2: 98% 99%    General Patient appeared anxious NAD Resp: no labored breathing Abdomen : Soft Gravid nontender to palpation.  Ext no edema noted  Sterile Speculum exam: Scant amount of blood in the vaginal vault. No active bleeding was noted.   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  Episode of heavy bleeding that has resolved. Patient and fetus currently stable.  - NPO for now  -CBC now and repeat in 6 hours  -Bedrest overnight -Fetal Well Being- reassuring NST continue continous monitoring for now.

## 2024-01-09 NOTE — Transfer of Care (Signed)
 Immediate Anesthesia Transfer of Care Note  Patient: Kristen Padilla  Procedure(s) Performed: CESAREAN DELIVERY  Patient Location: PACU  Anesthesia Type:Spinal  Level of Consciousness: awake  Airway & Oxygen Therapy: Patient Spontanous Breathing  Post-op Assessment: Report given to RN  Post vital signs: Reviewed and stable  Last Vitals:  Vitals Value Taken Time  BP    Temp    Pulse    Resp    SpO2      Last Pain:  Vitals:   01/09/24 1301  TempSrc: Oral  PainSc: Asleep         Complications: No notable events documented.

## 2024-01-09 NOTE — Op Note (Addendum)
 Cesarean Section Procedure Note  Indications: 26 2/7 wks with chronic placental  abruption  Pre-operative Diagnosis: 1.26 2/7wks 2.Repeat Cesarean Section 3.Chronic Placental Abruption and 4.History of Cesarean Section   Post-operative Diagnosis: 1.26 2/7wks 2.Repeat Cesarean Section 3.Chronic Placental Abruption and 4.History of Cesarean Section  Procedure: Repeat Low Transverse Cesarean Section  Surgeon: Jon Rummer, MD    Assistants: Mercer Peal, CNM  Anesthesia: Regional  Anesthesiologist: Cleotilde Butler Dade, MD   Procedure Details  The patient was taken to the operating room secondary to chronic placental abruption after the risks, benefits, complications, treatment options, and expected outcomes were discussed with the patient.  The patient concurred with the proposed plan, giving informed consent which was signed and witnessed. The patient was taken to Operating Room B, identified as Kristen Padilla and the procedure verified as C-Section Delivery. A Time Out was held and the above information confirmed.  After induction of anesthesia by obtaining a spinal, the patient was prepped and draped in the usual sterile manner. A Pfannenstiel skin incision was made and carried down through the subcutaneous tissue to the underlying layer of fascia.  The fascia was incised bilaterally and extended transversely bilaterally with the Mayo scissors. Kocher clamps were placed on the inferior aspect of the fascial incision and the underlying rectus muscle was separated from the fascia. The same was done on the superior aspect of the fascial incision.  The peritoneum was identified, entered bluntly and extended manually.  An Alexis self-retaining retractor was placed.  The utero-vesical peritoneal reflection was incised transversely and the bladder flap was bluntly freed from the lower uterine segment. A low transverse uterine incision was made with the scalpel and extended bilaterally  with the bandage scissors.  The infant was delivered in vertex position without difficulty.  After the umbilical cord was clamped and cut, the infant was handed to the awaiting pediatricians.  Cord blood was obtained for evaluation.  The placenta was removed intact and appeared to be within normal limits. The uterus was cleared of all clots and debris. The uterine incision was closed with running interlocking sutures of 0 Vicryl and a second imbricating layer was performed as well.   Bilateral tubes and ovaries appeared to be within normal limits.  Good hemostasis was noted.  Copious irrigation was performed until clear.  The peritoneum was repaired with 2-0 chromic via a running suture.  The fascia was reapproximated with a running suture of 0 Vicryl.  The skin was reapproximated with a subcuticular suture of 3-0 monocryl.  Steristrips were applied.  Instrument, sponge, and needle counts were correct prior to abdominal closure and at the conclusion of the case.  The patient was awaiting transfer to the recovery room in good condition.  Findings: Live female infant with Apgars 7 at one minute and 8 at five minutes.  Normal appearing bilateral ovaries and fallopian tubes were noted.  Three cm area much darker than the remainder of the placenta.  No obvious clot noted but that area was at the leading edge.  On the right lateral aspect of the uterus at the level of the incision, there was a cord-like structure.  It was atypically located and appeared to be above the bladder and coursed the round ligament to the rt lateral aspect of incision.  Estimated Blood Loss:  448 ml         Drains: foley to gravity clear urine 250 cc         Total IV Fluids: 750  ml         Specimens to Pathology: Placenta         Complications:  None; patient tolerated the procedure well.         Disposition: PACU - hemodynamically stable.         Condition: stable  Attending Attestation: I performed the procedure.  I was  present and scrubbed and the assistant was required due to complexity of anatomy.

## 2024-01-09 NOTE — Consult Note (Addendum)
 MFM telemedicine consultation  I connected with  Kristen Padilla on 01/09/24 telephonically and verified that I am speaking with the correct person using two identifiers.   I discussed the limitations of evaluation and management by telemedicine. The patient expressed understanding and agreed to proceed.  Kristen Padilla is a 33 yo G8P2 at 75 w 2 d with an EDD of 04/14/24.    She is seen as a follow up consultation at the request of Dr. Jon Rummer due to ongoing vaginal bleeding in the context of a chronic abruption.  She was seen previously by Dr. Fredia Fresh on 06/26.  Ms Padilla history is complicated by inpatient admission on 6/24 for second trimester vaginal bleeding. She was admitted and left AMA 7/4 due to childcare concerns. She returned on 7/6  with another episode of heavy vaginal bleeding and had remained inpatient. She has has daily bleeding and/or spotting.   Kristen Padilla explained that she is mentally and emotionally drained. She is scared for her life and doesn't want an emergency cesarean delivery. . This morning she had two episodes of bleeding of 300 mL and 100 mL BRB. She had a prior 31 placental abruption.  She explained that she has two you children at home and that she needs to be present for. She expressed her understanding of the risk for decreased survival, cerebral palsy, IVH and necrotizing enterocolitis. She expressed and understanding that 28 weeks would be a more ideal milestone, however, her gut tells her she needs to have a baby and that she is overall not doing well.  She is s/p Magnesium , NICU consultation and BMZ. She is receiving 2 units of PRBC as she has been anemic with a hgb of 8.      01/09/2024    7:50 AM 01/09/2024    7:15 AM 01/09/2024    4:53 AM  Vitals with BMI  Systolic 111 98 108  Diastolic 63 57 64  Pulse 80 77 86      Latest Ref Rng & Units 01/09/2024    5:57 AM 01/09/2024    2:52 AM 01/08/2024    9:51 AM  CBC  WBC 4.0 -  10.5 K/uL 10.6  10.6  10.8   Hemoglobin 12.0 - 15.0 g/dL 8.1  8.4  8.8   Hematocrit 36.0 - 46.0 % 25.8  26.3  27.8   Platelets 150 - 400 K/uL 336  353  332       Latest Ref Rng & Units 12/26/2023    5:31 AM 04/04/2023    6:47 PM 01/14/2023   12:18 PM  CMP  Glucose 70 - 99 mg/dL 890  86    BUN 6 - 20 mg/dL 6  11    Creatinine 9.55 - 1.00 mg/dL 9.49  9.19    Sodium 864 - 145 mmol/L 136  140    Potassium 3.5 - 5.1 mmol/L 3.8  4.0  4.6   Chloride 98 - 111 mmol/L 105  106    CO2 22 - 32 mmol/L 20  25    Calcium  8.9 - 10.3 mg/dL 8.8  9.2    Total Protein 6.5 - 8.1 g/dL 6.5  7.2    Total Bilirubin 0.0 - 1.2 mg/dL 0.4  0.3    Alkaline Phos 38 - 126 U/L 44  61    AST 15 - 41 U/L 16  15    ALT 0 - 44 U/L 11  11     Past Medical History:  Diagnosis Date   Anxiety, generalized    Bupropion  overdose 01/12/2023   Wellbutrin  OD suicide attempt.     Closed fracture of left distal radius 03/29/2020   Added automatically from request for surgery [id number]     Depression    Leukocytosis 05/21/2020   Miscarriage    Morbid obesity (HCC)    Overdose 05/22/2020   Placental abruption in third trimester 06/28/2022   Second trimester bleeding 06/17/2022   Suicidal behavior with attempted self-injury (HCC) 2014   ibuprofen  OD   Suicidal behavior with attempted self-injury (HCC) 2022   tylenol  and NyQuil OD   Tylenol  overdose, intentional self-harm, initial encounter (HCC) 05/21/2020   UTI (urinary tract infection)    Vaginal bleeding in pregnancy, third trimester 06/27/2022   Past Surgical History:  Procedure Laterality Date   CESAREAN SECTION N/A 06/29/2022   Procedure: CESAREAN SECTION;  Surgeon: Gloriann Chick, MD;  Location: [facility] LD ORS;  Service: Obstetrics;  Laterality: N/A;   HERNIA REPAIR     LAPAROSCOPIC GASTRIC SLEEVE RESECTION     Repair of displaced fracture of the left radius  03/2020          Current Facility-Administered Medications (Analgesics):    acetaminophen  (TYLENOL )  tablet 650 mg   Current Facility-Administered Medications (Hematological):    iron  polysaccharides (NIFEREX) capsule 150 mg   Current Facility-Administered Medications (Other):    0.9 %  sodium chloride  infusion (Manually program via Guardrails IV Fluids)   0.9 %  sodium chloride  infusion (Manually program via Guardrails IV Fluids)   0.9 %  sodium chloride  infusion (Manually program via Guardrails IV Fluids)   0.9 %  sodium chloride  infusion (Manually program via Guardrails IV Fluids)   azithromycin  (ZITHROMAX ) 500 mg in sodium chloride  0.9 % 250 mL IVPB   ceFAZolin  (ANCEF ) IVPB 2g/100 mL premix   docusate sodium  (COLACE) capsule 100 mg   lactated ringers  infusion   lactated ringers  infusion   magnesium  bolus via infusion 4 g   magnesium  sulfate 40 grams in SWI 1000 mL OB infusion   prenatal vitamin w/FE, FA (NATACHEW) chewable tablet 1 tablet   sodium citrate -citric acid  (ORACIT) 500-334 MG/5ML solution   sodium citrate -citric acid  (ORACIT) solution 30 mL   zolpidem  (AMBIEN ) tablet 5 mg  No current outpatient medications on file. Allergies  Allergen Reactions   Progesterone  Swelling    Vaginal swelling when taken vaginally   Aripiprazole Rash   Prenatal Vitamins Rash    States she got a rash from taking the prenatal the hospital offers    Sulfa Antibiotics Rash   Impression/Counseling:  I discussed with Kristen Padilla that my recommendation would be to deliver at 28 weeks. Given that overall fetal survival is improved and that neonatal comorbidities are also reduced.   We reviewed the risk for cerebral palsy, IVH, necrotizing enterocolitis, and >90% survival at [redacted] weeks gestation.   We discussed that there is an increased risk for emergency cesarean delivery given the persistent bleeding. We are encouraged that the fetal status has been reassuring throughout this time, however that fetal status could deteriorate quickly with a significant bleed and this is not predicable even if  hospitalized.   I recommend: NPO Restart Magnesium  sulfate for neuro protection NICU consultation Repeat DIC panel   Ms. Torres expressed tearfully that she desired to delivery in the next 24 hours.  All questions answered.  I discussed the plan of care with Dr. Jon Rummer.  I spent 45 minutes with > 50% in  direct communication, medical record review and care coordination.  Nathanel DOROTHA Fetters, MD

## 2024-01-09 NOTE — Progress Notes (Signed)
 Patient with recurrent bleeding after ambulating to the restroom. She was on strict bedrest however did not comply. She has had an additional 300 cc of bleeding over the last 3 hours. Fetal heart rate remains reassuring.  Plan to transfuse 2 units pRBC's due to chronic abruption. Recommend repeat cesarean section. Patient has changed her mind regarding sterilization at the time of cesarean section.  R/B/A of cesarean section discussed with the patient including but not limited to infection, bleeding damage to bowel bladder and baby with the need for further surgery. R/O transfusion HIV/ Hep B&C discussed. Pt voiced understanding and desires to proceed with cesarean section.

## 2024-01-09 NOTE — Consult Note (Addendum)
 Consultation Service: Neonatology   Dr. Henry has asked for consultation on Kristen Padilla regarding the care of a premature infant at [redacted]w[redacted]d. Thank you for inviting us  to see this patient.   Reason for consult:  Explain the possible complications, the prognosis, and the care of a premature infant at 44 and 2/7 weeks.  Chief complaint: 33 y.o. female with a singleton female IUP with an estimated weight of 798 grams (86% on 6/26). Pregnancy has been complicated by chronic placental abruption.  Plan is for delivery via ceasarean section later this afternoon given maternal anemia and x2 PRBC transfusions.   My key findings of this patient's HPI are:  I have reviewed the patient's chart and have met with her. The salient information is as follows:   Mom is admitted to [facility] specialty care for ongoing vaginal bleeding and is receiving x2 PRBC transfusions this AM. Monitoring for fetal/maternal distress with no concerns at this time. Received rescue dose of BMZ, IV mag, and continuous monitoring.    Prenatal labs:   Prenatal care:   good Pregnancy complications:  placental abruption, history of anxiety/depression with SI and prior overdoses, history of prior preterm birth and NICU admission at ~31 weeks (placental abruption) Maternal antibiotics: This patient's mother is not on file. Maternal Steroids: BMZ Most recent dose:  7/11 at 1041   My recommendations for this patient and my actions included:   1. In the presence of the Kristen Padilla, I spent 15 minutes discussing the possible complications and outcomes of prematurity at this gestational age. I discussed specific complications at this gestational age referencing the need for resuscitation at birth due to respiratory distress which may require mechanical ventilation, CPAP, and surfactant administration. In addition infant may require IV fluids pending establishment of enteral feeds (encouraged breast milk  feeding), antibiotics for possible sepsis, temperature support, and continuous monitoring. I also discussed the potential risk of complications such as intracranial hemorrhage, retinopathy, hearing deficit, and chronic lung disease. In addition, we discussed the likely need for blood transfusions for infant given the maternal anemia. I discussed this with parents in detail and they expressed an understanding of the risks and complications of prematurity.   2. I also discussed the expected survival of an infant born at 18 weeks, which is (Moderate). We further discussed that (Few) of the neonates born at this age have profound or severe neurological complications and school difficulties. In addition, (Some) of the neonates born at this age will have some for of mild to moderate neurological complications. She expressed an understanding of this information.   3. I informed her that the NICU team would be present at the delivery. She agreed that all appropriate medical measures could be taken to resuscitate her infant at the delivery. She also understood that our team will always be available for any questions that come up during their infant's hospitalization and we will continue to partner with their family to support them through this difficult time. Visitation policy was discussed and all questions were addressed.   Final Impression:  33 y.o. female with a singleton female IUP who is threatening to deliver and who now understands the possible complications and prognosis of her infant. The mother agrees with plan for resuscitation and ICU care. Kristen Padilla's questions were answered. She is planning to try and provide breast milk for her infant (consents to donor milk).    ______________________________________________________________________  Thank you for asking us  to participate in  the care of this patient. Please do not hesitate to contact us  again if you are aware of any further ways  we can be of assistance.   Sincerely,  Lonni FABIENE Donald, MD Attending Neonatologist   I spent ~35 minutes in consultation time, of which 15 minutes was spent in direct face to face counseling.

## 2024-01-10 ENCOUNTER — Encounter (HOSPITAL_COMMUNITY): Payer: Self-pay | Admitting: Obstetrics and Gynecology

## 2024-01-10 ENCOUNTER — Inpatient Hospital Stay (HOSPITAL_COMMUNITY)

## 2024-01-10 LAB — COMPREHENSIVE METABOLIC PANEL WITH GFR
ALT: 11 U/L (ref 0–44)
AST: 31 U/L (ref 15–41)
Albumin: 2.7 g/dL — ABNORMAL LOW (ref 3.5–5.0)
Alkaline Phosphatase: 53 U/L (ref 38–126)
Anion gap: 14 (ref 5–15)
BUN: 6 mg/dL (ref 6–20)
CO2: 17 mmol/L — ABNORMAL LOW (ref 22–32)
Calcium: 9.2 mg/dL (ref 8.9–10.3)
Chloride: 103 mmol/L (ref 98–111)
Creatinine, Ser: 0.93 mg/dL (ref 0.44–1.00)
GFR, Estimated: >60 mL/min (ref >60)
Glucose, Bld: 171 mg/dL — ABNORMAL HIGH (ref 70–99)
Potassium: 4.5 mmol/L (ref 3.5–5.1)
Sodium: 134 mmol/L — ABNORMAL LOW (ref 135–145)
Total Bilirubin: 0.6 mg/dL (ref 0.0–1.2)
Total Protein: 6.6 g/dL (ref 6.5–8.1)

## 2024-01-10 LAB — CBC
HCT: 35 % — ABNORMAL LOW (ref 36.0–46.0)
Hemoglobin: 11.1 g/dL — ABNORMAL LOW (ref 12.0–15.0)
MCH: 26.3 pg (ref 26.0–34.0)
MCHC: 31.7 g/dL (ref 30.0–36.0)
MCV: 82.9 fL (ref 80.0–100.0)
Platelets: 368 K/uL (ref 150–400)
RBC: 4.22 MIL/uL (ref 3.87–5.11)
RDW: 21.7 % — ABNORMAL HIGH (ref 11.5–15.5)
WBC: 14.9 K/uL — ABNORMAL HIGH (ref 4.0–10.5)
nRBC: 0 % (ref 0.0–0.2)

## 2024-01-10 LAB — TYPE AND SCREEN
ABO/RH(D): O NEG
Antibody Screen: NEGATIVE
Unit division: 0
Unit division: 0

## 2024-01-10 LAB — BPAM RBC
Blood Product Expiration Date: 202507142359
Blood Product Expiration Date: 202508162359
ISSUE DATE / TIME: 202507110710
ISSUE DATE / TIME: 202507111043
Unit Type and Rh: 9500
Unit Type and Rh: 9500

## 2024-01-10 LAB — RH IG WORKUP (INCLUDES ABO/RH)
Fetal Screen: NEGATIVE
Gestational Age(Wks): 26
Unit division: 0

## 2024-01-10 LAB — GLUCOSE, CAPILLARY: Glucose-Capillary: 140 mg/dL — ABNORMAL HIGH (ref 70–99)

## 2024-01-10 MED ORDER — IOHEXOL 350 MG/ML SOLN
75.0000 mL | Freq: Once | INTRAVENOUS | Status: AC | PRN
  Administered 2024-01-10: 75 mL via INTRAVENOUS

## 2024-01-10 MED ORDER — GABAPENTIN 100 MG PO CAPS
100.0000 mg | ORAL_CAPSULE | Freq: Three times a day (TID) | ORAL | Status: DC
  Administered 2024-01-10 – 2024-01-12 (×7): 100 mg via ORAL
  Filled 2024-01-10 (×8): qty 1

## 2024-01-10 NOTE — Plan of Care (Signed)
  Problem: Education: Goal: Knowledge of disease or condition will improve Outcome: Progressing Goal: Knowledge of the prescribed therapeutic regimen will improve Outcome: Progressing Goal: Individualized Educational Video(s) Outcome: Progressing   Problem: Clinical Measurements: Goal: Complications related to the disease process, condition or treatment will be avoided or minimized Outcome: Progressing   Problem: Education: Goal: Knowledge of General Education information will improve Description: Including pain rating scale, medication(s)/side effects and non-pharmacologic comfort measures Outcome: Progressing   Problem: Health Behavior/Discharge Planning: Goal: Ability to manage health-related needs will improve Outcome: Progressing   Problem: Clinical Measurements: Goal: Ability to maintain clinical measurements within normal limits will improve Outcome: Progressing Goal: Will remain free from infection Outcome: Progressing Goal: Diagnostic test results will improve Outcome: Progressing Goal: Respiratory complications will improve Outcome: Progressing Goal: Cardiovascular complication will be avoided Outcome: Progressing   Problem: Activity: Goal: Risk for activity intolerance will decrease Outcome: Progressing   Problem: Nutrition: Goal: Adequate nutrition will be maintained Outcome: Progressing   Problem: Coping: Goal: Level of anxiety will decrease Outcome: Progressing   Problem: Elimination: Goal: Will not experience complications related to bowel motility Outcome: Progressing Goal: Will not experience complications related to urinary retention Outcome: Progressing   Problem: Pain Managment: Goal: General experience of comfort will improve and/or be controlled Outcome: Progressing   Problem: Safety: Goal: Ability to remain free from injury will improve Outcome: Progressing   Problem: Skin Integrity: Goal: Risk for impaired skin integrity will  decrease Outcome: Progressing   Problem: Education: Goal: Knowledge of the prescribed therapeutic regimen will improve Outcome: Progressing Goal: Understanding of sexual limitations or changes related to disease process or condition will improve Outcome: Progressing Goal: Individualized Educational Video(s) Outcome: Progressing   Problem: Self-Concept: Goal: Communication of feelings regarding changes in body function or appearance will improve Outcome: Progressing   Problem: Skin Integrity: Goal: Demonstration of wound healing without infection will improve Outcome: Progressing   Problem: Education: Goal: Knowledge of condition will improve Outcome: Progressing Goal: Individualized Educational Video(s) Outcome: Progressing Goal: Individualized Newborn Educational Video(s) Outcome: Progressing   Problem: Activity: Goal: Will verbalize the importance of balancing activity with adequate rest periods Outcome: Progressing Goal: Ability to tolerate increased activity will improve Outcome: Progressing   Problem: Coping: Goal: Ability to identify and utilize available resources and services will improve Outcome: Progressing   Problem: Life Cycle: Goal: Chance of risk for complications during the postpartum period will decrease Outcome: Progressing   Problem: Role Relationship: Goal: Ability to demonstrate positive interaction with newborn will improve Outcome: Progressing   Problem: Skin Integrity: Goal: Demonstration of wound healing without infection will improve Outcome: Progressing

## 2024-01-10 NOTE — Progress Notes (Signed)
 Subjective: POD# 1 Information for the patient's newborn:  Kristen Padilla, Kristen Padilla [[id number]]  female  Baby's Name undecided Circumcision 26 wk in NICU  Reports feeling good, lengthy conversation about decision to deliver at 26 wks. Mother still processing birth. Reports feeling so much better now that I am not bleeding.  Feeding: in NICU, mom not pumping at this time Reports tolerating PO and denies N/V, foley removed, ambulating and urinating w/o difficulty  Pain controlled with PO meds Denies HA/SOB/dizziness  Flatus passing Vaginal bleeding is normal, no clots     Objective:  VS:  Vitals:   01/09/24 2149 01/09/24 2153 01/09/24 2352 01/10/24 0311  BP: 102/68 110/66 113/70 103/78  Pulse: 91 89 64 62  Resp:   17 16  Temp:   97.6 F (36.4 C)   TempSrc:   Oral   SpO2:   98% 100%  Weight:      Height:        Intake/Output Summary (Last 24 hours) at 01/10/2024 0634 Last data filed at 01/10/2024 0548 Gross per 24 hour  Intake 2706.85 ml  Output 2233 ml  Net 473.85 ml     Recent Labs    01/09/24 1648 01/10/24 0529  WBC 12.7* 14.9*  HGB 11.1* 11.1*  HCT 34.7* 35.0*  PLT 350 368    Blood type: --/--/O NEG (07/11 0557) Rubella:   immune   Physical Exam:  General: alert, cooperative, and no distress CV: Regular rate and rhythm Resp: clear Abdomen: soft, nontender, normal bowel sounds Incision: clean, dry, and intact Perineum:  Uterine Fundus: firm, below umbilicus, nontender Lochia: minimal Ext: no edema, negative for tenderness, pain, and cords   Assessment/Plan: 33 y.o.   POD# 1. [id number]                  Principal Problem:   Postpartum care following cesarean delivery Active Problems:   Vaginal bleeding in pregnancy, second trimester   Routine post-op PP care          Advance diet as tolerated Advised warm fluids and ambulation to improve GI motility Lactation support PRN Contraception: did not discuss Anticipate D/C POD 2  Emmylou Bieker B Johntay Doolen, DNP,  CNM 01/10/2024, 6:34 AM

## 2024-01-11 ENCOUNTER — Encounter (HOSPITAL_COMMUNITY): Payer: Self-pay | Admitting: Obstetrics and Gynecology

## 2024-01-11 NOTE — Progress Notes (Signed)
 Subjective: POD# 2 Information for the patient's newborn:  Evalyse, Stroope [[id number]]  female   Reports feeling good, but tired Feeding: NICU Ambulating and urinating w/o difficulty  Pain controlled with PO meds Denies HA/SOB/dizziness  Flatus passing Vaginal bleeding is normal, no clots     Objective:  VS:  Vitals:   01/10/24 1733 01/10/24 2203 01/10/24 2208 01/10/24 2351  BP: (!) 89/48 (!) 93/57  (!) 101/51  Pulse: 62 69  77  Resp: 18 18  18   Temp: 97.7 F (36.5 C) 98 F (36.7 C) 98.2 F (36.8 C) 97.6 F (36.4 C)  TempSrc: Oral Oral Oral Oral  SpO2: 100% 100%  100%  Weight:      Height:        Intake/Output Summary (Last 24 hours) at 01/11/2024 0721 Last data filed at 01/10/2024 1758 Gross per 24 hour  Intake 880.18 ml  Output 200 ml  Net 680.18 ml     Recent Labs    01/09/24 1648 01/10/24 0529  WBC 12.7* 14.9*  HGB 11.1* 11.1*  HCT 34.7* 35.0*  PLT 350 368    Blood type: --/--/O NEG (07/11 0557) Rubella:      Physical Exam:  General: alert, cooperative, and no distress CV: Regular rate and rhythm Resp: clear Abdomen: soft, nontender, normal bowel sounds Incision: clean, dry, and intact Perineum:  Uterine Fundus: firm, below umbilicus, nontender Lochia: minimal Ext: no edema, negative for tenderness, pain, and cords   Assessment/Plan: 33 y.o.   POD# 2. [id number]                  Principal Problem:   Postpartum care following cesarean delivery Active Problems:   Vaginal bleeding in pregnancy, second trimester   Routine post-op PP care          Advance diet as tolerated Advised warm fluids and ambulation to improve GI motility Lactation support PRN Contraception: options reviewed Anticipate D/C POD 3  Mercer KATHEE Peal, DNP, CNM 01/11/2024, 7:21 AM

## 2024-01-11 NOTE — Plan of Care (Signed)
  Problem: Education: Goal: Knowledge of disease or condition will improve Outcome: Progressing Goal: Knowledge of the prescribed therapeutic regimen will improve Outcome: Progressing Goal: Individualized Educational Video(s) Outcome: Progressing   Problem: Clinical Measurements: Goal: Complications related to the disease process, condition or treatment will be avoided or minimized Outcome: Progressing   Problem: Education: Goal: Knowledge of General Education information will improve Description: Including pain rating scale, medication(s)/side effects and non-pharmacologic comfort measures Outcome: Progressing   Problem: Health Behavior/Discharge Planning: Goal: Ability to manage health-related needs will improve Outcome: Progressing   Problem: Clinical Measurements: Goal: Ability to maintain clinical measurements within normal limits will improve Outcome: Progressing Goal: Will remain free from infection Outcome: Progressing Goal: Diagnostic test results will improve Outcome: Progressing Goal: Respiratory complications will improve Outcome: Progressing Goal: Cardiovascular complication will be avoided Outcome: Progressing   Problem: Activity: Goal: Risk for activity intolerance will decrease Outcome: Progressing   Problem: Nutrition: Goal: Adequate nutrition will be maintained Outcome: Progressing   Problem: Coping: Goal: Level of anxiety will decrease Outcome: Progressing   Problem: Elimination: Goal: Will not experience complications related to bowel motility Outcome: Progressing Goal: Will not experience complications related to urinary retention Outcome: Progressing   Problem: Pain Managment: Goal: General experience of comfort will improve and/or be controlled Outcome: Progressing   Problem: Safety: Goal: Ability to remain free from injury will improve Outcome: Progressing   Problem: Skin Integrity: Goal: Risk for impaired skin integrity will  decrease Outcome: Progressing   Problem: Education: Goal: Knowledge of the prescribed therapeutic regimen will improve Outcome: Progressing Goal: Understanding of sexual limitations or changes related to disease process or condition will improve Outcome: Progressing Goal: Individualized Educational Video(s) Outcome: Progressing   Problem: Self-Concept: Goal: Communication of feelings regarding changes in body function or appearance will improve Outcome: Progressing   Problem: Skin Integrity: Goal: Demonstration of wound healing without infection will improve Outcome: Progressing   Problem: Education: Goal: Knowledge of condition will improve Outcome: Progressing Goal: Individualized Educational Video(s) Outcome: Progressing Goal: Individualized Newborn Educational Video(s) Outcome: Progressing   Problem: Activity: Goal: Will verbalize the importance of balancing activity with adequate rest periods Outcome: Progressing Goal: Ability to tolerate increased activity will improve Outcome: Progressing   Problem: Coping: Goal: Ability to identify and utilize available resources and services will improve Outcome: Progressing   Problem: Life Cycle: Goal: Chance of risk for complications during the postpartum period will decrease Outcome: Progressing   Problem: Role Relationship: Goal: Ability to demonstrate positive interaction with newborn will improve Outcome: Progressing   Problem: Skin Integrity: Goal: Demonstration of wound healing without infection will improve Outcome: Progressing

## 2024-01-11 NOTE — Lactation Note (Signed)
 This note was copied from a baby's chart.  NICU Lactation Consultation Note  Patient Name: Kristen Padilla Unijb'd Date: 01/11/2024 Age:32 hours  Reason for consult: Follow-up assessment; NICU baby; Maternal discharge; Preterm <34wks; Infant < 5lbs; Other (Comment) (Maternal mental illnes)  SUBJECTIVE  LC met with P3 Mom of baby [name] born by C/Section at [redacted]w[redacted]d and is on a ventilator at 26% O2.  Baby is NPO currently.  Mom states she was able to hold baby STS earlier.  Mom resting in bed.  Mom states she is pumping and expressing good volumes now.  Mom has breast milk labels and knows to take any EBM to baby.  No STORK pump delivered over the weekend.   LC sent a referral to [facility]   Mom encouraged to continue her consistent pumping with a goal of 8 times per 24 hrs.  Mom aware of lactation support while baby is in the NICU.  OBJECTIVE Infant data: No data recorded O2 Device: Other (Comment) (NIPPV) FiO2 (%): 26 %  Infant feeding assessment No data recorded  Maternal data: [id number] C-Section, Low Transverse Pumping frequency: 8 times per 24 hrs Pumped volume: 60 mL Flange Size: 18  WIC Program: Yes WIC Referral Sent?: Yes What county?: [place] Pump: Referral sent for Stork Pump  ASSESSMENT Infant:  Feeding Status: NPO Feeding method: Tube/Gavage (Bolus)  Maternal: Milk volume: Normal  INTERVENTIONS/PLAN Interventions: Interventions: Breast feeding basics reviewed; Skin to skin; Breast massage; Hand express; DEBP; Education Tools: Pump; Flanges Pump Education: Setup, frequency, and cleaning; Milk Storage  Plan: Consult Status: NICU follow-up NICU Follow-up type: Maternal D/C visit; Verify onset of copious milk; Verify absence of engorgement; Verify DEBP issuance   [name] 01/11/2024, 6:06 PM

## 2024-01-12 MED ORDER — GABAPENTIN 100 MG PO CAPS: 100.0000 mg | ORAL_CAPSULE | Freq: Three times a day (TID) | ORAL | 0 refills | Status: DC | PRN

## 2024-01-12 MED ORDER — ACETAMINOPHEN 500 MG PO TABS: 1000.0000 mg | ORAL_TABLET | Freq: Four times a day (QID) | ORAL | 0 refills | Status: AC

## 2024-01-12 MED ORDER — OXYCODONE HCL 5 MG PO TABS: 5.0000 mg | ORAL_TABLET | Freq: Four times a day (QID) | ORAL | 0 refills | Status: DC | PRN

## 2024-01-12 MED ORDER — BUPROPION HCL ER (XL) 150 MG PO TB24: 150.0000 mg | ORAL_TABLET | Freq: Every day | ORAL | 0 refills | Status: DC

## 2024-01-12 MED ORDER — SENNOSIDES-DOCUSATE SODIUM 8.6-50 MG PO TABS: 2.0000 | ORAL_TABLET | Freq: Every day | ORAL | 1 refills | Status: DC

## 2024-01-12 NOTE — Discharge Summary (Signed)
 Postpartum Discharge Summary  Date of Service updated7/14/25     Patient Name: Kristen Padilla DOB: 19-Jan-1991 MRN: [id number]  Date of admission: 01/04/2024 Delivery date:01/09/2024 Delivering provider: HENRY SLOUGH Date of discharge: 01/12/2024  Admitting diagnosis: Vaginal bleeding in pregnancy, second trimester [O46.92] Intrauterine pregnancy: [redacted]w[redacted]d     Secondary diagnosis:  Principal Problem:   Postpartum care following cesarean delivery Active Problems:   Vaginal bleeding in pregnancy, second trimester  Additional problems: depression Chronic abruption    Discharge diagnosis: Preterm Pregnancy Delivered, Anemia, and chronic abruption                                              Post partum procedures:blood transfusion and rhogam Augmentation: N/A Complications: Placental Abruption  Hospital course: Sceduled C/S   33 y.o. yo [id number] at [redacted]w[redacted]d was admitted to the hospital 01/04/2024 for scheduled cesarean section with the following indication:Prior Uterine Surgery and chr.Delivery details are as follows:  Membrane Rupture Time/Date: 3:42 PM,01/09/2024  Delivery Method:C-Section, Low Transverse Operative Delivery:N/A Details of operation can be found in separate operative note.  Patient had a postpartum course complicated bychronic abruption.  She is ambulating, tolerating a regular diet, passing flatus, and urinating well. Patient is discharged home in stable condition on  01/12/24        Newborn Data: Birth date:01/09/2024 Birth time:3:43 PM Gender:Female Living status:Living Apgars:7 ,8  Weight:1060 g    Magnesium  Sulfate received: Yes: neuroprotection and stop preterm contractions BMZ received: Yes Rhophylac :Yes MMR:No T-DaP:Given postpartum Flu: No RSV Vaccine received: No Transfusion:Yes Immunizations administered: Immunization History  Administered Date(s) Administered   MMR 06/30/2022   PFIZER(Purple Top)SARS-COV-2 Vaccination 12/10/2019,  12/31/2019    Physical exam  Vitals:   01/11/24 2200 01/11/24 2347 01/12/24 0753 01/12/24 1410  BP: (!) 107/56 (!) 98/55 108/66   Pulse: 76 88 66 (P) 76  Resp: 18 19 17  (P) 18  Temp: 98 F (36.7 C) 98 F (36.7 C) 97.8 F (36.6 C) (P) 98 F (36.7 C)  TempSrc: Oral Oral Oral (P) Oral  SpO2: 99% 100% 98% (P) 99%  Weight:      Height:       General: alert and cooperative Lochia: appropriate Uterine Fundus: firm Incision: Healing well with no significant drainage, No significant erythema DVT Evaluation: No evidence of DVT seen on physical exam. Labs: Lab Results  Component Value Date   WBC 14.9 (H) 01/10/2024   HGB 11.1 (L) 01/10/2024   HCT 35.0 (L) 01/10/2024   MCV 82.9 01/10/2024   PLT 368 01/10/2024      Latest Ref Rng & Units 01/10/2024    5:29 AM  CMP  Glucose 70 - 99 mg/dL 828   BUN 6 - 20 mg/dL 6   Creatinine 9.55 - 8.99 mg/dL 9.06   Sodium 864 - 854 mmol/L 134   Potassium 3.5 - 5.1 mmol/L 4.5   Chloride 98 - 111 mmol/L 103   CO2 22 - 32 mmol/L 17   Calcium  8.9 - 10.3 mg/dL 9.2   Total Protein 6.5 - 8.1 g/dL 6.6   Total Bilirubin 0.0 - 1.2 mg/dL 0.6   Alkaline Phos 38 - 126 U/L 53   AST 15 - 41 U/L 31   ALT 0 - 44 U/L 11    Edinburgh Score:    01/10/2024   10:05 PM  [facility]  Postnatal Depression Scale Screening Tool  I have been able to laugh and see the funny side of things. 0  I have looked forward with enjoyment to things. 0  I have blamed myself unnecessarily when things went wrong. 2  I have been anxious or worried for no good reason. 2  I have felt scared or panicky for no good reason. 2  Things have been getting on top of me. 2  I have been so unhappy that I have had difficulty sleeping. 2  I have felt sad or miserable. 1  I have been so unhappy that I have been crying. 1  The thought of harming myself has occurred to me. 0  Edinburgh Postnatal Depression Scale Total 12      After visit meds:  Allergies as of 01/12/2024       Reactions    Nsaids    History of gastric sleeve surgery Add to allergy list per pharmacy to alert providers**   Progesterone  Swelling   Vaginal swelling when taken vaginally   Aripiprazole Rash   Prenatal Vitamins Rash   States she got a rash from taking the prenatal the hospital offers    Sulfa Antibiotics Rash        Medication List     TAKE these medications    acetaminophen  500 MG tablet Commonly known as: TYLENOL  Take 2 tablets (1,000 mg total) by mouth every 6 (six) hours.   buPROPion  150 MG 24 hr tablet Commonly known as: Wellbutrin  XL Take 1 tablet (150 mg total) by mouth daily.   cyclobenzaprine  5 MG tablet Commonly known as: FLEXERIL  Take 1 tablet (5 mg total) by mouth 3 (three) times daily as needed for muscle spasms.   gabapentin  100 MG capsule Commonly known as: NEURONTIN  Take 1 capsule (100 mg total) by mouth every 8 (eight) hours as needed (burning).   metroNIDAZOLE  500 MG tablet Commonly known as: FLAGYL  Take 1 tablet (500 mg total) by mouth 2 (two) times daily.   oxyCODONE  5 MG immediate release tablet Commonly known as: Oxy IR/ROXICODONE  Take 1 tablet (5 mg total) by mouth every 6 (six) hours as needed (pain scale 4-7).   senna-docusate 8.6-50 MG tablet Commonly known as: Senokot-S Take 2 tablets by mouth daily. Start taking on: January 13, 2024         Discharge home in stable condition Infant Feeding: Bottle and Breast Infant Disposition:NICU Discharge instruction: per After Visit Summary and Postpartum booklet. Activity: Advance as tolerated. Pelvic rest for 6 weeks.  Diet: routine diet Anticipated Birth Control: Unsure Postpartum Appointment:1 week Additional Postpartum F/U: Postpartum Depression checkup Future Appointments:No future appointments. Follow up Visit:  Follow-up Information     Henry Slough, MD Follow up.   Specialty: Obstetrics and Gynecology Contact information: [address] [place]  [address] [phone]         [facility] Obstetrics & Gynecology. Schedule an appointment as soon as possible for a visit in 1 week(s).   Specialty: Obstetrics and Gynecology Contact information: [address] [place]  [address] [phone]                    01/12/2024 Ovid DELENA All, MD

## 2024-01-12 NOTE — Lactation Note (Signed)
 This note was copied from a baby's chart. Lactation Consultation Note  Patient Name: Kristen Padilla Unijb'd Date: 01/12/2024 Age:32 hours Reason for consult: Follow-up assessment;NICU baby;Exclusive pumping and bottle feeding;Preterm <34wks;Infant < 5lbs  P3- Infant is now [redacted]w[redacted]d GA and weighing 1060g. MOB reports that the doctors informed her that infant is doing well, so she feels confident with how everything has been going. MOB reports that her plan is to discharge today, stay at home during the day with her older two children, and then sleep in infant's room at night. MOB received her STORK pump on 7/12 and has been using it instead of the hospital DEBP. MOB reports that her milk supply went down yesterday for some reason, but now her milk came in and she is pumping between 6-8 ounces combined with every pumping session. MOB denies experiencing engorgement, but understands what to do if she develops it. MOB denies having any questions, concerns or needing supplies before discharging. LC reviewed the [facility] services handout with her. LC encouraged MOB to call for further assistance as needed when discharged.  Maternal Data Has patient been taught Hand Expression?: Yes Does the patient have breastfeeding experience prior to this delivery?: Yes  Feeding Mother's Current Feeding Choice: Breast Milk  Lactation Tools Discussed/Used Tools: Pump;Flanges Flange Size: 18 Breast pump type: Double-Electric Breast Pump;Manual Pump Education: Setup, frequency, and cleaning;Milk Storage Reason for Pumping: Infant prematurity and admission to the NICU Pumping frequency: 15-20 min every 3 hrs  Interventions Interventions: Breast feeding basics reviewed;Expressed milk;Hand pump;DEBP;Education;LC Services brochure;LPT handout/interventions;CDC milk storage guidelines  Discharge Discharge Education: Engorgement and breast care;Warning signs for feeding baby Pump: Received Stork Pump  Consult  Status Consult Status: NICU follow-up Date: 01/13/24 Follow-up type: In-patient    Recardo Hoit BS, IBCLC 01/12/2024, 4:25 PM

## 2024-01-12 NOTE — Clinical Social Work Maternal (Addendum)
 CLINICAL SOCIAL WORK MATERNAL/CHILD NOTE  Patient Details  Name: Kristen Padilla MRN: [id number] Date of Birth: 24-Mar-1991  Date:  01/12/2024  Clinical Social Worker Initiating Note:  Nat Quiet, [place] Date/Time: Initiated:  01/12/24/1056     Child's Name:  Kristen Padilla   Biological Parents:  Mother, Father (MOB: Kristen Padilla 1990/11/27, FOB: Kristen Padilla 12[birth year]04-1980)   Need for Interpreter:  None   Reason for Referral:  Behavioral Health Concerns, Other (Comment) (NICU admission)   Address:  [address] [place] [address]    Phone number:  [phone]  Additional phone number:   Household Members/Support Persons (HM/SP):   Household Member/Support Person 1   HM/SP Name Relationship DOB or Age  HM/SP -1 Kristen Padilla Significant Other 12/04/1980  HM/SP -2        HM/SP -3        HM/SP -4        HM/SP -5        HM/SP -6        HM/SP -7        HM/SP -8          Natural Supports (not living in the home):  Spouse/significant other   Professional Supports: None   Employment: Unemployed   Type of Work:     Education:  9 to 11 years   Homebound arranged: No  Financial Resources:  Medicaid   Other Resources:      Cultural/Religious Considerations Which May Impact Care:    Strengths:  Ability to meet basic needs  , Home prepared for child     Psychotropic Medications:         Pediatrician:       Pediatrician List:   [employer]    [place]    [place] [facility]      Pediatrician Fax Number:    Risk Factors/Current Problems:  Mental Health Concerns     Cognitive State:  Able to Concentrate  , Alert     Mood/Affect:  Calm  , Interested     CSW Assessment: CSW received consult for [place] and NICU admission. CSW met with MOB to offer support and complete assessment.   CSW met with MOB at bedside and reintroduced role. MOB was lying in bed and  was receptive to CSW visit. MOB appeared calm and engaged with CSW throughout the assessment. MOB recognized CSW from previous assessment completed prior her giving birth. CSW congratulated MOB on her baby boy, [name]. MOB shared that her infant's name was Kristen "[name]" Padilla. CSW acknowledged infant's NICU admission and asked MOB how she had been doing.  MOB expressed that she was currently feeling good and looking forward to seeing her son in the NICU. She shared that she has been producing breastmilk and has been able to pump a good amount for her baby.  CSW acknowledged MOB's feelings and encouraged her efforts. CSW asked MOB if she felt well-informed by NICU staff. MOB reported that the NICU staff had been keeping her updated, and that she was informed regarding an emergent procedure for her infant. CSW inquired about MOB's supports during this time. MOB identified her partner, Kristen as her primary support during this time. CSW discussed NICU support services and offered to check in with her while the infant remains in the NICU. MOB was receptive to CSW checking in and offering support.  CSW discussed MOB's [place] (  12). MOB shared her history of severe postpartum depression and that she is concerned about experiencing symptoms related to her infant's NICU admission and current social stressors. She shared that her family has not been accepting of her current partner, which is contributing to her stress. She acknowledged the importance of addressing her mental health and expressed interest in receiving support.  CSW acknowledged MOB's request for mental health support and offered to assist her with notifying her OBGYN provider regarding her interest in starting medication. She expressed appreciation for the support. CSW provided education regarding the baby blues period vs. perinatal mood disorders, discussed treatment and gave resources for therapy. CSW recommended MOB complete a self-evaluation  during the postpartum time period using the New Mom Checklist from [employer]partum [employer] and encouraged MOB to contact a medical professional if symptoms are noted at any time.  CSW assessed MOB for safety. MOB denied having thoughts of harming herself and others. MOB denied domestic violence concerns.  CSW inquired about MOB's household situation. MOB reported that she lives with her partner Kristen, and her children Kristen Padilla and Kristen Padilla) stay with her part-time when they are not with their fathers, with whom she has a custody agreement in place. CSW inquired if MOB is employed. MOB reported that she is unemployed, and FOB is employed as a [employer]er. Mob reported that she and FOB have essential item to care their infant including a crib and car seat. CSW inquired about WIC/SNAP benefits. MOB reported that was still not interested in WIC/SNAP at this time.  CSW notified MOB's medical provider regarding her request to start mental health medication. CSW acknowledged infant's gestational age/weight and provided information for SSI. CSW informed MOB that social security admin. would determine SSI eligibility. CSW asked if MOB had questions. MOB reported none.    CSW will continue to offer support and resources to family while infant remains in NICU.   CSW Plan/Description:  Psychosocial Support and Ongoing Assessment of Needs, Perinatal Mood and Anxiety Disorder (PMADs) Education, Sudden Infant Death Syndrome (SIDS) Education, No Further Intervention Required/No Barriers to Discharge    Nat DELENA Quiet, LCSW 01/12/2024, 11:15 AM

## 2024-01-13 LAB — SURGICAL PATHOLOGY

## 2024-01-21 ENCOUNTER — Telehealth (HOSPITAL_COMMUNITY): Payer: Self-pay | Admitting: *Deleted

## 2024-01-21 NOTE — Telephone Encounter (Signed)
 01/21/2024  Name: Kristen Padilla MRN: [id number] DOB: 05-10-1991  Reason for Call:  Transition of Care Hospital Discharge Call  Contact Status: Patient Contact Status: Unable to contact (call cannot be completed, unable to leave a message)  Language assistant needed:          Follow-Up Questions:    [place] Postnatal Depression Scale:  In the Past 7 Days:    PHQ2-9 Depression Scale:     Discharge Follow-up:    Post-discharge interventions: NA  Mliss Sieve, RN 01/21/2024 16:02

## 2024-01-25 ENCOUNTER — Inpatient Hospital Stay (HOSPITAL_COMMUNITY)
Admission: AD | Admit: 2024-01-25 | Discharge: 2024-01-25 | Disposition: A | Discharge status: Home / Self Care | Attending: Obstetrics and Gynecology | Admitting: Obstetrics and Gynecology

## 2024-01-25 ENCOUNTER — Encounter (HOSPITAL_COMMUNITY): Payer: Self-pay | Admitting: Obstetrics and Gynecology

## 2024-01-25 ENCOUNTER — Other Ambulatory Visit: Payer: Self-pay

## 2024-01-25 DIAGNOSIS — T148XXA Other injury of unspecified body region, initial encounter: Secondary | ICD-10-CM

## 2024-01-25 NOTE — MAU Note (Addendum)
 Kristen Padilla is a 33 y.o. at 16 days PP here in MAU reporting: incisional pain, purulent discharge and odor. 16 days post CD on 01/09/24. Denies fever, chills, nausea, vomiting. No other complaints.   Onset of complaint: 1 week ago Pain score: 8/10 Vitals:   01/25/24 1617  BP: 93/60  Pulse: 87  Resp: 20  Temp: 98.2 F (36.8 C)  SpO2: 98%

## 2024-01-25 NOTE — MAU Provider Note (Signed)
 MAU Provider Note  Chief Complaint: Wound Check  SUBJECTIVE HPI: Kristen Padilla is a 33 y.o. H1E8746 postpartum day 74 after rLTCS for chronic placental abruption at [redacted]w[redacted]d by early ultrasound who presents to maternity admissions reporting incision itching and a smell. Worried about infection. Denies abdominal or incisional pain, discharge, fever/chills. Lochia appropriate, dark scant.   Pregnancy c/b chronic placental abruption.   Receives [facility] with [facility].   HPI  Past Medical History:  Diagnosis Date   Anxiety, generalized    Bupropion  overdose 01/12/2023   Wellbutrin  OD suicide attempt.     Closed fracture of left distal radius 03/29/2020   Added automatically from request for surgery [id number]     Depression    Leukocytosis 05/21/2020   Miscarriage    Morbid obesity (HCC)    Overdose 05/22/2020   Placental abruption in third trimester 06/28/2022   Second trimester bleeding 06/17/2022   Suicidal behavior with attempted self-injury (HCC) 2014   ibuprofen  OD   Suicidal behavior with attempted self-injury (HCC) 2022   tylenol  and NyQuil OD   Tylenol  overdose, intentional self-harm, initial encounter (HCC) 05/21/2020   UTI (urinary tract infection)    Vaginal bleeding in pregnancy, third trimester 06/27/2022   Past Surgical History:  Procedure Laterality Date   CESAREAN SECTION N/A 06/29/2022   Procedure: CESAREAN SECTION;  Surgeon: Gloriann Chick, MD;  Location: [facility] LD ORS;  Service: Obstetrics;  Laterality: N/A;   CESAREAN SECTION N/A 01/09/2024   Procedure: CESAREAN DELIVERY;  Surgeon: Henry Slough, MD;  Location: [facility] LD ORS;  Service: Obstetrics;  Laterality: N/A;   HERNIA REPAIR     LAPAROSCOPIC GASTRIC SLEEVE RESECTION     Repair of displaced fracture of the left radius  03/2020   Social History   Socioeconomic History   Marital status: Married    Spouse name: Not on file   Number of children: 1   Years of education: Not on file   Highest education level:  High school graduate  Occupational History   Not on file  Tobacco Use   Smoking status: Never   Smokeless tobacco: Never  Vaping Use   Vaping status: Never Used  Substance and Sexual Activity   Alcohol use: No   Drug use: No   Sexual activity: Yes    Birth control/protection: None  Other Topics Concern   Not on file  Social History Narrative   Not on file   Social Drivers of Health   Financial Resource Strain: Low Risk  (06/05/2020)   Overall Financial Resource Strain (CARDIA)    Difficulty of Paying Living Expenses: Not very hard  Food Insecurity: No Food Insecurity (01/04/2024)   Hunger Vital Sign    Worried About Running Out of Food in the Last Year: Never true    Ran Out of Food in the Last Year: Never true  Transportation Needs: No Transportation Needs (01/04/2024)   PRAPARE - Administrator, Civil Service (Medical): No    Lack of Transportation (Non-Medical): No  Physical Activity: Insufficiently Active (08/10/2020)   Exercise Vital Sign    Days of Exercise per Week: 2 days    Minutes of Exercise per Session: 50 min  Stress: Stress Concern Present (06/05/2020)   [employer] of Occupational Health - Occupational Stress Questionnaire    Feeling of Stress : Very much  Social Connections: Unknown (11/11/2021)   Received from [facility]   Social Network    Social Network: Not on file  Intimate  Partner Violence: Not At Risk (01/04/2024)   Humiliation, Afraid, Rape, and Kick questionnaire    Fear of Current or Ex-Partner: No    Emotionally Abused: No    Physically Abused: No    Sexually Abused: No   No current facility-administered medications on file prior to encounter.   Current Outpatient Medications on File Prior to Encounter  Medication Sig Dispense Refill   acetaminophen  (TYLENOL ) 500 MG tablet Take 2 tablets (1,000 mg total) by mouth every 6 (six) hours. 30 tablet 0   gabapentin  (NEURONTIN ) 100 MG capsule Take 1 capsule (100 mg total) by mouth  every 8 (eight) hours as needed (burning). 30 capsule 0   oxyCODONE  (OXY IR/ROXICODONE ) 5 MG immediate release tablet Take 1 tablet (5 mg total) by mouth every 6 (six) hours as needed (pain scale 4-7). 18.75 tablet 0   buPROPion  (WELLBUTRIN  XL) 150 MG 24 hr tablet Take 1 tablet (150 mg total) by mouth daily. 30 tablet 0   cyclobenzaprine  (FLEXERIL ) 5 MG tablet Take 1 tablet (5 mg total) by mouth 3 (three) times daily as needed for muscle spasms. (Patient not taking: Reported on 11/12/2023) 20 tablet 0   metroNIDAZOLE  (FLAGYL ) 500 MG tablet Take 1 tablet (500 mg total) by mouth 2 (two) times daily. (Patient not taking: Reported on 12/23/2023) 14 tablet 0   senna-docusate (SENOKOT-S) 8.6-50 MG tablet Take 2 tablets by mouth daily. 30 tablet 1   Allergies  Allergen Reactions   Nsaids     History of gastric sleeve surgery Add to allergy list per pharmacy to alert providers**   Progesterone  Swelling    Vaginal swelling when taken vaginally   Aripiprazole Rash   Prenatal Vitamins Rash    States she got a rash from taking the prenatal the hospital offers    Sulfa Antibiotics Rash    ROS:  Pertinent positives/negatives listed above.  I have reviewed patient's Past Medical Hx, Surgical Hx, Family Hx, Social Hx, medications and allergies.   Physical Exam  Patient Vitals for the past 24 hrs:  BP Temp Temp src Pulse Resp SpO2 Height Weight  01/25/24 1636 100/60 -- -- 77 -- 99 % -- --  01/25/24 1629 (!) 94/58 -- -- 77 -- -- -- --  01/25/24 1617 93/60 98.2 F (36.8 C) Oral 87 20 98 % -- --  01/25/24 1611 -- -- -- -- -- -- 5' 2 (1.575 m) 76.8 kg   Constitutional: Well-developed, well-nourished female in no acute distress  Cardiovascular: normal rate Respiratory: normal effort GI: Abd soft, non-tender MS: Extremities nontender, no edema, normal ROM Neurologic: Alert and oriented x 4  GU: Neg CVAT. Incision: clean, moist, excoriation on superior margins, well approximated, non-tender, no  warmth  LAB RESULTS No results found for this or any previous visit (from the past 24 hours).  --/--/O NEG (07/11 0557)  IMAGING CT PELVIS W CONTRAST Result Date: 01/10/2024 CLINICAL DATA:  Obstetrician requested study. C-section performed recently in which obstetrician noted a cord-like structure on the right side of the lower uterine segment above the bladder after closing the uterus. We are asked to evaluate for an aberrant vessel versus abnormality of the right ureter. EXAM: CT PELVIS WITH CONTRAST TECHNIQUE: Multidetector CT imaging of the pelvis was performed using the standard protocol following the bolus administration of intravenous contrast. RADIATION DOSE REDUCTION: This exam was performed according to the departmental dose-optimization program which includes automated exposure control, adjustment of the mA and/or kV according to patient size and/or use of  iterative reconstruction technique. CONTRAST:  75mL OMNIPAQUE  IOHEXOL  350 MG/ML SOLN COMPARISON:  CT with IV contrast 11/16/2021. FINDINGS: Urinary Tract: The pelvic ureters are difficult to follow due to the enlarged postpartum uterus, but they are normal in caliber where visible. There is mild wall thickening versus underdistention of the bladder. Bowel: No dilatation or wall thickening including the retrocecal appendix. Vascular/Lymphatic: There is pelvic venous congestion. There is hyperemia in the walls of the uterus. No other significant vascular findings. No pelvic or inguinal adenopathy. Reproductive: Enlarged hyperemic postpartum uterus. Linear C-section incision in the ventral lower uterine segment. Small volume of fluid and gas pockets within the uterine cavity, nonspecific with recent C-section. This is probably normal postoperative change but please correlate clinically for endometritis. The ovaries are not enlarged. The cervix is not well enough visualized to assess for openness but there is at least no evidence of a cervical  mass. No uterine wall mass is seen either. Other: No abnormal structure is seen in the location of the palpable abnormality described by the obstetrician. The only notable changes since 2023 are the presence of the pelvic venous congestion on both sides and the size and hyperemia of the uterus. There is scattered free air in the pelvis and right and medial left rectus sheaths consistent with the recent procedure. Small scattered gas pockets along the surgical incision as well. Only minimal free fluid is seen. No free hemorrhage is evident. No localizing fluid collection. Musculoskeletal: No suspicious bone lesions identified. IMPRESSION: 1. No abnormal structure is seen in the location of the palpable abnormality described by the obstetrician. The only notable changes since 2023 are the presence of the pelvic venous congestion on both sides and the size and hyperemia of the uterus. 2. Enlarged hyperemic postpartum uterus with small volume of fluid and gas pockets in the uterine cavity, nonspecific with recent C-section. This is probably normal postoperative change but please correlate clinically for endometritis. 3. Scattered free air in the pelvis and right and medial left rectus sheaths consistent with the recent procedure. 4. Mild bladder wall thickening versus underdistention. 5. Pelvic venous congestion. Electronically Signed   By: Francis Quam M.D.   On: 01/10/2024 04:24   US  MFM OB LIMITED Result Date: 01/05/2024 ----------------------------------------------------------------------  OBSTETRICS REPORT                       (Signed Final 01/05/2024 02:14 pm) ---------------------------------------------------------------------- Patient Info  ID #:       [id number]                          D.O.B.:  09-Apr-1991 (32 yrs)(F)  Name:       TAWNI MOELLERS ANN               Visit Date: 01/05/2024 08:54 am              LEOS[facility]TORRES ---------------------------------------------------------------------- Performed By   Attending:        Nathanel Fetters      Ref. Address:     [facility]                    MD  Performed By:     Maybell Hair RDMS      Secondary Phy.:   [facility] MAU/Triage  Referred By:      NORLEEN GAILS                 [facility]                    [facility] MD                              [facility] ---------------------------------------------------------------------- Orders  #  Description                           Code        Ordered By  1  US  MFM OB LIMITED                     [id number]    NAIMA DILLARD ----------------------------------------------------------------------  #  Order #                     Accession #                Episode #  1  [id number]                   [id number]                 [id number] ---------------------------------------------------------------------- Indications  [redacted] weeks gestation of pregnancy                Z3A.25  [redacted] weeks gestation of pregnancy                Z3A.25  Vaginal bleeding in pregnancy, second          O46.92  trimester  Poor obstetric history: Previous preterm       O09.219  delivery (31 weeks/short cervix/partial  abruption)  Obesity complicating pregnancy, second         O99.212  trimester (BMI 34)  History of cesarean delivery, currently        O34.219  pregnant ---------------------------------------------------------------------- Fetal Evaluation  Num Of Fetuses:         1  Fetal Heart Rate(bpm):  138  Cardiac Activity:       Observed  Presentation:           Cephalic  Placenta:               Anterior  Amniotic Fluid  AFI FV:      Within normal limits  AFI Sum(cm)     %Tile       Largest Pocket(cm)  12.9            34          4.6  RUQ(cm)       RLQ(cm)       LUQ(cm)        LLQ(cm)  4.6           1.6           3.8            2.9 ---------------------------------------------------------------------- OB History  Blood Type:   O-  Maternal Racial/Ethnic Group:   White  Gravidity:    8         Term:   1        Prem:   1        SAB:   5  TOP:          0        Ectopic:  0        Living: 2 ---------------------------------------------------------------------- Gestational Age  LMP:  26w 4d        Date:  07/03/23                   EDD:   04/08/24  Best:          lynita 6d     Det. By:  Early Ultrasound         EDD:   04/13/24                                      (08/14/23) ---------------------------------------------------------------------- Anatomy  Cranium:               Appears normal         Cord Vessels:           Appears normal (3                                                                        vessel cord)  Thoracic:              Appears normal         Kidneys:                Appear normal  Diaphragm:             Appears normal         Bladder:                Appears normal  Stomach:               Appears normal, left                         sided ---------------------------------------------------------------------- Impression  Limited exam due to vaginal bleeding  Good fetal movement and amniotic fluid.  Cephlic presentation  No clear evidence of placental abruption observed. ---------------------------------------------------------------------- Recommendations  Clinical correlation recommended. ----------------------------------------------------------------------              Nathanel Fetters, MD Electronically Signed Final Report   01/05/2024 02:14 pm ----------------------------------------------------------------------   US  MFM OB LIMITED Result Date: 12/31/2023 ----------------------------------------------------------------------  OBSTETRICS REPORT                       (Signed Final 12/31/2023 04:02 pm) ---------------------------------------------------------------------- Patient Info  ID #:       [id number]                          D.O.B.:  1991/01/13 (32 yrs)(F)  Name:       TAWNI MOELLERS ANN               Visit Date: 12/31/2023 11:33 am              [facility]-TORRES ---------------------------------------------------------------------- Performed By   Attending:        Delora Smaller DO       Ref. Address:     [facility]  Performed By:     Powell Breen          Secondary Phy.:   [facility] MAU/Triage  RDMS  Referred By:      NORLEEN GAILS                 [facility] ---------------------------------------------------------------------- Orders  #  Description                           Code        Ordered By  1  US  MFM OB LIMITED                     [id number]    OVID ALL ----------------------------------------------------------------------  #  Order #                     Accession #                Episode #  1  [id number]                   [id number]                 [id number] ---------------------------------------------------------------------- Indications  [redacted] weeks gestation of pregnancy                Z3A.25  Abnormal fetal lie, antepartum                 O32.8XX0  Vaginal bleeding in pregnancy, second          O46.92  trimester  Poor obstetric history: Previous preterm       O09.219  delivery (31 weeks/short cervix/partial  abruption)  Obesity complicating pregnancy, second         O99.212  trimester (BMI 34)  History of cesarean delivery, currently        O34.219  pregnant ---------------------------------------------------------------------- Fetal Evaluation  Num Of Fetuses:         1  Fetal Heart Rate(bpm):  135  Cardiac Activity:       Observed  Presentation:           Breech  Placenta:               Anterior  P. Cord Insertion:      Previously seen  Amniotic Fluid  AFI FV:      Within normal limits                              Largest Pocket(cm)                              4.7 ---------------------------------------------------------------------- OB History  Blood Type:   O-  Maternal Racial/Ethnic Group:   White  Gravidity:    8         Term:   1        Prem:   1        SAB:   5  TOP:          0       Ectopic:  0  Living: 2  ---------------------------------------------------------------------- Gestational Age  LMP:           25w 6d        Date:  07/03/23                   EDD:   04/08/24  Best:          25w 1d     Det. By:  Early Ultrasound         EDD:   04/13/24                                      (08/14/23) ---------------------------------------------------------------------- Anatomy  Cranium:               Appears normal         Stomach:                Appears normal, left                                                                        sided  Ventricles:            Appears normal         Kidneys:                Appear normal  Thoracic:              Appears normal         Bladder:                Appears normal  Diaphragm:             Appears normal ---------------------------------------------------------------------- Cervix Uterus Adnexa  Cervix  Not visualized (advanced GA >24wks)  Uterus  No abnormality visualized.  Right Ovary  Not visualized.  Left Ovary  Not visualized.  Cul De Sac  No free fluid seen.  Adnexa  No abnormality visualized ---------------------------------------------------------------------- Comments  Hospital Ultrasound  The patient is admitted for vaginal bleeding  Sonographic findings  Single intrauterine pregnancy at 25w 1d  Fetal cardiac activity: Observed and appears normal.  Presentation: Breech.  Limited fetal anatomy appears normal.  Amniotic fluid: Within normal limits.  MVP: 4.7 cm.  Placenta: Anterior. There is no sonographic evidence of  bleeding.  Recommendations  - Continue inpatient care. Tocolysis can be used cautiously  as long as her blood pressure remains normal. Currently her  blood pressure is on the lower end of normal and tocolysis  should likely be avoided. Magnesium  sulfate is reserved for if  delivery is thought to be imminent for fetal neuroprotection.  She is also s/p betamethasone  and tocolysis should not be  given (typically) after the steroid window since they are   ineffective and have potential side effects, especially in the  setting of an abruption.  This was a limited ultrasound with a remote read. If an official  MFM consult is requested for any reason please call/place an  order in Epic. ----------------------------------------------------------------------                 Delora Smaller, DO Electronically Signed Final Report   12/31/2023 04:02 pm ----------------------------------------------------------------------  MAU Management/MDM: Orders Placed This Encounter  Procedures   Discharge patient Discharge disposition: 01-Home or Self Care; Discharge patient date: 01/25/2024   Discharge patient Discharge disposition: 01-Home or Self Care; Discharge patient date: 01/25/2024    No orders of the defined types were placed in this encounter.    Available prenatal records reviewed.  ASSESSMENT 1. Postpartum state   2. Skin wound from surgical incision    Reassurance provided. No signs of infection.  Education provided about keeping incision dry and clean.  Pads and tape provided to protect against itching.   PLAN Discharge home with strict return precautions. Allergies as of 01/25/2024       Reactions   Nsaids    History of gastric sleeve surgery Add to allergy list per pharmacy to alert providers**   Progesterone  Swelling   Vaginal swelling when taken vaginally   Aripiprazole Rash   Prenatal Vitamins Rash   States she got a rash from taking the prenatal the hospital offers    Sulfa Antibiotics Rash        Medication List     TAKE these medications    acetaminophen  500 MG tablet Commonly known as: TYLENOL  Take 2 tablets (1,000 mg total) by mouth every 6 (six) hours.   buPROPion  150 MG 24 hr tablet Commonly known as: Wellbutrin  XL Take 1 tablet (150 mg total) by mouth daily.   cyclobenzaprine  5 MG tablet Commonly known as: FLEXERIL  Take 1 tablet (5 mg total) by mouth 3 (three) times daily as needed for muscle spasms.    gabapentin  100 MG capsule Commonly known as: NEURONTIN  Take 1 capsule (100 mg total) by mouth every 8 (eight) hours as needed (burning).   metroNIDAZOLE  500 MG tablet Commonly known as: FLAGYL  Take 1 tablet (500 mg total) by mouth 2 (two) times daily.   oxyCODONE  5 MG immediate release tablet Commonly known as: Oxy IR/ROXICODONE  Take 1 tablet (5 mg total) by mouth every 6 (six) hours as needed (pain scale 4-7).   senna-docusate 8.6-50 MG tablet Commonly known as: Senokot-S Take 2 tablets by mouth daily.        Follow-up Information     Henry Slough, MD. Go to.   Specialty: Obstetrics and Gynecology Why: Routine postpartum care Contact information: [address] [place] [address] [phone]                Mardy Shropshire, MD FMOB Fellow, Faculty practice [facility]  01/25/2024  4:55 PM

## 2024-02-09 NOTE — Lactation Note (Signed)
 This note was copied from a baby's chart.  NICU Lactation Consultation Note  Patient Name: Kristen Padilla Date: 02/09/2024 Age:32 wk.o.  Reason for consult: Weekly NICU follow-up; NICU baby; Exclusive pumping and bottle feeding; Preterm <34wks; Infant < 5lbs  SUBJECTIVE Visited with family of 11 18/72 weeks old AGA NICU female Kristen Padilla; Kristen Padilla is a P3 and reported she has slowed down on her pumping because she's getting ready to quit. She has a stash of frozen breastmilk at home and also in the INC. She pumped about 20 oz of EBM the last 24 hours, praised her for all her efforts. Revised storage guidelines for frozen milk and what to do if her breast start acting up; she mentioned she felt a clogged duct, revised prevention/treatment of those. She knows how to increase her supply with power pumping in case she changes her mind and decides to continue pumping, Discussed pumping at her own pace and quite gradually if that's within her plans; she said she'll think about it.  Her goal is to keep baby Kristen Padilla on breastmilk until 34 weeks AGA.  OBJECTIVE Infant data: Mother's Current Feeding Choice: Breast Milk  O2 Device: CPAP FiO2 (%): 35 %  Infant feeding assessment IDFTS - Readiness: 5   Maternal data: H1E8746 C-Section, Low Transverse Pumping frequency: 1-2 times/24 hours Pumped volume: 60 mL  WIC Program: Yes WIC Referral Sent?: Yes What county?: [place] Pump: Received Stork Pump, Personal, Hands Free (Medela MaxFlow, Mom Cozy wearable)  ASSESSMENT Infant: Feeding Status: Scheduled 8-11-2-5 Feeding method: Tube/Gavage (Bolus)  Maternal: Milk volume: Low  INTERVENTIONS/PLAN Interventions: Discharge Education: Engorgement and breast care  Plan: Pump at her own pace; if she changes her mind, she understands pumping more often and power pumping will help bring the supply Apply ice packs if breasts become uncomfortable; heat/cold if plug ducts  are suspected  No other support person at this time. All questions and concerns answered, family to contact [facility] services PRN.  Consult Status: NICU follow-up NICU Follow-up type: Weekly NICU follow up   Kristen Padilla 02/09/2024, 6:37 PM

## 2024-02-21 NOTE — Lactation Note (Signed)
 This note was copied from a baby's chart.  NICU Lactation Consultation Note  Patient Name: Kristen Padilla Unijb'd Date: 02/21/2024 Age:32 wk.o.  Reason for consult: Weekly NICU follow-up; NICU baby; Exclusive pumping and bottle feeding; Preterm <34wks; Infant < 5lbs  SUBJECTIVE Visited with family of 73 32/38 weeks old AGA NICU female [name]; Lenka is a P3 and reported she's no longer pumping, she stopped about 2 weeks ago; no S/S of engorgement at this time. She voiced she still has plenty of EBM for baby when it's time to transfer him out of the hospital, looking options available at [place], [facility] or [place]. Lactation services are completed at this time.  OBJECTIVE Infant data: No data recorded O2 Device: Other (Comment) (NIPPV) FiO2 (%): 21 %  Infant feeding assessment IDFTS - Readiness: 5   Maternal data: H1E8746 C-Section, Low Transverse No data recorded WIC Program: Yes WIC Referral Sent?: Yes What county?: [place] Pump: Received [place] Pump, Personal, Hands Free (Medela MaxFlow, Mom Cozy wearable)  ASSESSMENT Infant: Feeding Status: Continuous gastric feedings Feeding method: Continuous Gastric  Maternal: No longer pumping  INTERVENTIONS/PLAN Interventions: Interventions: Education  Plan: Consult Status: Complete   Melquiades Kovar S Jaydin Boniface 02/21/2024, 7:05 PM

## 2024-02-27 ENCOUNTER — Emergency Department (HOSPITAL_COMMUNITY)

## 2024-02-27 ENCOUNTER — Emergency Department (HOSPITAL_COMMUNITY)
Admission: EM | Admit: 2024-02-27 | Discharge: 2024-02-27 | Disposition: A | Discharge status: Home / Self Care | Attending: Emergency Medicine | Admitting: Emergency Medicine

## 2024-02-27 ENCOUNTER — Encounter (HOSPITAL_COMMUNITY): Payer: Self-pay

## 2024-02-27 DIAGNOSIS — S0083XA Contusion of other part of head, initial encounter: Secondary | ICD-10-CM | POA: Insufficient documentation

## 2024-02-27 DIAGNOSIS — R6884 Jaw pain: Secondary | ICD-10-CM | POA: Diagnosis present
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
## 2024-02-27 NOTE — Discharge Instructions (Addendum)
 Follow up with Dr. Helga for worsening or persistent symptoms. Home to rest. Ice pack to face for 20 minutes at a time. Tylenol  as needed as directed.
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
## 2024-05-04 ENCOUNTER — Other Ambulatory Visit: Payer: Self-pay | Admitting: Obstetrics and Gynecology

## 2024-05-04 ENCOUNTER — Institutional Professional Consult (permissible substitution)

## 2024-05-04 DIAGNOSIS — Z8759 Personal history of other complications of pregnancy, childbirth and the puerperium: Secondary | ICD-10-CM

## 2024-05-21 ENCOUNTER — Inpatient Hospital Stay (HOSPITAL_COMMUNITY)
Admission: AD | Admit: 2024-05-21 | Discharge: 2024-05-21 | Disposition: A | Discharge status: Home / Self Care | Attending: Obstetrics and Gynecology | Admitting: Obstetrics and Gynecology

## 2024-05-21 ENCOUNTER — Encounter (HOSPITAL_COMMUNITY): Payer: Self-pay | Admitting: *Deleted

## 2024-05-21 DIAGNOSIS — B3731 Acute candidiasis of vulva and vagina: Secondary | ICD-10-CM | POA: Diagnosis not present

## 2024-05-21 DIAGNOSIS — O09291 Supervision of pregnancy with other poor reproductive or obstetric history, first trimester: Secondary | ICD-10-CM | POA: Insufficient documentation

## 2024-05-21 DIAGNOSIS — Z3A11 11 weeks gestation of pregnancy: Secondary | ICD-10-CM | POA: Insufficient documentation

## 2024-05-21 DIAGNOSIS — O99341 Other mental disorders complicating pregnancy, first trimester: Secondary | ICD-10-CM | POA: Insufficient documentation

## 2024-05-21 DIAGNOSIS — O4591 Premature separation of placenta, unspecified, first trimester: Secondary | ICD-10-CM | POA: Diagnosis not present

## 2024-05-21 DIAGNOSIS — O99211 Obesity complicating pregnancy, first trimester: Secondary | ICD-10-CM | POA: Diagnosis present

## 2024-05-21 DIAGNOSIS — O98811 Other maternal infectious and parasitic diseases complicating pregnancy, first trimester: Secondary | ICD-10-CM | POA: Diagnosis not present

## 2024-05-21 DIAGNOSIS — O23591 Infection of other part of genital tract in pregnancy, first trimester: Secondary | ICD-10-CM | POA: Diagnosis not present

## 2024-05-21 DIAGNOSIS — O321XX0 Maternal care for breech presentation, not applicable or unspecified: Secondary | ICD-10-CM | POA: Insufficient documentation

## 2024-05-21 LAB — WET PREP, GENITAL
Clue Cells Wet Prep HPF POC: NONE SEEN
Sperm: NONE SEEN
Trich, Wet Prep: NONE SEEN
WBC, Wet Prep HPF POC: >=10 — AB (ref <10)

## 2024-05-21 LAB — URINALYSIS, ROUTINE W REFLEX MICROSCOPIC
Bilirubin Urine: NEGATIVE
Glucose, UA: NEGATIVE mg/dL
Hgb urine dipstick: NEGATIVE
Ketones, ur: NEGATIVE mg/dL
Nitrite: NEGATIVE
Protein, ur: NEGATIVE mg/dL
Specific Gravity, Urine: 1.029 (ref 1.005–1.030)
pH: 5 (ref 5.0–8.0)

## 2024-05-21 MED ORDER — MICONAZOLE NITRATE 2 % VA CREA: 1.0000 | TOPICAL_CREAM | Freq: Every day | VAGINAL | 2 refills | Status: AC

## 2024-05-21 NOTE — MAU Note (Signed)
 Pt says she gets [facility]- [facility] Has pink D/C when she wipes - started today -  Has had a yeast infection and BV- still taking BV meds-  But Wed D/C turned green- she called office yesterday - but noone has returned call.  Still has vag itching. Cramping started today 10/10.   At 6pm- took XS Tyl  1 tab .  Now pain is 7/10

## 2024-05-21 NOTE — MAU Provider Note (Signed)
 Chief Complaint:  Abdominal Pain, Vaginal Bleeding, and Vaginal Discharge   HPI   None     Kristen Padilla is a 33 y.o. [id number] at [redacted]w[redacted]d who presents to maternity admissions reporting ***.   Pregnancy Course: ***  Past Medical History:  Diagnosis Date   Anxiety, generalized    Bupropion  overdose 01/12/2023   Wellbutrin  OD suicide attempt.     Closed fracture of left distal radius 03/29/2020   Added automatically from request for surgery [id number]     Depression    Leukocytosis 05/21/2020   Miscarriage    Morbid obesity (HCC)    Overdose 05/22/2020   Placental abruption in third trimester 06/28/2022   Second trimester bleeding 06/17/2022   Suicidal behavior with attempted self-injury (HCC) 2014   ibuprofen  OD   Suicidal behavior with attempted self-injury (HCC) 2022   tylenol  and NyQuil OD   Tylenol  overdose, intentional self-harm, initial encounter (HCC) 05/21/2020   UTI (urinary tract infection)    Vaginal bleeding in pregnancy, third trimester 06/27/2022   OB History  Gravida Para Term Preterm AB Living  9 3 1 2 5 3   SAB IAB Ectopic Multiple Live Births  5   0 3    # Outcome Date GA Lbr Len/2nd Weight Sex Type Anes PTL Lv  9 Current           8 Preterm 01/09/24 [redacted]w[redacted]d  1060 g M CS-LTranv Spinal  LIV  7 Preterm 06/29/22 [redacted]w[redacted]d  1910 g M CS-LTranv Gen  LIV  6 Term 12/14/10    F Vag-Spont   LIV     Complications: Shoulder Dystocia  5 SAB           4 SAB           3 SAB           2 SAB           1 SAB             Obstetric Comments  2023 breech, partial abruption   Past Surgical History:  Procedure Laterality Date   CESAREAN SECTION N/A 06/29/2022   Procedure: CESAREAN SECTION;  Surgeon: Gloriann Chick, MD;  Location: [facility];  Service: Obstetrics;  Laterality: N/A;   CESAREAN SECTION N/A 01/09/2024   Procedure: CESAREAN DELIVERY;  Surgeon: Henry Slough, MD;  Location: [facility];  Service: Obstetrics;  Laterality: N/A;   HERNIA REPAIR      LAPAROSCOPIC GASTRIC SLEEVE RESECTION     Repair of displaced fracture of the left radius  03/2020   Family History  Problem Relation Age of Onset   Diabetes Mother    Hypertension Mother    Depression Mother    Heart attack Mother    Hypertension Father    Social History   Tobacco Use   Smoking status: Never   Smokeless tobacco: Never  Vaping Use   Vaping status: Never Used  Substance Use Topics   Alcohol use: No   Drug use: No   Allergies  Allergen Reactions   Nsaids     History of gastric sleeve surgery Add to allergy list per pharmacy to alert providers**   Progesterone  Swelling    Vaginal swelling when taken vaginally   Aripiprazole Rash   Prenatal Vitamins Rash    States she got a rash from taking the prenatal the hospital offers    Sulfa Antibiotics Rash   No medications prior to admission.    I have reviewed  patient's Past Medical Hx, Surgical Hx, Family Hx, Social Hx, medications and allergies.   ROS  Pertinent items noted in HPI and remainder of comprehensive ROS otherwise negative.   PHYSICAL EXAM  Patient Vitals for the past 24 hrs:  BP Temp Temp src Pulse Resp Height Weight  05/21/24 2006 115/72 98.3 F (36.8 C) Oral 90 12 5' 2 (1.575 m) 82.2 kg    Constitutional: Well-developed, well-nourished female in no acute distress.  Cardiovascular: normal rate & rhythm, warm and well-perfused Respiratory: normal effort, no problems with respiration noted GI: Abd soft, non-tender, non-distended MS: Extremities nontender, no edema, normal ROM Neurologic: Alert and oriented x 4.  GU: no CVA tenderness Pelvic: normal external female genitalia, physiologic discharge, no blood, cervix clean.      Fetal Tracing: Baseline: Variability: Accelerations:  Decelerations: Toco:    Labs: Results for orders placed or performed during the hospital encounter of 05/21/24 (from the past 24 hours)  Wet prep, genital     Status: Abnormal   Collection Time: 05/21/24   8:12 PM  Result Value Ref Range   Yeast Wet Prep HPF POC PRESENT (A) NONE SEEN   Trich, Wet Prep NONE SEEN NONE SEEN   Clue Cells Wet Prep HPF POC NONE SEEN NONE SEEN   WBC, Wet Prep HPF POC >=10 (A) <10   Sperm NONE SEEN   Urinalysis, Routine w reflex microscopic -Urine, Clean Catch     Status: Abnormal   Collection Time: 05/21/24  8:16 PM  Result Value Ref Range   Color, Urine YELLOW YELLOW   APPearance CLOUDY (A) CLEAR   Specific Gravity, Urine 1.029 1.005 - 1.030   pH 5.0 5.0 - 8.0   Glucose, UA NEGATIVE NEGATIVE mg/dL   Hgb urine dipstick NEGATIVE NEGATIVE   Bilirubin Urine NEGATIVE NEGATIVE   Ketones, ur NEGATIVE NEGATIVE mg/dL   Protein, ur NEGATIVE NEGATIVE mg/dL   Nitrite NEGATIVE NEGATIVE   Leukocytes,Ua LARGE (A) NEGATIVE   RBC / HPF 0-5 0 - 5 RBC/hpf   WBC, UA 21-50 0 - 5 WBC/hpf   Bacteria, UA FEW (A) NONE SEEN   Squamous Epithelial / HPF 0-5 0 - 5 /HPF   Mucus PRESENT    Ca Oxalate Crys, UA PRESENT     Imaging:  No results found.  MDM & MAU COURSE  MDM: {TSMDM:33451}  MAU Course: Orders Placed This Encounter  Procedures   Wet prep, genital   Urinalysis, Routine w reflex microscopic -Urine, Clean Catch   Discharge patient   Meds ordered this encounter  Medications   miconazole  (MONISTAT  7) 2 % vaginal cream    Sig: Place 1 Applicatorful vaginally at bedtime. Apply for seven nights    Dispense:  30 g    Refill:  2   *** ASSESSMENT   1. [redacted] weeks gestation of pregnancy   2. Yeast infection of the vagina     PLAN  Discharge home in stable condition with return precautions.  ***    Allergies as of 05/21/2024       Reactions   Nsaids    History of gastric sleeve surgery Add to allergy list per pharmacy to alert providers**   Progesterone  Swelling   Vaginal swelling when taken vaginally   Aripiprazole Rash   Prenatal Vitamins Rash   States she got a rash from taking the prenatal the hospital offers    Sulfa Antibiotics Rash         Medication List     STOP  taking these medications    buPROPion  150 MG 24 hr tablet Commonly known as: Wellbutrin  XL   cyclobenzaprine  5 MG tablet Commonly known as: FLEXERIL    gabapentin  100 MG capsule Commonly known as: NEURONTIN    metroNIDAZOLE  500 MG tablet Commonly known as: FLAGYL    oxyCODONE  5 MG immediate release tablet Commonly known as: Oxy IR/ROXICODONE    senna-docusate 8.6-50 MG tablet Commonly known as: Senokot-S       TAKE these medications    acetaminophen  500 MG tablet Commonly known as: TYLENOL  Take 2 tablets (1,000 mg total) by mouth every 6 (six) hours.   DULoxetine 60 MG capsule Commonly known as: CYMBALTA Take 60 mg by mouth daily.   miconazole  2 % vaginal cream Commonly known as: MONISTAT  7 Place 1 Applicatorful vaginally at bedtime. Apply for seven nights        Charlie Courts, MD  Family Medicine - Obstetrics Fellow

## 2024-05-24 LAB — GC/CHLAMYDIA PROBE AMP (~~LOC~~) NOT AT ARMC
Chlamydia: NEGATIVE
Comment: NEGATIVE
Comment: NORMAL
Neisseria Gonorrhea: NEGATIVE

## 2024-06-02 ENCOUNTER — Ambulatory Visit

## 2024-06-28 ENCOUNTER — Other Ambulatory Visit: Payer: Self-pay | Admitting: *Deleted

## 2024-06-28 ENCOUNTER — Other Ambulatory Visit: Payer: Self-pay | Admitting: Obstetrics and Gynecology

## 2024-06-28 ENCOUNTER — Ambulatory Visit

## 2024-06-28 ENCOUNTER — Ambulatory Visit: Attending: Obstetrics | Admitting: Obstetrics

## 2024-06-28 VITALS — BP 123/71 | HR 79

## 2024-06-28 DIAGNOSIS — Z363 Encounter for antenatal screening for malformations: Secondary | ICD-10-CM | POA: Diagnosis not present

## 2024-06-28 DIAGNOSIS — E669 Obesity, unspecified: Secondary | ICD-10-CM | POA: Diagnosis not present

## 2024-06-28 DIAGNOSIS — Z8759 Personal history of other complications of pregnancy, childbirth and the puerperium: Secondary | ICD-10-CM | POA: Diagnosis present

## 2024-06-28 DIAGNOSIS — O09299 Supervision of pregnancy with other poor reproductive or obstetric history, unspecified trimester: Secondary | ICD-10-CM

## 2024-06-28 DIAGNOSIS — N96 Recurrent pregnancy loss: Secondary | ICD-10-CM

## 2024-06-28 DIAGNOSIS — O09292 Supervision of pregnancy with other poor reproductive or obstetric history, second trimester: Secondary | ICD-10-CM | POA: Insufficient documentation

## 2024-06-28 DIAGNOSIS — O09212 Supervision of pregnancy with history of pre-term labor, second trimester: Secondary | ICD-10-CM | POA: Diagnosis not present

## 2024-06-28 DIAGNOSIS — O34219 Maternal care for unspecified type scar from previous cesarean delivery: Secondary | ICD-10-CM | POA: Insufficient documentation

## 2024-06-28 DIAGNOSIS — Z6741 Type O blood, Rh negative: Secondary | ICD-10-CM | POA: Diagnosis not present

## 2024-06-28 DIAGNOSIS — O09899 Supervision of other high risk pregnancies, unspecified trimester: Secondary | ICD-10-CM

## 2024-06-28 DIAGNOSIS — O99842 Bariatric surgery status complicating pregnancy, second trimester: Secondary | ICD-10-CM | POA: Insufficient documentation

## 2024-06-28 DIAGNOSIS — O26872 Cervical shortening, second trimester: Secondary | ICD-10-CM | POA: Diagnosis not present

## 2024-06-28 DIAGNOSIS — O2622 Pregnancy care for patient with recurrent pregnancy loss, second trimester: Secondary | ICD-10-CM

## 2024-06-28 DIAGNOSIS — O99342 Other mental disorders complicating pregnancy, second trimester: Secondary | ICD-10-CM | POA: Insufficient documentation

## 2024-06-28 DIAGNOSIS — O99210 Obesity complicating pregnancy, unspecified trimester: Secondary | ICD-10-CM

## 2024-06-28 DIAGNOSIS — O99212 Obesity complicating pregnancy, second trimester: Secondary | ICD-10-CM | POA: Diagnosis not present

## 2024-06-28 DIAGNOSIS — Z3A17 17 weeks gestation of pregnancy: Secondary | ICD-10-CM

## 2024-06-28 DIAGNOSIS — Z903 Acquired absence of stomach [part of]: Secondary | ICD-10-CM

## 2024-06-28 DIAGNOSIS — Z362 Encounter for other antenatal screening follow-up: Secondary | ICD-10-CM

## 2024-06-28 NOTE — Progress Notes (Unsigned)
 MFM Consult Note  Kristen Padilla is currently at [redacted]w[redacted]d. She was seen today for a detailed fetal anatomy scan due to maternal obesity and a prior poor obstetrical history.    Her last 2 pregnancies were complicated by vaginal bleeding/placental abruption requiring cesarean deliveries at 31+ weeks and 26+ weeks.  This is a short interval pregnancy as her last cesarean delivery occurred in July 2025 (about 5 months ago).  She denies any problems in her current pregnancy and denies any vaginal bleeding..    She had a cell free DNA test earlier in her pregnancy which indicated a low risk for trisomy 43, 82, and 13. A female fetus is predicted.   Sonographic findings Single intrauterine pregnancy at 17w 2d. Fetal cardiac activity:  Observed and appears normal. Presentation: Cephalic. The views of the fetal anatomy were limited today due to her early gestational age.  What was visualized today appeared within normal limits. Fetal biometry shows the estimated fetal weight of 0 lb 8 oz, 217 grams (84%). Amniotic fluid: Within normal limits.  MVP: 4.13 cm. Placenta: Anterior. Adnexa: No abnormality visualized. A transvaginal ultrasound performed today showed a cervical length of 4.0 cm long without any signs of funneling. The patient was reassured that the anterior placenta appeared within normal limits.  There were no signs of placental abruption noted today.  Two prior pregnancies complicated by placental abruption requiring preterm deliveries  The patient was advised that the cause of placental abruption in her prior pregnancies remains undetermined.    She denies any falls or trauma to her abdomen prior to the onset of vaginal bleeding during her prior pregnancies.  She was reassured that there were no sonographic signs of placental abruption noted in the anterior placenta on today's exam.  Due to her history of placental abruption, we will continue to follow her with monthly  exams for fetal and placental assessment.  Consideration should be given for workup of the antiphospholipid antibody syndrome as the cause of her prior abruption.  She should have blood tests for IgM and IgG anticardiolipin antibodies, IgM and IgG antibeta-2 glycoprotein 1, and lupus anticoagulant drawn if she has not had these blood tests already.  The patient was advised that hopefully as she has not experienced any vaginal bleeding in her current pregnancy and is asymptomatic, we will be able to delay delivery for as long as possible (at 32 weeks or greater).    The patient's blood type is O-.  I have asked our genetic counselor to contact the lab [facility] and have the lab report the fetal Rh status.    Should her fetus be Rh+, she will require RhoGAM should she experience any vaginal bleeding, for the usual obstetrical indications, and after delivery.  She will return in 4 weeks for a detailed fetal anatomy scan.  The patient stated that all of her questions were answered.   A total of 30 minutes was spent counseling and coordinating the care for this patient.  Greater than 50% of the time was spent in direct face-to-face contact.

## 2024-07-29 ENCOUNTER — Other Ambulatory Visit: Payer: Self-pay | Admitting: *Deleted

## 2024-07-29 ENCOUNTER — Ambulatory Visit: Attending: Maternal & Fetal Medicine | Admitting: Maternal & Fetal Medicine

## 2024-07-29 ENCOUNTER — Ambulatory Visit

## 2024-07-29 VITALS — BP 123/66 | HR 87

## 2024-07-29 DIAGNOSIS — N96 Recurrent pregnancy loss: Secondary | ICD-10-CM | POA: Diagnosis present

## 2024-07-29 DIAGNOSIS — O09292 Supervision of pregnancy with other poor reproductive or obstetric history, second trimester: Secondary | ICD-10-CM | POA: Diagnosis not present

## 2024-07-29 DIAGNOSIS — O09212 Supervision of pregnancy with history of pre-term labor, second trimester: Secondary | ICD-10-CM | POA: Diagnosis not present

## 2024-07-29 DIAGNOSIS — Z3A21 21 weeks gestation of pregnancy: Secondary | ICD-10-CM

## 2024-07-29 DIAGNOSIS — O360120 Maternal care for anti-D [Rh] antibodies, second trimester, not applicable or unspecified: Secondary | ICD-10-CM

## 2024-07-29 DIAGNOSIS — O99212 Obesity complicating pregnancy, second trimester: Secondary | ICD-10-CM | POA: Insufficient documentation

## 2024-07-29 DIAGNOSIS — Z8759 Personal history of other complications of pregnancy, childbirth and the puerperium: Secondary | ICD-10-CM

## 2024-07-29 DIAGNOSIS — O99842 Bariatric surgery status complicating pregnancy, second trimester: Secondary | ICD-10-CM | POA: Insufficient documentation

## 2024-07-29 DIAGNOSIS — O99210 Obesity complicating pregnancy, unspecified trimester: Secondary | ICD-10-CM

## 2024-07-29 DIAGNOSIS — E669 Obesity, unspecified: Secondary | ICD-10-CM

## 2024-07-29 DIAGNOSIS — O09892 Supervision of other high risk pregnancies, second trimester: Secondary | ICD-10-CM | POA: Insufficient documentation

## 2024-07-29 DIAGNOSIS — Z903 Acquired absence of stomach [part of]: Secondary | ICD-10-CM

## 2024-07-29 DIAGNOSIS — Z362 Encounter for other antenatal screening follow-up: Secondary | ICD-10-CM | POA: Insufficient documentation

## 2024-07-29 DIAGNOSIS — Z363 Encounter for antenatal screening for malformations: Secondary | ICD-10-CM | POA: Insufficient documentation

## 2024-07-29 DIAGNOSIS — O99342 Other mental disorders complicating pregnancy, second trimester: Secondary | ICD-10-CM | POA: Diagnosis not present

## 2024-07-29 DIAGNOSIS — O34219 Maternal care for unspecified type scar from previous cesarean delivery: Secondary | ICD-10-CM | POA: Insufficient documentation

## 2024-07-29 DIAGNOSIS — O09899 Supervision of other high risk pregnancies, unspecified trimester: Secondary | ICD-10-CM

## 2024-07-29 DIAGNOSIS — O09299 Supervision of pregnancy with other poor reproductive or obstetric history, unspecified trimester: Secondary | ICD-10-CM

## 2024-07-29 DIAGNOSIS — O26872 Cervical shortening, second trimester: Secondary | ICD-10-CM | POA: Insufficient documentation

## 2024-07-29 DIAGNOSIS — O2622 Pregnancy care for patient with recurrent pregnancy loss, second trimester: Secondary | ICD-10-CM | POA: Insufficient documentation

## 2024-07-29 DIAGNOSIS — O09822 Supervision of pregnancy with history of in utero procedure during previous pregnancy, second trimester: Secondary | ICD-10-CM | POA: Diagnosis not present

## 2024-07-29 DIAGNOSIS — O360190 Maternal care for anti-D [Rh] antibodies, unspecified trimester, not applicable or unspecified: Secondary | ICD-10-CM | POA: Diagnosis present

## 2024-08-23 ENCOUNTER — Ambulatory Visit

## 2024-09-17 ENCOUNTER — Ambulatory Visit

## 2024-09-17 ENCOUNTER — Other Ambulatory Visit

## 2024-12-04 ENCOUNTER — Inpatient Hospital Stay (HOSPITAL_COMMUNITY): Admit: 2024-12-04
